# Patient Record
Sex: Female | Born: 1935
Health system: Southern US, Community
[De-identification: ages and names within clinical notes are randomized; demographics above are authoritative.]

## PROBLEM LIST (undated history)

## (undated) DIAGNOSIS — T4145XA Adverse effect of unspecified anesthetic, initial encounter: Secondary | ICD-10-CM

## (undated) DIAGNOSIS — K649 Unspecified hemorrhoids: Secondary | ICD-10-CM

## (undated) DIAGNOSIS — K219 Gastro-esophageal reflux disease without esophagitis: Secondary | ICD-10-CM

## (undated) DIAGNOSIS — K5792 Diverticulitis of intestine, part unspecified, without perforation or abscess without bleeding: Secondary | ICD-10-CM

## (undated) DIAGNOSIS — T8859XA Other complications of anesthesia, initial encounter: Secondary | ICD-10-CM

## (undated) DIAGNOSIS — I480 Paroxysmal atrial fibrillation: Secondary | ICD-10-CM

## (undated) DIAGNOSIS — F419 Anxiety disorder, unspecified: Secondary | ICD-10-CM

## (undated) DIAGNOSIS — E669 Obesity, unspecified: Secondary | ICD-10-CM

## (undated) DIAGNOSIS — I495 Sick sinus syndrome: Secondary | ICD-10-CM

## (undated) HISTORY — DX: Anxiety disorder, unspecified: F41.9

## (undated) HISTORY — PX: TONSILLECTOMY: SUR1361

## (undated) HISTORY — DX: Unspecified hemorrhoids: K64.9

## (undated) HISTORY — PX: APPENDECTOMY: SHX54

## (undated) HISTORY — DX: Sick sinus syndrome: I49.5

## (undated) HISTORY — DX: Gastro-esophageal reflux disease without esophagitis: K21.9

## (undated) HISTORY — DX: Obesity, unspecified: E66.9

## (undated) HISTORY — PX: KNEE SURGERY: SHX244

## (undated) HISTORY — PX: SHOULDER SURGERY: SHX246

## (undated) HISTORY — DX: Paroxysmal atrial fibrillation: I48.0

---

## 1971-03-18 HISTORY — PX: OTHER SURGICAL HISTORY: SHX169

## 1973-03-17 HISTORY — PX: ABDOMINAL HYSTERECTOMY: SHX81

## 1975-03-18 HISTORY — PX: BILATERAL TEMPOROMANDIBULAR JOINT ARTHROPLASTY: SUR77

## 1984-03-17 HISTORY — PX: OTHER SURGICAL HISTORY: SHX169

## 1994-03-17 HISTORY — PX: HERNIA REPAIR: SHX51

## 2002-04-07 ENCOUNTER — Encounter: Admission: RE | Admit: 2002-04-07 | Discharge: 2002-04-07 | Payer: Self-pay | Admitting: Orthopedic Surgery

## 2002-04-07 ENCOUNTER — Encounter: Payer: Self-pay | Admitting: Orthopedic Surgery

## 2002-05-25 ENCOUNTER — Ambulatory Visit (HOSPITAL_BASED_OUTPATIENT_CLINIC_OR_DEPARTMENT_OTHER): Admission: RE | Admit: 2002-05-25 | Discharge: 2002-05-25 | Payer: Self-pay | Admitting: Orthopedic Surgery

## 2002-05-30 ENCOUNTER — Emergency Department (HOSPITAL_COMMUNITY): Admission: EM | Admit: 2002-05-30 | Discharge: 2002-05-30 | Payer: Self-pay | Admitting: Emergency Medicine

## 2002-09-30 ENCOUNTER — Emergency Department (HOSPITAL_COMMUNITY): Admission: EM | Admit: 2002-09-30 | Discharge: 2002-09-30 | Payer: Self-pay | Admitting: *Deleted

## 2002-10-17 ENCOUNTER — Ambulatory Visit (HOSPITAL_COMMUNITY): Admission: RE | Admit: 2002-10-17 | Discharge: 2002-10-17 | Payer: Self-pay | Admitting: Family Medicine

## 2002-10-17 ENCOUNTER — Encounter: Payer: Self-pay | Admitting: Family Medicine

## 2002-10-20 ENCOUNTER — Encounter: Admission: RE | Admit: 2002-10-20 | Discharge: 2002-10-20 | Payer: Self-pay | Admitting: Family Medicine

## 2002-10-20 ENCOUNTER — Encounter: Payer: Self-pay | Admitting: Family Medicine

## 2004-10-12 ENCOUNTER — Inpatient Hospital Stay (HOSPITAL_COMMUNITY): Admission: EM | Admit: 2004-10-12 | Discharge: 2004-10-16 | Payer: Self-pay | Admitting: Internal Medicine

## 2004-10-15 ENCOUNTER — Encounter (INDEPENDENT_AMBULATORY_CARE_PROVIDER_SITE_OTHER): Payer: Self-pay | Admitting: *Deleted

## 2007-10-19 ENCOUNTER — Encounter: Admission: RE | Admit: 2007-10-19 | Discharge: 2007-10-19 | Payer: Self-pay | Admitting: Family Medicine

## 2008-02-13 ENCOUNTER — Emergency Department (HOSPITAL_COMMUNITY): Admission: EM | Admit: 2008-02-13 | Discharge: 2008-02-14 | Payer: Self-pay | Admitting: Emergency Medicine

## 2008-02-14 ENCOUNTER — Ambulatory Visit (HOSPITAL_COMMUNITY): Admission: RE | Admit: 2008-02-14 | Discharge: 2008-02-14 | Payer: Self-pay | Admitting: Emergency Medicine

## 2008-10-19 ENCOUNTER — Encounter: Admission: RE | Admit: 2008-10-19 | Discharge: 2008-10-19 | Payer: Self-pay | Admitting: Family Medicine

## 2009-02-04 ENCOUNTER — Inpatient Hospital Stay (HOSPITAL_COMMUNITY): Admission: EM | Admit: 2009-02-04 | Discharge: 2009-02-07 | Payer: Self-pay | Admitting: Emergency Medicine

## 2010-04-07 ENCOUNTER — Encounter: Payer: Self-pay | Admitting: Family Medicine

## 2010-06-19 LAB — HEMOGLOBIN A1C: Mean Plasma Glucose: 134 mg/dL

## 2010-06-19 LAB — BASIC METABOLIC PANEL
BUN: 11 mg/dL (ref 6–23)
BUN: 7 mg/dL (ref 6–23)
CO2: 20 mEq/L (ref 19–32)
Calcium: 7.8 mg/dL — ABNORMAL LOW (ref 8.4–10.5)
Chloride: 113 mEq/L — ABNORMAL HIGH (ref 96–112)
Chloride: 113 mEq/L — ABNORMAL HIGH (ref 96–112)
Creatinine, Ser: 1.01 mg/dL (ref 0.4–1.2)
Creatinine, Ser: 1.19 mg/dL (ref 0.4–1.2)
GFR calc Af Amer: 54 mL/min — ABNORMAL LOW (ref >60)
GFR calc Af Amer: >60 mL/min (ref >60)
GFR calc non Af Amer: 54 mL/min — ABNORMAL LOW (ref >60)
Glucose, Bld: 115 mg/dL — ABNORMAL HIGH (ref 70–99)
Potassium: 4.5 mEq/L (ref 3.5–5.1)
Potassium: 4.8 mEq/L (ref 3.5–5.1)
Sodium: 138 mEq/L (ref 135–145)

## 2010-06-19 LAB — RENAL FUNCTION PANEL
Albumin: 2.8 g/dL — ABNORMAL LOW (ref 3.5–5.2)
BUN: 15 mg/dL (ref 6–23)
CO2: 23 mEq/L (ref 19–32)
Calcium: 7.5 mg/dL — ABNORMAL LOW (ref 8.4–10.5)
Chloride: 111 mEq/L (ref 96–112)
Creatinine, Ser: 1.24 mg/dL — ABNORMAL HIGH (ref 0.4–1.2)
GFR calc Af Amer: 52 mL/min — ABNORMAL LOW (ref >60)
GFR calc non Af Amer: 43 mL/min — ABNORMAL LOW (ref >60)
Sodium: 137 mEq/L (ref 135–145)

## 2010-06-19 LAB — DIFFERENTIAL
Basophils Absolute: 0 10*3/uL (ref 0.0–0.1)
Eosinophils Relative: 0 % (ref 0–5)
Monocytes Relative: 5 % (ref 3–12)

## 2010-06-19 LAB — URINALYSIS, ROUTINE W REFLEX MICROSCOPIC
Glucose, UA: NEGATIVE mg/dL
Hgb urine dipstick: NEGATIVE
Leukocytes, UA: NEGATIVE
Specific Gravity, Urine: 1.028 (ref 1.005–1.030)
Urobilinogen, UA: 0.2 mg/dL (ref 0.0–1.0)

## 2010-06-19 LAB — POCT I-STAT, CHEM 8
BUN: 23 mg/dL (ref 6–23)
Calcium, Ion: 1.23 mmol/L (ref 1.12–1.32)
Creatinine, Ser: 1.5 mg/dL — ABNORMAL HIGH (ref 0.4–1.2)
Sodium: 142 mEq/L (ref 135–145)
TCO2: 23 mmol/L (ref 0–100)

## 2010-06-19 LAB — COMPREHENSIVE METABOLIC PANEL
ALT: 34 U/L (ref 0–35)
AST: 31 U/L (ref 0–37)
Albumin: 4.6 g/dL (ref 3.5–5.2)
Alkaline Phosphatase: 58 U/L (ref 39–117)
BUN: 24 mg/dL — ABNORMAL HIGH (ref 6–23)
Creatinine, Ser: 1.87 mg/dL — ABNORMAL HIGH (ref 0.4–1.2)
GFR calc non Af Amer: 27 mL/min — ABNORMAL LOW (ref >60)
Glucose, Bld: 161 mg/dL — ABNORMAL HIGH (ref 70–99)
Potassium: 3.7 mEq/L (ref 3.5–5.1)
Total Bilirubin: 0.6 mg/dL (ref 0.3–1.2)
Total Protein: 8.6 g/dL — ABNORMAL HIGH (ref 6.0–8.3)

## 2010-06-19 LAB — CBC
MCHC: 33.2 g/dL (ref 30.0–36.0)
MCHC: 34 g/dL (ref 30.0–36.0)
MCV: 96.4 fL (ref 78.0–100.0)
RBC: 5.08 MIL/uL (ref 3.87–5.11)
RDW: 13.7 % (ref 11.5–15.5)
WBC: 16.4 10*3/uL — ABNORMAL HIGH (ref 4.0–10.5)

## 2010-06-19 LAB — URINE MICROSCOPIC-ADD ON

## 2010-08-02 NOTE — Discharge Summary (Signed)
NAMEKAIDANCE, PANTOJA              ACCOUNT NO.:  [id number]   MEDICAL RECORD NO.:  [id number]          PATIENT TYPE:  INP   LOCATION:  5712                         FACILITY:  [facility]   PHYSICIAN:  Corinna L. Conley Canal, MDDATE OF BIRTH:  [birth year]   DATE OF ADMISSION:  10/12/2004  DATE OF DISCHARGE:  10/16/2004                                 DISCHARGE SUMMARY   DIAGNOSES:  1.  Right abdominal pain, improved.  2.  Mild right hydronephrosis without evidence of obstructing mass.  3.  Colitis at the biopsy of the anastomosis.  4.  History of anxiety disorder.  5.  History of peripheral edema.  6.  History of colectomy.   DISCHARGE MEDICATIONS:  She is to continue her Toprol, Paxil, triamterene.   FOLLOW UP:  Follow up with Dr. Jake Bathe in four to six weeks. Follow up with  Dr. Hessie Diener for further workup and treatment of her right  hydronephrosis.   CONDITION:  Stable.   ACTIVITY:  Ad lib.   DIET:  Low salt.   PERTINENT LABORATORY DATA:  UA negative for blood, nitrites or leukocyte  esterase. Complete metabolic panel unremarkable. Coagulation panel normal.  CBC normal. Hemoccult negative. Abdominal ultrasound showed no gallstones,  moderate hydronephrosis of the right kidney and 7 centimeter cyst in the  liver. CT of the abdomen showed status post partial colectomy, mild right  hydronephrosis without any obstructing stone or mass.   HISTORY AND HOSPITAL COURSE:  Ms. Genna was very pleasant 75 year old  white female patient of Dr. Bradd Burner who presented with right-sided abdominal  pain. She had no dysuria. Please see H&P for complete details. GI was  consulted and performed a flexible sigmoidoscopy to 18 cm. There was an  inflammation at the anastomosis site and this was biopsied. It showed  nonspecific colitis. The patient's abdominal pain resolved with conservative  therapy alone. In discussion with GI and urology, the patient could be  discharged to home and does not need  a  treatment for the nonspecific colitis but Dr. Terance Hart does recommend  outpatient workup of her hydronephrosis. At this point, it is still unclear  what caused her right-sided abdominal pain. Nevertheless, it has resolved  and the patient is stable to go home.      Corinna L. Conley Canal, MD  Electronically Signed     CLS/MEDQ  D:  10/20/2004  T:  10/20/2004  Job:  [id number]   cc:   Janifer Adie, M.D.  [place]  [place] [address]  Fax: [place] Terance Hart, M.D.  [place]. [address] [place]  [place] [address]  Fax: [phone]

## 2010-08-02 NOTE — H&P (Signed)
Rebecca Sandoval, Rebecca Sandoval              ACCOUNT NO.:  [id number]   MEDICAL RECORD NO.:  [id number]          PATIENT TYPE:  INP   LOCATION:  5712                         FACILITY:  [facility]   PHYSICIAN:  Domingo Madeira, MD   DATE OF BIRTH:  [birth year]   DATE OF ADMISSION:  10/12/2004  DATE OF DISCHARGE:                                HISTORY & PHYSICAL   HISTORY OF PRESENT ILLNESS:  Rebecca Sandoval is a 75 year old female who  started with right-sided abdominal at 1 p.m. today, achy with occasional  sharp, shooting pains.  This got progressively worse.  She is nauseated but  no vomiting.  She states that she has no similar issues before.  She has had  a bout of diverticulitis, but it is definitely not like that.  There has  been no change in her bowel habits.  She goes several times a day.  This is  not new since her colectomy from her diverticulitis.  No melena, no blood.  She has had some subjective fever and chills.  T-max is 99.8, increasing  pain with walking.   PAST MEDICAL HISTORY:  1.  Per patient, a fast heart rate.  2.  Lower extremity edema.  3.  Anxiety.   MEDICATIONS:  1.  Hydrochlorothiazide/triamterene 25/37.5 one p.o. daily.  2.  Paxil 20 mg one daily.  3.  Toprol XL 75 mg daily.   ALLERGIES:  None.   FAMILY HISTORY:  Hypertension, diabetes, and extensive cancers in the  family.   SOCIAL HISTORY:  The patient lives in [place], is married, has two  children.  No tobacco, alcohol, or illicit drug use.   PAST SURGICAL HISTORY:  1.  She has total TMJs.  2.  Bilateral shoulder surgery.  3.  Hiatal hernia repair.  4.  Diverticulitis surgery.  5.  Status post colectomy.  6.  Hysterectomy.  7.  Bartholin's cyst removal.   REVIEW OF SYSTEMS:  Please see HPI, otherwise all systems were negative when  reviewed with the patient.   PHYSICAL EXAMINATION:  VITAL SIGNS:  Temperature 98.2, blood pressure  148/78, pulse 73, respiratory rate 12, saturating 98%.  HEENT:  EOMI, no discharge.  Throat clear.  No erythema.  No exudate.  Mucosa  moist.  NECK:  Supple, no adenopathy, no JVD.  HEART:  Regular rate and rhythm.  No murmur, rubs, or gallops.  LUNGS:  Clear to auscultation bilaterally.  No CVA tenderness.  ABDOMEN:  Soft.  Hyperactive bowel sounds, right upper quadrant and the  right flank area had pain, some mild rebound tenderness.  No peritoneal  signs.  EXTREMITIES:  No edema, cyanosis, or clubbing.  NEUROLOGIC:  Patient was oriented x3.  Nonfocal.   LABORATORY DATA:  Labs from outside show white count of 15, hemoglobin of  15, hematocrit 45, platelets at 263.   X-ray of the chest did not show any active disease.  X-ray of the abdomen  showed some air fluid levels.   ASSESSMENT/PLAN:  1.  Abdominal pain, differential including partial small bowel obstruction,      ischemia, and questionable possible cholecystitis.  At this point n time      will check a CT of the abdomen and pelvis with oral and IV contrast,      contact surgery, check a CMET, n.p.o. except medications.  Continue to      observe the patient.  There is no need for NG tube at this point in      time.  The patient is not vomiting.  2.  History of peripheral edema.  Will hold on her      hydrochlorothiazide/triamterene.  3.  Anxiety.  Will continue with her Paxil.  4.  Prophylaxis.  Give her Lovenox subcu and Protonix daily.       AEJ/MEDQ  D:  10/12/2004  T:  10/12/2004  Job:  [id number]   cc:   Gwenyth Ober, M.D.  [address], [place]  [place] [address]

## 2010-08-02 NOTE — Op Note (Signed)
Rebecca Sandoval, Rebecca Sandoval              ACCOUNT NO.:  [id number]   MEDICAL RECORD NO.:  [id number]          PATIENT TYPE:  INP   LOCATION:  5712                         FACILITY:  [facility]   PHYSICIAN:  Wonda Horner, M.D.   DATE OF BIRTH:  20-Sep-1935   DATE OF PROCEDURE:  10/15/2004  DATE OF DISCHARGE:                                 OPERATIVE REPORT   PROCEDURE:  Flexible sigmoidoscopy with biopsy.   INDICATION:  [age]-year-old female with abdominal pain.   INFORMED CONSENT:  Informed consent was obtained after explanation of the  risks of bleeding, infection and perforation.   PREMEDICATION:  Fentanyl 50 mcg IV, Versed 5 mg IV.   PROCEDURE:  With the patient in the left lateral decubitus position, a  rectal exam was performed; no masses were felt.  The Olympus pediatric  colonoscope was inserted into the rectum and advanced up to 18 cm; at this  point, an anastomosis was seen.  This looked a little inflamed; it was  biopsied.  There were some diverticula distal to this in the remaining  sigmoid.  It appears that this patient has had a subtotaled colectomy.  No  other abnormalities were seen.  She tolerated the procedure well without  complications.   IMPRESSION:  1.  Diverticulosis.  2.  Anastomosis at 18 cm which looks a little inflamed; it was biopsied.       SFG/MEDQ  D:  10/15/2004  T:  10/16/2004  Job:  [id number]   cc:   Sheila Oats, M.D.

## 2010-08-02 NOTE — Op Note (Signed)
   NAME:  KHALEAH, DUER                        ACCOUNT NO.:  [id number]   MEDICAL RECORD NO.:  [id number]                   PATIENT TYPE:  AMB   LOCATION:  DSC                                  FACILITY:  [facility]   PHYSICIAN:  Yvette Rack., M.D.             DATE OF BIRTH:  [birth year]   DATE OF PROCEDURE:  DATE OF DISCHARGE:                                 OPERATIVE REPORT   PREOPERATIVE DIAGNOSIS:  1. Impingement, partial rotator cuff tear.  2. Torn anterior superior labrum.  3. Acromioclavicular arthritis.  4. Mild capsulitis.   POSTOPERATIVE DIAGNOSIS:  1. Impingement, partial rotator cuff tear.  2. Torn anterior superior labrum.  3. Acromioclavicular arthritis.  4. Mild capsulitis.   OPERATION PERFORMED:  1. Arthroscopic acromioplasty.  2. Arthroscopic debridement torn labrum.  3. Arthroscopic excision distal clavicle.  4. Closed manipulation.   SURGEON:  Lockie Pares, M.D.   ANESTHESIA:  General/block.   INDICATIONS FOR PROCEDURE:  Right shoulder pain with MRI proven cuff tear  thought to be amenable to outpatient arthroscopy with moderate capsulitis  thought to be amenable to outpatient manipulation with an overnight.   DESCRIPTION OF PROCEDURE:  The patient had very mild contractures with  limitation of external rotation and abduction and abduction but she was  gently manipulated.  There was not a severe capsulitis, however.  She was  arthroscoped in a posterolateral and anterior portal.  Systematic inspection  of the joint shows that the patient had a partial thickness tear about 30%  thickness of the supraspinatus.  This was debrided.  Degenerative type  tearing anterior superior labrum with debridement of the labrum.  No  glenohumeral changes.  The bursa was entered.  Debridement of the joint was  separate from the bursectomy carried out, very arthritic distal clavicle,  moderate impingement was noted.  Resection of the anterior leading edge of  the  acromion with a high speed bur, tapering gently posterior, leaving the  impingement nicely.  Distal centimeter to 1.5 cm of the clavicle was excised  in view of the acromioclavicular joint arthritis.  The superior surface of  the cuff did not show a complete tear.  Resection line  was checked from both the anterior and lateral portals.  Shoulder was closed  with nylon.  In view of the block, no Marcaine was used.  A lightly  compressive sterile dressing and sling was applied.  Taken to recovery room  in stable condition.                                                Yvette Rack., M.D.    WDC/MEDQ  D:  05/25/2002  T:  05/25/2002  Job:  [id number]

## 2010-08-02 NOTE — Consult Note (Signed)
Rebecca Sandoval, Rebecca Sandoval              ACCOUNT NO.:  [id number]   MEDICAL RECORD NO.:  [id number]          PATIENT TYPE:  INP   LOCATION:  5712                         FACILITY:  [facility]   PHYSICIAN:  John C. Amedeo Plenty, M.D.    DATE OF BIRTH:  05/28/1935   DATE OF CONSULTATION:  10/13/2004  DATE OF DISCHARGE:                                   CONSULTATION   REASON FOR CONSULTATION:  Abdominal pain.   Patient is a 75 year old white admitted with a fairly sudden onset of right  upper quadrant, right mid abdominal pain yesterday at about 1 p.m. while at  work.  She presented directly to the emergency room and had a normal  temperature, vital signs, white blood cell count, liver function test, plain  abdominal films and urinalysis but had significant tenderness with some  rebound in the right lower quadrant.  She underwent an abdominal CT scan  which showed previous partial colectomy, mild right hydronephrosis with no  sign of obstruction seen and pelvic CT was unremarkable.  It is not clear to  me whether she had a gallbladder ultrasound.  Her pain has been about 60%  improved from today to yesterday and she has not required pain medicines  today.   PAST MEDICAL HISTORY:  1.  Hiatal hernia.  2.  Anxiety.  3.  Degenerative joint disease.  4.  Diverticulitis.   SURGERIES:  1.  Hysterectomy in the 80s.  2.  Partial colectomy in the 80s for diverticulitis.  3.  Nissen fundoplication approximately 2000 with bilateral shoulder      surgery.  4.  Bilateral total TMJ repair.  5.  Bartholin's cyst removal.   MEDICATIONS ON ADMISSION:  Hydrochlorothiazide, Paxil, Toprol XL.   ALLERGIES:  NONE.   SOCIAL HISTORY:  Patient lives in [place].  She has 2 children.  She  denies alcohol or tobacco use.   PHYSICAL EXAM:  A well-developed, well-nourished white female in no acute  distress.  HEART:  Regular rate and rhythm without murmur.  LUNGS:  Clear.  ABDOMEN:  Soft, nondistended with  normoactive bowel sounds, no  hepatosplenomegaly or mass.  There is some tenderness in the right upper  quadrant but no rebound.   IMPRESSION:  Right sided abdominal pain, etiology unclear with negative  workup as outlined above.   PLAN:  Will confirm whether her gallbladder appeared normal on CT or was  adequately imaged and/or whether she had a gallbladder ultrasound and if not  will order one.  If no evidence of gallbladder disease, she has never had  full colonoscopy since her colectomy in the 1980s and would probably pursue  this.  Will obtain hemoccults in the meantime.       JCH/MEDQ  D:  10/13/2004  T:  10/13/2004  Job:  [id number]   cc:   Janifer Adie, M.D.  [place]  [place] [address]  Fax: [phone]

## 2010-12-04 ENCOUNTER — Ambulatory Visit (INDEPENDENT_AMBULATORY_CARE_PROVIDER_SITE_OTHER): Payer: 59 | Admitting: General Surgery

## 2010-12-17 ENCOUNTER — Encounter (INDEPENDENT_AMBULATORY_CARE_PROVIDER_SITE_OTHER): Payer: Self-pay | Admitting: General Surgery

## 2010-12-17 LAB — URINALYSIS, ROUTINE W REFLEX MICROSCOPIC
Bilirubin Urine: NEGATIVE
Glucose, UA: NEGATIVE mg/dL
Hgb urine dipstick: NEGATIVE
Ketones, ur: NEGATIVE mg/dL
Protein, ur: NEGATIVE mg/dL

## 2010-12-17 LAB — DIFFERENTIAL
Eosinophils Absolute: 0.1 10*3/uL (ref 0.0–0.7)
Eosinophils Relative: 1 % (ref 0–5)
Lymphocytes Relative: 21 % (ref 12–46)
Lymphs Abs: 2.1 10*3/uL (ref 0.7–4.0)
Monocytes Absolute: 0.6 10*3/uL (ref 0.1–1.0)

## 2010-12-17 LAB — URINE CULTURE: Colony Count: >=100000

## 2010-12-17 LAB — CBC
MCV: 95.4 fL (ref 78.0–100.0)
Platelets: 187 10*3/uL (ref 150–400)
RBC: 4.32 MIL/uL (ref 3.87–5.11)
WBC: 10.1 10*3/uL (ref 4.0–10.5)

## 2010-12-17 LAB — COMPREHENSIVE METABOLIC PANEL
ALT: 27 U/L (ref 0–35)
AST: 24 U/L (ref 0–37)
Albumin: 3.6 g/dL (ref 3.5–5.2)
CO2: 27 mEq/L (ref 19–32)
Calcium: 9.3 mg/dL (ref 8.4–10.5)
Chloride: 107 mEq/L (ref 96–112)
Creatinine, Ser: 1.13 mg/dL (ref 0.4–1.2)
GFR calc Af Amer: 58 mL/min — ABNORMAL LOW (ref >60)
Sodium: 139 mEq/L (ref 135–145)
Total Bilirubin: 0.5 mg/dL (ref 0.3–1.2)

## 2010-12-17 LAB — URINE MICROSCOPIC-ADD ON

## 2010-12-18 ENCOUNTER — Encounter (INDEPENDENT_AMBULATORY_CARE_PROVIDER_SITE_OTHER): Payer: Self-pay | Admitting: General Surgery

## 2010-12-18 ENCOUNTER — Ambulatory Visit (INDEPENDENT_AMBULATORY_CARE_PROVIDER_SITE_OTHER): Payer: 59 | Admitting: General Surgery

## 2010-12-18 VITALS — BP 140/94 | HR 64 | Temp 96.8°F | Resp 18 | Ht 66.5 in | Wt 199.5 lb

## 2010-12-18 DIAGNOSIS — K648 Other hemorrhoids: Secondary | ICD-10-CM

## 2010-12-18 DIAGNOSIS — K644 Residual hemorrhoidal skin tags: Secondary | ICD-10-CM

## 2010-12-18 DIAGNOSIS — L290 Pruritus ani: Secondary | ICD-10-CM

## 2010-12-18 NOTE — Progress Notes (Signed)
Subjective:     Patient ID: Rebecca Sandoval, female   DOB: [birth year], 75 y.o.   MRN: [id number]  HPIMrs. 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 He was hospitalized on a cone approximately year 4 months ago for  intractable nausea and vomiting and was evaluated with CT is an ultrasound of gallbladder did not show stones a flexible sigmoidoscopy mode with biopsy and this shows count of nonspecific colitis and her next low scissors of 18 cm per she said that he noted nausea and vomiting  Patient decided that he never felt that a definite diagnosis but she's, put up with the diarrhea is at sets been present ever a sensor total abdominal colectomy. She does have a rapid heartbeat for which he is on a beta blocker and she's never had any angina. She said her last chest x-ray and EKG was when she was in the hospital which is over a year ago.   Review of Systems  Current Outpatient Prescriptions  Medication Sig Dispense Refill  . magnesium gluconate (MAGONATE) 30 MG tablet Take 30 mg by mouth 2 (two) times daily.        . metoprolol (TOPROL-XL) 100 MG 24 hr tablet Take 50 mg by mouth daily.       Marland Kitchen PARoxetine (PAXIL) 20 MG tablet Take 20 mg by mouth every morning.        . potassium chloride (MICRO-K) 10 MEQ CR capsule Take 10 mEq by mouth 2 (two) times daily.        Marland Kitchen triamterene-hydrochlorothiazide (MAXZIDE-25) 37.5-25 MG per tablet Take 1 tablet by mouth daily.         Past Surgical History  Procedure Date  . Abdominal hysterectomy 1975  . Bartholyn cyst 1973  . Bilateral temporomandibular joint arthroplasty 1977    . Diverticulitis 1986  . Shoulder surgery     left 97, rigth 98  . Hernia repair 1996    hiatal   No Known Allergies  Patient denies ever having angina she doesn't have shortness of breath she has a small hiatus hernia her CT of the lobe of the mid esophageal reflux. She cannot for certain as to cover increased diarrhea up    Objective:   Physical ExamBP 140/94  Pulse 64  Temp 96.8 F (36 C)  Resp 18  Ht 5' 6.5" (1.689 m)  Wt 199 lb 8 oz (90.493 kg)  BMI 31.72 kg/m2 Patient is in no acute distress and is quite cooperative lungs are clear there's no wheezing normal heart size and did not do a breast exam and was nontender there's a midline incision and she says she was reoperated on for caliber hiatus hernia far 6 years after her colorectal surgery on rectal exam she's got a lot appearing ascending she's got a lot of external hemorrhoidal tags and anoscopic exam she's got significant internal and external hemorrhoid in 3 quadrants and really needs a formal internal and external hemorrhoidectomy. I would recommend that we do this as an outpatient since she's got a vascular short colon of a just a dose of milk of magnesia or possibly  clear liquids from lunch or one the day before surgery would be all she needs it she's never bothered with constipation. I would like to treat her with Mycolog cream to see if we can get this. His pain improves and do that for a couple weeks and then proceed on with an internal and external hemorrhoidectomy. She is aware that she'll be off work probably 2 weeks maybe 3 weeks before she is actually pain free and says his hemorrhoids which is getting worse and wants to proceed on. I think I will do a proctoscopy exam at the time of surgery since she's had a previous noncystic colitis and noted infected and there is nothing additionally from the thigh and appear LI family history of colon cancer worse her father her sister appear     Assessment:    That  internal/external hemorrhoids and a lot of hemorrhoidal tags and I significant perianal S. irritation from the chronic diarrhea    Plan:     2 weeks of Mycolog cream to 3 times a day and we will plan a formal internal and external hemorrhoidectomy as an outpatient and with a lump in the lithotomy position she had chest x-ray post laboratory studies when she was hospitalized for 60 months ago there was no evidence of anemia or really infection noted definite. Thank you

## 2010-12-18 NOTE — Patient Instructions (Signed)
Will start was treated in the pruritis ani with Mycolog cream applied b.i.d. after cleansing and and plan on formal internal and external hemorrhoidectomy in 2-3 weeks. We'll do this as an outpatient at [facility]

## 2010-12-20 ENCOUNTER — Other Ambulatory Visit (INDEPENDENT_AMBULATORY_CARE_PROVIDER_SITE_OTHER): Payer: Self-pay | Admitting: General Surgery

## 2010-12-20 ENCOUNTER — Encounter (HOSPITAL_COMMUNITY): Payer: Medicare Other

## 2010-12-20 ENCOUNTER — Ambulatory Visit (HOSPITAL_COMMUNITY)
Admission: RE | Admit: 2010-12-20 | Discharge: 2010-12-20 | Disposition: A | Discharge status: Home / Self Care | Payer: Medicare Other | Source: Ambulatory Visit | Attending: General Surgery | Admitting: General Surgery

## 2010-12-20 ENCOUNTER — Ambulatory Visit (INDEPENDENT_AMBULATORY_CARE_PROVIDER_SITE_OTHER): Payer: 59 | Admitting: General Surgery

## 2010-12-20 DIAGNOSIS — Z87891 Personal history of nicotine dependence: Secondary | ICD-10-CM | POA: Insufficient documentation

## 2010-12-20 DIAGNOSIS — Z01812 Encounter for preprocedural laboratory examination: Secondary | ICD-10-CM | POA: Insufficient documentation

## 2010-12-20 DIAGNOSIS — Z01818 Encounter for other preprocedural examination: Secondary | ICD-10-CM | POA: Insufficient documentation

## 2010-12-20 DIAGNOSIS — Z01811 Encounter for preprocedural respiratory examination: Secondary | ICD-10-CM

## 2010-12-20 DIAGNOSIS — K649 Unspecified hemorrhoids: Secondary | ICD-10-CM | POA: Insufficient documentation

## 2010-12-20 LAB — CBC
HCT: 40.3 % (ref 36.0–46.0)
MCH: 31.5 pg (ref 26.0–34.0)
MCHC: 33.3 g/dL (ref 30.0–36.0)
MCV: 94.6 fL (ref 78.0–100.0)
RDW: 12.8 % (ref 11.5–15.5)

## 2010-12-20 LAB — BASIC METABOLIC PANEL
BUN: 18 mg/dL (ref 6–23)
Calcium: 9.4 mg/dL (ref 8.4–10.5)
Creatinine, Ser: 1.39 mg/dL — ABNORMAL HIGH (ref 0.50–1.10)
GFR calc Af Amer: 42 mL/min — ABNORMAL LOW (ref >90)
GFR calc non Af Amer: 37 mL/min — ABNORMAL LOW (ref >90)

## 2010-12-20 LAB — DIFFERENTIAL
Eosinophils Absolute: 0.1 10*3/uL (ref 0.0–0.7)
Eosinophils Relative: 2 % (ref 0–5)
Lymphocytes Relative: 37 % (ref 12–46)
Lymphs Abs: 2.8 10*3/uL (ref 0.7–4.0)
Monocytes Absolute: 0.7 10*3/uL (ref 0.1–1.0)

## 2010-12-24 ENCOUNTER — Ambulatory Visit (HOSPITAL_COMMUNITY)
Admission: RE | Admit: 2010-12-24 | Discharge: 2010-12-24 | Disposition: A | Discharge status: Home / Self Care | Payer: Medicare Other | Source: Ambulatory Visit | Attending: General Surgery | Admitting: General Surgery

## 2010-12-24 ENCOUNTER — Other Ambulatory Visit (INDEPENDENT_AMBULATORY_CARE_PROVIDER_SITE_OTHER): Payer: Self-pay | Admitting: General Surgery

## 2010-12-24 DIAGNOSIS — K644 Residual hemorrhoidal skin tags: Secondary | ICD-10-CM | POA: Insufficient documentation

## 2010-12-24 DIAGNOSIS — I471 Supraventricular tachycardia, unspecified: Secondary | ICD-10-CM | POA: Insufficient documentation

## 2010-12-24 DIAGNOSIS — K648 Other hemorrhoids: Secondary | ICD-10-CM | POA: Insufficient documentation

## 2010-12-24 DIAGNOSIS — Z79899 Other long term (current) drug therapy: Secondary | ICD-10-CM | POA: Insufficient documentation

## 2010-12-24 DIAGNOSIS — E669 Obesity, unspecified: Secondary | ICD-10-CM | POA: Insufficient documentation

## 2010-12-24 DIAGNOSIS — I10 Essential (primary) hypertension: Secondary | ICD-10-CM | POA: Insufficient documentation

## 2010-12-28 ENCOUNTER — Emergency Department (HOSPITAL_COMMUNITY): Payer: Medicare Other

## 2010-12-28 ENCOUNTER — Emergency Department (HOSPITAL_COMMUNITY)
Admission: EM | Admit: 2010-12-28 | Discharge: 2010-12-28 | Disposition: A | Discharge status: Home / Self Care | Payer: Medicare Other | Attending: Internal Medicine | Admitting: Internal Medicine

## 2010-12-28 DIAGNOSIS — I4891 Unspecified atrial fibrillation: Secondary | ICD-10-CM | POA: Insufficient documentation

## 2010-12-28 DIAGNOSIS — Z79899 Other long term (current) drug therapy: Secondary | ICD-10-CM | POA: Insufficient documentation

## 2010-12-28 DIAGNOSIS — R0989 Other specified symptoms and signs involving the circulatory and respiratory systems: Secondary | ICD-10-CM | POA: Insufficient documentation

## 2010-12-28 DIAGNOSIS — I10 Essential (primary) hypertension: Secondary | ICD-10-CM | POA: Insufficient documentation

## 2010-12-28 DIAGNOSIS — R0609 Other forms of dyspnea: Secondary | ICD-10-CM | POA: Insufficient documentation

## 2010-12-28 DIAGNOSIS — K5730 Diverticulosis of large intestine without perforation or abscess without bleeding: Secondary | ICD-10-CM | POA: Insufficient documentation

## 2010-12-28 LAB — COMPREHENSIVE METABOLIC PANEL
Albumin: 3.7 g/dL (ref 3.5–5.2)
BUN: 15 mg/dL (ref 6–23)
CO2: 25 mEq/L (ref 19–32)
Chloride: 98 mEq/L (ref 96–112)
Creatinine, Ser: 1.17 mg/dL — ABNORMAL HIGH (ref 0.50–1.10)
GFR calc non Af Amer: 45 mL/min — ABNORMAL LOW (ref >90)
Total Bilirubin: 0.2 mg/dL — ABNORMAL LOW (ref 0.3–1.2)

## 2010-12-28 LAB — CBC
HCT: 40.5 % (ref 36.0–46.0)
MCV: 91.8 fL (ref 78.0–100.0)
RDW: 12.9 % (ref 11.5–15.5)
WBC: 9.8 10*3/uL (ref 4.0–10.5)

## 2010-12-28 LAB — POCT I-STAT TROPONIN I
Troponin i, poc: 0.01 ng/mL (ref 0.00–0.08)
Troponin i, poc: 0.01 ng/mL (ref 0.00–0.08)

## 2010-12-28 LAB — DIFFERENTIAL
Eosinophils Relative: 2 % (ref 0–5)
Lymphocytes Relative: 23 % (ref 12–46)
Lymphs Abs: 2.2 10*3/uL (ref 0.7–4.0)
Monocytes Absolute: 0.8 10*3/uL (ref 0.1–1.0)
Monocytes Relative: 8 % (ref 3–12)

## 2010-12-28 NOTE — Discharge Summary (Signed)
NAMEVALERY, Rebecca Sandoval NO.:  [id number]  MEDICAL RECORD NO.:  [id number]  LOCATION:  WLED                         FACILITY:  [facility]  PHYSICIAN:  Jacquelynn Cree, M.D.   DATE OF BIRTH:  [birth year]  DATE OF ADMISSION:  12/28/2010 DATE OF DISCHARGE:  12/28/2010                              DISCHARGE SUMMARY   PRIMARY CARE PHYSICIAN:  Dr. Grayland Ormond.  CARDIOLOGIST:  Belva Crome, M.D.  DISCHARGE DIAGNOSES: 1. Atrial fibrillation with rapid ventricular response. 2. Transient bradycardia. 3. Hypertension. 4. History of diverticulosis status post partial colectomy. 5. History of ventricular tachycardia. 6. Anxiety. 7. Hiatal hernia. 8. Degenerative joint disease. 9. Recent hemorrhoidectomy.  DISCHARGE MEDICATIONS: 1. Toprol 50 mg p.o. tonight only then discontinue. 2. Cardizem CD 240 mg p.o. starting December 29, 2010. 3. Hydrocodone APAP 5/500 one tab p.o. q.6 hours p.r.n. pain. 4. Maxzide 37.5/25 one tablet p.o. daily. 5. Paroxetine 20 mg p.o. daily.  CONSULTATIONS:  Jerline Pain, MD of Cardiology.  BRIEF ADMISSION HISTORY OF PRESENT ILLNESS:  The patient is a 75 year old female, who presented to the hospital with a chief complaint of palpitations and dizziness accompanied by diaphoresis and presyncope. She presented to the hospital where she was found to be in atrial fibrillation with rapid ventricular response.  She converted back to normal sinus rhythm.  But because of her tachyarrhythmia, was referred to the hospitalist service for further evaluation and treatment.  For the full details, please see my dictated H and P.  PROCEDURES AND DIAGNOSTIC STUDIES:  Chest x-ray on December 28, 2010, showed low volume film without acute cardiopulmonary process.  DISCHARGE LABORATORY VALUES:  Please see the dictated H and P for her CMP and CBC results.  Troponins were negative x2.  HOSPITAL COURSE BY PROBLEM: 1. Atrial fibrillation with rapid  ventricular response:  The patient     was admitted for observation and put on a Cardizem drip by the     emergency department physician.  By the time I evaluated her, she     was bradycardic in normal sinus rhythm.  A cardiology consultation     was requested and kindly provided by Dr. Marlou Porch who felt that the     patient could safely be discharged home.  He recommended that she     take her dose of metoprolol tonight but then transition over to     long-acting Cardizem in the morning since she converted rapidly     after being put on a Cardizem drip.  He has arranged for her to be     seen in the clinic in follow up for further evaluation.  Of note,     her troponins were negative. 2. Hypertension:  The patient will continue on     triamterene/hydrochlorothiazide and metoprolol tonight followed by     Cardizem in the morning.  The patient's other medical problems remained stable.  DISPOSITION:  The patient was medically stable and recommended to be discharged from the emergency department.  Time spent on discharge including face-to-face time equals less than 30 minutes.  Jacquelynn Cree, M.D.     CR/MEDQ  D:  12/28/2010  T:  12/28/2010  Job:  [id number]  cc:   Dr. Alric Seton, M.D. Fax: [phone]  Electronically Signed by Jacquelynn Cree M.D. on 12/28/2010 09:24:55 PM

## 2010-12-28 NOTE — H&P (Signed)
Rebecca Sandoval, KILLILEA NO.:  [id number]  MEDICAL RECORD NO.:  [id number]  LOCATION:  WLED                         FACILITY:  [facility]  PHYSICIAN:  Jacquelynn Cree, M.D.   DATE OF BIRTH:  05-25-1935  DATE OF ADMISSION:  12/28/2010 DATE OF DISCHARGE:                             HISTORY & PHYSICAL   PRIMARY CARE PHYSICIAN:  Dr. Grayland Sandoval.  CARDIOLOGIST:  Belva Crome, M.D.  CHIEF COMPLAINT:  Elevated heart rate, weakness, dizziness.  HISTORY OF PRESENT ILLNESS:  The patient is a 75 year old female followed by Dr. Tamala Julian for tachyarrhythmias, who presents to the hospital with a chief complaint of palpitations, dizziness, and feeling weak. The patient reports that she underwent hemorrhoidectomy approximately 4 days ago and was doing well postoperatively.  She was eating breakfast when she had the sudden onset of palpitations.  She states that she has had palpitations in the past and Dr. Tamala Julian has told her that when she feels this way to check her pulse rate and if it is ever greater than 120 beats per minute, to call his office or to come to the emergency department if it is after hours.  The patient took her pulse rate and it was found to be in the 130s-140s and therefore she came to the hospital for evaluation.  The palpitations were not brought on by exertion, however, they were associated with nausea and diaphoresis.  She denies any associated chest pain.  She has not had any fever or chills.  Upon initial evaluation in the emergency department, the patient was found to be in atrial fibrillation with rapid ventricular response.  A and subsequently was referred to the hospitalist service for further evaluation and treatment.  PAST MEDICAL HISTORY: 1. Hypertension. 2. Diverticulosis status post partial colectomy. 3. History of ventricular tachycardia. 4. Anxiety. 5. Hiatal hernia. 6. Degenerative joint disease. 7. Atrial  fibrillation/tachyarrhythmia. 8. Hemorrhoids status post hemorrhoidectomy.  PAST SURGICAL HISTORY: 1. Bartholin cyst removal. 2. Hemorrhoidectomy. 3. Hysterectomy. 4. Partial colectomy. 5. Nissen fundoplication. 6. Bilateral shoulder surgeries. 7. Bilateral TMJ repair.  FAMILY HISTORY:  Patient's mother died at 57 from a massive stroke.  She was also hypertensive.  Patient's father died at 50 from organ failure. She has 1 healthy brother and 2 offspring.  One of her children died from medication overdose.  SOCIAL HISTORY:  The patient is married x44 years.  She quit smoking 20 years ago but prior to this had a 2 pack per week habit.  She is currently employed as a Electrical engineer.  She very rarely drinks alcohol and does not use street drugs.  ALLERGIES:  No known drug allergies.  CURRENT MEDICATIONS: 1. Hydrocodone APAP 5/500 one tab p.o. q.6 hours p.r.n. pain. 2. Maxzide 37.5/25 1 tab p.o. daily. 3. Paroxetine 20 mg p.o. daily. 4. Metoprolol-XL 50 mg p.o. b.i.d.  REVIEW OF SYSTEMS:  CONSTITUTIONAL:  No fever or chills.  No weight loss.  Appetite is good.  HEENT:  No complaints.  CARDIOVASCULAR:  Chest pain.  Positive for arrhythmia.  RESPIRATORY:  Shortness of breath or cough.  GI:  No nausea, vomiting, chronic diarrhea but no melena or hematochezia.  GU:  No dysuria or hematuria.  MUSCULOSKELETAL: Arthritic type aches, but no other complaints.  A comprehensive 14-point review of systems is otherwise unremarkable.  PHYSICAL EXAMINATION:  VITAL SIGNS:  Temperature 98.3, pulse 73, respirations 23, blood pressure 146/84, O2 saturation 95% on 2 L. GENERAL:  Well-developed, well-nourished female no acute distress. HEENT:  Normocephalic, atraumatic.  PERRL.  EOMI.  Oropharynx is clear. NECK:  Supple, no thyromegaly, no lymphadenopathy, no jugular venous distention.  CHEST:  Lungs clear to auscultation bilaterally with good air movement.  No rales or rhonchi. HEART:   Currently in normal sinus rhythm with a regular rate.  Mildly bradycardic.  No murmurs, rubs, or gallops. ABDOMEN:  Soft, nontender, nondistended with normoactive bowel sounds. EXTREMITIES:  No clubbing, edema, or cyanosis.  Dorsalis pedis pulses 2+.  Skin:  Dry.  No rashes. NEUROLOGIC:  The patient is alert and oriented x3.  Cranial nerves 2-12 grossly intact.  Nonfocal.  DATA REVIEW:  Chest x-ray shows low volume film without acute pulmonary process.  LABORATORY DATA:  Sodium is 138, potassium 3.7, chloride 98, bicarb 25, BUN 15, creatinine 1.17, glucose 136, total bilirubin 0.2, alkaline phosphatase 54, AST 22, ALT 37, total protein 7.7, albumin 3.7, calcium 10.4.  White blood cell count is 9.8, hemoglobin 14, hematocrit 40.5, platelets 235  ASSESSMENT/PLAN: 1. Paroxysmal atrial fibrillation with rapid ventricular response:  We     will admit the patient for observation.  I have her ready spoke Dr.     Paulla Fore of cardiology who will consult on the patient.  He     does recommend withdrawal of the patient's beta-blocker and     initiation of Cardizem for control of these paroxysms of atrial fib.  At this     time, he does not recommend that we place the patient on Coumadin.     However, his further recommendations will be forthcoming.  The     patient has converted from AFib with rapid ventricular response to     bradycardia in a normal sinus rhythm on the Cardizem drip and I     have asked the nurses to discontinue the drip.  We will start her     on Cardizem 60 mg p.o. q.6 hours with holding parameters.  I will     also check her thyroid function, and cycle cardiac markers q.6     hours x3 sets.  If her heart rate is stable, she can likely be     discharged in the morning. 2. Stage 2 to 3 chronic kidney disease:  Patient's baseline creatinine     is 1.1-1.39.  This is consistent with a reduced GFR and stage 3     chronic kidney disease.  Would recommend outpatient follow  up. 3. Hypertension:  We will monitor the patient's blood pressure and     continue her on her Maxzide therapy for now.  Her electrolytes were     stable. 4. Status post hemorrhoidectomy:  Continue patient's pain control     measures. 5. Prophylaxis:  We will use PAS hoses for DVT prophylaxis given her     recent surgery and risk of bleeding.  Time spent on admission including face-to-face time equals approximately 1 hour.     Jacquelynn Cree, M.D.     CR/MEDQ  D:  12/28/2010  T:  12/28/2010  Job:  [id number]  cc:   Belva Crome, M.D. Fax: [phone]  Dr. Grayland Sandoval.  Electronically Signed by Shari Prows.D.  on 12/28/2010 05:56:15 PM

## 2010-12-28 NOTE — Consult Note (Signed)
NAMENEHA, WAIGHT NO.:  [id number]  MEDICAL RECORD NO.:  [id number]  LOCATION:  WLED                         FACILITY:  [facility]  PHYSICIAN:  Jerline Pain, MD      DATE OF BIRTH:  1935-08-03  DATE OF CONSULTATION:  12/28/2010 DATE OF DISCHARGE:                                CONSULTATION   CARDIOLOGIST:  Dr. Daneen Schick.  Consultation at the request of Dr. Margreta Journey Rama of [facility] for the evaluation of paroxysmal atrial fibrillation.  Ms. Nissan is a 75 year old female with a long-standing history of paroxysmal atrial fibrillation which occur in short bursts, usually lasting less than 4 hours duration with hypertension.  Here in the [facility] emergency department, with atrial fibrillation, rapid ventricular response.  This started at 9:30 this morning and she had been told in the past, and if she has rapid episodes and if the office was not open, to come to the emergency department for further evaluation and management.  She stated that she did not feel dizzy or had any syncopal episodes.  However, she did feel slightly diaphoretic during this rapid atrial fibrillation episode.  She has been having these episodes on a more frequent basis recently, but they have been present for several years.  Dr. Tamala Julian has placed up to 3 monitor she states in the past on her.  He has also increased her Toprol to maximal allowable dose given her resting bradycardia.  For instance, her resting heart rate during her recent hemorrhoidectomy 4 days ago was 45 in the PACU. Dr. Tamala Julian has told her she states that if she continues to have episodes that she may need to see Electrophysiology.  When she arrived here in the emergency department, her atrial fibrillation was confirmed by EKG and she was given an IV bolus of diltiazem, which shortly thereafter, she converted to sinus bradycardia. I saw her approximately 4 hours after conversion and she is feeling in her  usual state of health without any chest discomfort, diaphoresis, shortness of breath, fevers, or chills.  She does have some chronic discomfort after her hemorrhoidectomy.  No recent rashes.  No recent amphetamine use.  No alcohol use.  She is a nonsmoker, nondiabetic.  No prior strokes.  No peripheral vascular disease and no prior history of congestive heart failure.  PAST MEDICAL HISTORY:  Anxiety, partial colectomy in the 1980 secondary to diverticulosis, Nissen fundoplication in 7867, bilateral shoulder repairs, hysterectomy in the 1980s, bilateral TMJ repair, abdominal pain, recent hemorrhoidectomy, and hypertension, paroxysmal atrial fibrillation.  SOCIAL HISTORY:  She is a nondrinker, nonsmoker.  Married.  Lives in [place], 2 children.  FAMILY HISTORY:  Neither her mother or father had atrial fibrillation. No early family history of coronary artery disease detected.  ALLERGIES:  No known drug allergies.  MEDICATIONS AT HOME:  She is taking Toprol-XL 50 mg twice a day.  She is also taking Vicodin as needed, Maxzide, triamterene 37.5/25 every morning, and paroxetine 20 mg every morning.  REVIEW OF SYSTEMS:  Unless specified above, all other 12 review of systems negative.  PHYSICAL EXAM:  VITAL SIGNS:  Blood pressure currently 120/70, previously 146/84. GENERAL:  Alert and oriented  x3, in no acute distress, overweight female, sitting in bed, comfortable, next her husband is in the room. EYES:  Well-perfused conjunctivae.  EOMI.  No scleral icterus. NECK:  Supple.  No lymphadenopathy.  No thyromegaly.  No carotid bruits. No JVD. CARDIOVASCULAR:  Bradycardic, regular rhythm, without any appreciable murmurs, rubs, or gallops.  Normal PMI. LUNGS:  Clear to auscultation bilaterally.  Normal respiratory effort. No wheezes.  No rales. ABDOMEN:  Soft, nontender, normoactive bowel sounds.  No appreciable hepatosplenomegaly or overweight.  EXTREMITIES:  No clubbing, cyanosis, or  edema.  Palpable distal pulses. GU:  Deferred. RECTAL:  Deferred (recent hemorrhoidectomy). NEUROLOGIC:  Nonfocal. SKIN:  Warm, dry, and intact.  No rashes.  DATA:  Point-of-care cardiac marker normal.  Creatinine is 1.17, potassium 3.7.  White count 9.8, hemoglobin 14.0, platelets 235.  Chest x-ray, no acute disease, personally viewed, prior surgical clips noted throughout left upper quadrant.  EKG currently demonstrating sinus bradycardia, rate 48-55 on telemetry with no acute ST-segment changes. Prior EKG showed atrial fibrillation with rapid ventricular response, heart rate of 108 beats per minute.  Second point-of-care troponin is currently pending.  ASSESSMENT AND PLAN:  A 75 year old female with paroxysmal atrial fibrillation, increasing frequency, hypertension. 1. Atrial fibrillation - unable to increase the Toprol because of     resting bradycardia which is asymptomatic.  Because she did convert     quickly after administration of IV bolus diltiazem, I would try     transition from Toprol over to Cardizem long-acting to see if this     may help her decrease the overall frequency of her episodes of     paroxysmal atrial fibrillation which usually last less than 4 hours     duration.  What I will do is ask her to take her Toprol tonight and     then tomorrow, take Cardizem 240 mg CD once a day.  We will get her     back into the clinic in mid week to see Dr. Rometta Emery,     nurse practitioner for EKG and for overall assessment of symptoms.     If Cardizem does not seem to decrease the frequency, one may wish     to utilize dronedarone or Multaq for instance as an anti-arrhythmic     to try to see if we can help alleviate the frequent bouts of these     at atrial fibrillation episodes.  I do not believe that she has had     a recent stress evaluation, and if she does, have no evidence of     coronary artery disease or ischemia when may wish to utilize a     class 1  antiarrhythmic such as flecainide or propafenone.  If     echocardiogram is not been recently done, we may consider doing     this as well in the clinic setting.  If her point-of-care troponin     x2 is normal, I feel comfortable allowing her to be discharged     from the emergency division with close followup. 2. Hypertension - continue with triamterene/hydrochlorothiazide.  I     have discussed this with Dr. Rockne Menghini of [facility].     Jerline Pain, MD     MCS/MEDQ  D:  12/28/2010  T:  12/28/2010  Job:  [id number]  cc:   Belva Crome, M.D. Fax: [phone]

## 2011-01-01 ENCOUNTER — Encounter (INDEPENDENT_AMBULATORY_CARE_PROVIDER_SITE_OTHER): Payer: Self-pay | Admitting: General Surgery

## 2011-01-01 ENCOUNTER — Ambulatory Visit (INDEPENDENT_AMBULATORY_CARE_PROVIDER_SITE_OTHER): Payer: Medicare Other | Admitting: General Surgery

## 2011-01-01 DIAGNOSIS — K649 Unspecified hemorrhoids: Secondary | ICD-10-CM

## 2011-01-01 NOTE — Op Note (Signed)
[name], [name]              ACCOUNT NO.:  [id number]  MEDICAL RECORD NO.:  [id number]  LOCATION:  DAYL                         FACILITY:  [facility]  PHYSICIAN:  Orson Ape. Elsie Baynes, M.D.DATE OF BIRTH:  10-18-1935  DATE OF PROCEDURE:  12/24/2010 DATE OF DISCHARGE:  12/24/2010                              OPERATIVE REPORT   PREOPERATIVE DIAGNOSIS:  Internal and external hemorrhoids.  OPERATION:  Internal and external hemorrhoidectomy, 3 quadrants, complex.  HISTORY:  Rebecca Sandoval is a 75 year old female who, approximately I think 10 years ago, had a total intraabdominal colectomy.  The patient had multiple diverticulosis and the anastomosis was at approximately 18 cm.  Since then, she has had significant problems with bowel movements up to 7 or 8 a day, is able to counter-manage this with her diet, and I saw her in the office approximately 2 weeks ago after she was referred because of pain and bleeding hemorrhoids.  On examination in the office, she has basically some hemorrhoidal tags that are protruding.  She has got pretty significant internal and external hemorrhoidectomy, and I recommended that a formal internal and external hemorrhoidectomy be performed.  She had a flexible sigmoidoscopy and the anastomosis visualized at 18 cm, approximately 3 or 4 months ago when she was in the hospital over at [facility].  She is here for the planned procedure; and on examination today, there has been no change.  She did have a pretty significant perianal irritation and that has been treated with Mycolog cream and that is greatly improved.  She is given 3 g of Unasyn preoperatively and permission obtained for the surgery.  There has been no change in her otherwise physical examination.  The patient's electrolytes are normal and her hematocrit is 40 with a white count of 7500.  PROCEDURE IN DETAIL:  The patient was taken to the operative suite, induction of general anesthesia and  endotracheal tube positioned, and then the patient was placed up in the [place].  She had been slipped down on the table and she has got significant hemorrhoids in this position in all 3 quadrants.  The perianal area and the rectum were prepped with Betadine surgical scrub and solution, and draped in a sterile manner.  After the prep, the first thing I anesthetized the perianal sphincter area with approximately 10 cc, to each buttocks, of 0.5% Marcaine with adrenaline and then did not vigorously dilate her anus since she has such problems with loose bowel movements.  I draped her in a sterile manner, the time-out had been completed.  At this time, I elected to do the anterior to the left hemorrhoid, which was a larger hemorrhoid.  The actual largest was the posterior right and the hemorrhoid was elevated from the internal sphincter.  Buie clamp was under clamped.  There were numerous little vessels that were sutured with 3-0 chromic and then the pedicle itself after the hemorrhoid had been under clamped with Buie clamp, was removed.  I then put some U stitches of 2-0 chromic and then sutured over the start of the hemorrhoid clamp and then actually closed the external portion with this running 2-0 chromic.  She was still having a little  bit of bleeding at the apex area and I used a figure-of-8 of 2-0 Vicryl just because of the large size of her hemorrhoids and little better hemostasis.  Next, the right posterolateral with the large tag was removed and this was managed in a similar manner except I used the U stitch as a 2-0 Vicryl and the little pedicle with that and then the 2-0 chromic after some little 3-0 chromic right on the mucosal edges, etc.  Next, the smaller of the hemorrhoid was the left lateral side and this was elevated, did not use a Buie clamp, but used a 2-0 chromic apex hemostasis and then closed the incision with this running 2-0 chromic.  The patient tolerated  the procedure nicely.  I looked at all 3 suture lines with the diagnostic anoscope.  I put a little figure-of-8 in the little hemorrhoid on the right lateral side with 3-0 chromic and then placed 5% lidocaine ointment up in the anal canal and an open 4 x 4 to kind of hold this in place.  The patient tolerated the procedure nicely, and she wants to go home later today if she is able to tolerate liquids without problems and she will remove the little packing this evening and I would like to see her in the office in approximately 1 to 2 weeks.  We will continue to manage her diet as she has been because of the chronic frequent diarrhea with the total intraabdominal colectomy that has been previously performed.     Orson Ape. Rise Patience, M.D.     WJW/MEDQ  D:  12/24/2010  T:  12/24/2010  Job:  [id number]  Electronically Signed by Jeanella Anton M.D. on 01/01/2011 08:53:45 AM

## 2011-01-01 NOTE — Progress Notes (Signed)
Subjective:     Patient ID: Rebecca Sandoval, female   DOB: [birth year], 75 y.o.   MRN: [id number]  HPIPatient returns now she's chronic 7 days after internal/external hemorrhoidectomy as an outpatient as she's not had significant pain with a hemorrhoid and listen to all the patient's that she did have an episode of arrhythmia also for hormonal Saturday and was name abductus thanks female he is made some adjustments in her chronic cardiac medications. She said she was taken about only one or 2 Vicodin over 24-hour and using a lidocaine ointment for most of her pain really. She is having bowel movements without problems and says she's getting, semi-formed bowel movements at this time   Review of Systems No change    Objective:   Physical ExamBP 118/76  Pulse 60  Temp 97.1 F (36.2 C)  Resp 16  Ht 5' 6.5" (1.689 m)  Wt 195 lb 4 oz (88.565 kg)  BMI 31.04 kg/m2 Examination shows that she is healing nicely. She does have a little redness around the anus like Mycolog cream was being used for and I think she can resume the Mycolog in addition to the lidocaine. There is no evidence of any infections in the multiple incisions and everything appears to be healing satisfactory    Assessment:     Satisfactory progress following the hemorrhoidectomy but she did have a cardiac arrhythmia and Dr. Tamala Julian as adjusted her cardiac medication    Plan:     Followup in 2 weeks

## 2011-01-01 NOTE — Patient Instructions (Signed)
Try to keep her stools soft but semi-formed as diarrhea causes leakage and irritation around the anus. See me in 2-3 weeks earlier if you need

## 2011-01-14 ENCOUNTER — Encounter (INDEPENDENT_AMBULATORY_CARE_PROVIDER_SITE_OTHER): Payer: Self-pay | Admitting: General Surgery

## 2011-01-14 ENCOUNTER — Ambulatory Visit (INDEPENDENT_AMBULATORY_CARE_PROVIDER_SITE_OTHER): Payer: Medicare Other | Admitting: General Surgery

## 2011-01-14 DIAGNOSIS — K649 Unspecified hemorrhoids: Secondary | ICD-10-CM

## 2011-01-14 NOTE — Patient Instructions (Signed)
Try to keep your stools soft and not firm and very the MiraLax to achieve this regularity. You may return to work we'll try to avoid lifting over approximately 30x4 approximately 3 week and return to see me on a p.r.n. basis

## 2011-01-14 NOTE — Progress Notes (Signed)
Subjective:     Patient ID: Rebecca Sandoval, female   DOB: Feb 22, 1936, 75 y.o.   MRN: [id number]  HPIMrs Sandoval is now approximately 3 weeks following internal and external hemorrhoidectomy and states she is having bowel movements without pain no prolapse and she's not noticed any bleeding. She also has had her echocardiogram reports that the technician so no abnormalities but she is not got the facial region from her cardiologist. She had an episode of ventricular fibrillation about a week after her surgery as an outpatient and whether the pain contributed to this or not we are not sure she was only taking occasional Vicodin pain tablet. Her stools are soft she is not noticed any bleeding and she's having no tenderness with wife and all her usual normal activities  Review of Systems Current Outpatient Prescriptions  Medication Sig Dispense Refill  . diltiazem (CARDIZEM CD) 240 MG 24 hr capsule Take 240 mg by mouth daily.        . magnesium gluconate (MAGONATE) 30 MG tablet Take 30 mg by mouth 2 (two) times daily.        . metoprolol (TOPROL-XL) 100 MG 24 hr tablet Take 50 mg by mouth daily. Pt to take 1/2 in the morning and 1/5 in the afternoon      . PARoxetine (PAXIL) 20 MG tablet Take 20 mg by mouth every morning.        . potassium chloride (MICRO-K) 10 MEQ CR capsule Take 10 mEq by mouth 2 (two) times daily.        Marland Kitchen triamterene-hydrochlorothiazide (MAXZIDE-25) 37.5-25 MG per tablet Take 1 tablet by mouth daily.             Objective:   Physical ExamBP 124/74  Pulse 52  Temp(Src) 98 F (36.7 C) (Temporal)  Resp 16  Ht 5' 6.5" (1.689 m)  Wt 192 lb 6.4 oz (87.272 kg)  BMI 30.59 kg/m2  Examination of the anus and perianal area reveals no evidence of any persistent granulation tissue and the external portion of her hemorrhoidectomy incision and on digital exam showed an index finger easily there may still be a little but of sutures tied up in the anal vault but they're not symptomatic  and the patient is returned to work and I would only recommend that she limits her lift into approximately 30 lbs for the next 3 weeks. She is using MiraLax to regulate her stools with soft and not firm and states she's not had any problems with leakage over the last few days    Assessment:         Plan:     See me prn

## 2011-02-05 ENCOUNTER — Telehealth (INDEPENDENT_AMBULATORY_CARE_PROVIDER_SITE_OTHER): Payer: Self-pay

## 2011-02-05 NOTE — Telephone Encounter (Signed)
Mycolog II cream #30- 2 refills faxed to [facility].

## 2011-08-11 DIAGNOSIS — M109 Gout, unspecified: Secondary | ICD-10-CM | POA: Insufficient documentation

## 2011-11-15 ENCOUNTER — Emergency Department (HOSPITAL_COMMUNITY)
Admission: EM | Admit: 2011-11-15 | Discharge: 2011-11-16 | Disposition: A | Discharge status: Home / Self Care | Payer: Medicare Other | Attending: Emergency Medicine | Admitting: Emergency Medicine

## 2011-11-15 ENCOUNTER — Emergency Department (HOSPITAL_COMMUNITY): Payer: Medicare Other

## 2011-11-15 ENCOUNTER — Encounter (HOSPITAL_COMMUNITY): Payer: Self-pay | Admitting: Emergency Medicine

## 2011-11-15 DIAGNOSIS — N390 Urinary tract infection, site not specified: Secondary | ICD-10-CM | POA: Insufficient documentation

## 2011-11-15 DIAGNOSIS — Z7901 Long term (current) use of anticoagulants: Secondary | ICD-10-CM | POA: Insufficient documentation

## 2011-11-15 DIAGNOSIS — I499 Cardiac arrhythmia, unspecified: Secondary | ICD-10-CM | POA: Insufficient documentation

## 2011-11-15 DIAGNOSIS — R079 Chest pain, unspecified: Secondary | ICD-10-CM | POA: Insufficient documentation

## 2011-11-15 DIAGNOSIS — R197 Diarrhea, unspecified: Secondary | ICD-10-CM | POA: Insufficient documentation

## 2011-11-15 DIAGNOSIS — R110 Nausea: Secondary | ICD-10-CM | POA: Insufficient documentation

## 2011-11-15 LAB — CBC
MCHC: 34.5 g/dL (ref 30.0–36.0)
Platelets: 221 10*3/uL (ref 150–400)
RDW: 13.5 % (ref 11.5–15.5)

## 2011-11-15 NOTE — ED Notes (Signed)
Pt alert, arrives from home, c/o "feeling flush", onset was this evening, denies chest pain or SOB, resp even unlabored, skin pwd

## 2011-11-15 NOTE — ED Notes (Signed)
Pt states that she has hx of afib but began having a flushing feeling that ran from her toes all the way to her head. It is causing a feeling of fullness in her head. Pt is A&O x4 and is asymptomatic. Continuous monitoring initiated.

## 2011-11-16 LAB — BASIC METABOLIC PANEL
GFR calc Af Amer: 48 mL/min — ABNORMAL LOW (ref >90)
GFR calc non Af Amer: 42 mL/min — ABNORMAL LOW (ref >90)
Potassium: 4 mEq/L (ref 3.5–5.1)
Sodium: 137 mEq/L (ref 135–145)

## 2011-11-16 LAB — URINALYSIS, ROUTINE W REFLEX MICROSCOPIC
Glucose, UA: NEGATIVE mg/dL
Protein, ur: NEGATIVE mg/dL
Specific Gravity, Urine: 1.016 (ref 1.005–1.030)
Urobilinogen, UA: 0.2 mg/dL (ref 0.0–1.0)

## 2011-11-16 LAB — HEPATIC FUNCTION PANEL
AST: 25 U/L (ref 0–37)
Albumin: 4.1 g/dL (ref 3.5–5.2)
Alkaline Phosphatase: 52 U/L (ref 39–117)
Total Protein: 7.6 g/dL (ref 6.0–8.3)

## 2011-11-16 LAB — URINE MICROSCOPIC-ADD ON

## 2011-11-16 LAB — PROTIME-INR: Prothrombin Time: 21.4 seconds — ABNORMAL HIGH (ref 11.6–15.2)

## 2011-11-16 LAB — LIPASE, BLOOD: Lipase: 29 U/L (ref 11–59)

## 2011-11-16 MED ORDER — ONDANSETRON HCL 4 MG/2ML IJ SOLN
4.0000 mg | Freq: Once | INTRAMUSCULAR | Status: AC
  Administered 2011-11-16: 4 mg via INTRAVENOUS
  Filled 2011-11-16: qty 2

## 2011-11-16 MED ORDER — DEXTROSE 5 % IV SOLN
1.0000 g | Freq: Once | INTRAVENOUS | Status: AC
  Administered 2011-11-16: 1 g via INTRAVENOUS
  Filled 2011-11-16: qty 10

## 2011-11-16 MED ORDER — SODIUM CHLORIDE 0.9 % IV BOLUS (SEPSIS)
1000.0000 mL | Freq: Once | INTRAVENOUS | Status: AC
  Administered 2011-11-16: 1000 mL via INTRAVENOUS

## 2011-11-16 MED ORDER — SULFAMETHOXAZOLE-TMP DS 800-160 MG PO TABS: 1.0000 | ORAL_TABLET | Freq: Once | ORAL | Status: DC

## 2011-11-16 MED ORDER — CEPHALEXIN 500 MG PO CAPS: 500.0000 mg | ORAL_CAPSULE | Freq: Two times a day (BID) | ORAL | Status: AC

## 2011-11-16 NOTE — ED Notes (Signed)
Pt has had 2 of the "flush" episodes where she gets the hot feeling that runs from her feet to her head.  During the episode, which lasts for 1-5 seconds, she visibly wince, her HR decreases to 30s. Once the episode passes, the pt returns to her baseline.

## 2011-11-16 NOTE — ED Provider Notes (Signed)
History     CSN: [id number]  Arrival date & time 11/15/11  2223   First MD Initiated Contact with Patient 11/15/11 2352      Chief Complaint  Patient presents with  . Chest Pain    HPI The patient presents with concerns of flushing and nausea.  Notably, the patient has a long history of atrial fibrillation, short gut syndrome.  She has stitches in her usual state of health until approximately 3 hours ago, when she subacute he developed flushing, diffusely, but greater in the torso and upper extremities.  She also notes nausea, but no vomiting or new bowel changes.  Baseline she has chronic diarrhea.  She denies any chest pain, dyspnea, cough, fevers, chills.  No clear alleviating or exacerbating factors. Past Medical History  Diagnosis Date  . Obesity   . Anxiety   . GERD (gastroesophageal reflux disease)   . Palpitations   . Rectal bleeding   . Atrial fibrillation october 13,2012    pt seen at [facility]   . Hemorrhoid     Past Surgical History  Procedure Date  . Abdominal hysterectomy 1975  . Bartholyn cyst 1973  . Bilateral temporomandibular joint arthroplasty 1977  . Diverticulitis 1986  . Shoulder surgery     left 97, rigth 98  . Hernia repair 1996    hiatal  . US echocardiography     Family History  Problem Relation Age of Onset  . Stroke Mother 31  . Cancer Paternal Aunt   . Cancer Paternal Uncle     History  Substance Use Topics  . Smoking status: Former Research scientist (life sciences)  . Smokeless tobacco: Never Used  . Alcohol Use: No    OB History    Grav Para Term Preterm Abortions TAB SAB Ect Mult Living                  Review of Systems  Constitutional:       HPI  HENT:       HPI otherwise negative  Eyes: Negative.   Respiratory:       HPI, otherwise negative  Cardiovascular:       HPI, otherwise nmegative  Gastrointestinal: Positive for nausea and diarrhea. Negative for vomiting.  Genitourinary:       HPI, otherwise negative  Musculoskeletal:       HPI,  otherwise negative  Skin: Negative.   Neurological: Negative for syncope.    Allergies  Review of patient's allergies indicates no known allergies.  Home Medications   Current Outpatient Rx  Name Route Sig Dispense Refill  . DILTIAZEM HCL ER COATED BEADS 240 MG PO CP24 Oral Take 240 mg by mouth daily.      Marland Kitchen METOPROLOL TARTRATE 50 MG PO TABS Oral Take 25 mg by mouth.    Marland Kitchen PAROXETINE HCL 20 MG PO TABS Oral Take 20 mg by mouth every morning.      . TRIAMTERENE-HCTZ 37.5-25 MG PO TABS Oral Take 1 tablet by mouth daily.      . WARFARIN SODIUM 5 MG PO TABS Oral Take 7.5 mg by mouth daily.      BP 174/95  Pulse 78  Temp 97 F (36.1 C) (Oral)  Resp 16  SpO2 98%  Physical Exam  Nursing note and vitals reviewed. Constitutional: She is oriented to person, place, and time. She appears well-developed and well-nourished. No distress.  HENT:  Head: Normocephalic and atraumatic.  Eyes: Conjunctivae and EOM are normal.  Cardiovascular: Normal rate.  An irregularly irregular rhythm present.  Pulmonary/Chest: Effort normal and breath sounds normal. No stridor. No respiratory distress.  Abdominal: She exhibits no distension. There is no tenderness. There is no rigidity, no guarding and no CVA tenderness.  Musculoskeletal: She exhibits no edema.  Neurological: She is alert and oriented to person, place, and time. No cranial nerve deficit.  Skin: Skin is warm and dry.  Psychiatric: She has a normal mood and affect.    ED Course  Procedures (including critical care time)  Labs Reviewed  BASIC METABOLIC PANEL - Abnormal; Notable for the following:    Glucose, Bld 165 (*)     Creatinine, Ser 1.24 (*)     GFR calc non Af Amer 42 (*)     GFR calc Af Amer 48 (*)     All other components within normal limits  CBC  TROPONIN I  HEPATIC FUNCTION PANEL  LIPASE, BLOOD  PROTIME-INR  URINALYSIS, ROUTINE W REFLEX MICROSCOPIC   Dg Chest 2 View  11/15/2011  *RADIOLOGY REPORT*  Clinical Data:  chest pain  CHEST - 2 VIEW  Comparison: 12/28/2010  Findings: Normal heart size.  No pleural effusion or edema.  No airspace consolidation is identified.  There are surgical clips noted within the left upper quadrant of the abdomen.  IMPRESSION:  1.  No acute cardiopulmonary abnormalities.   Original Report Authenticated By: Angelita Ingles, M.D.      No diagnosis found.  Cardiac: 77 afib, abnormal  O2: 99%RA, normal   Date: 11/16/2011  Rate: 71  Rhythm: atrial fibrillation  QRS Axis: normal  Intervals: afib  ST/T Wave abnormalities: nonspecific T wave changes  Conduction Disutrbances:none  Narrative Interpretation:   Old EKG Reviewed: unchanged ABNORM  2:18 AM I was called to the room after the patient's alarm noted hypoxia.  The patient re-affirmed her lack of respiratory complaints.  She was at 100% (on [place]) at that time. MDM  This 76 rhythm presents with concerns of flushing and nausea.  Notably, the patient specifically denies any chest pain or dyspnea.  She does have a history of atrial fibrillation for which she is anticoagulated.  The patient's labs notable for demonstration of mild subtherapeutic INR and leukocytes in her urine.  Urine culture sent, and the patient was treated for a urinary tract infection.  We discussed all results and return precautions, and the patient was present in the home.  This was accommodated.  Given the patient's recent initiation of uric acid medication, there is some suspicion of medication reaction, though urinary tract infection is also a possible etiology.  Absent chest pain, dyspnea, other overt symptoms, there is low suspicion for acute cardiovascular compromise.        Carmin Muskrat, MD 11/16/11 0221

## 2012-09-16 ENCOUNTER — Ambulatory Visit (INDEPENDENT_AMBULATORY_CARE_PROVIDER_SITE_OTHER): Payer: Medicare Other | Admitting: Internal Medicine

## 2012-09-16 ENCOUNTER — Encounter: Payer: Self-pay | Admitting: Internal Medicine

## 2012-09-16 VITALS — BP 132/62 | HR 48 | Ht 66.0 in | Wt 203.0 lb

## 2012-09-16 DIAGNOSIS — I495 Sick sinus syndrome: Secondary | ICD-10-CM

## 2012-09-16 DIAGNOSIS — I480 Paroxysmal atrial fibrillation: Secondary | ICD-10-CM

## 2012-09-16 DIAGNOSIS — I4891 Unspecified atrial fibrillation: Secondary | ICD-10-CM

## 2012-09-16 MED ORDER — DILTIAZEM HCL 30 MG PO TABS: 30.0000 mg | ORAL_TABLET | Freq: Four times a day (QID) | ORAL | Status: DC

## 2012-09-16 MED ORDER — FLECAINIDE ACETATE 50 MG PO TABS: 50.0000 mg | ORAL_TABLET | Freq: Two times a day (BID) | ORAL | Status: DC

## 2012-09-16 NOTE — Patient Instructions (Addendum)
Your physician recommends that you schedule a follow-up appointment in: 2 months with Dr Rayann Heman  Your physician has recommended you make the following change in your medication:  1) Stop Diltiazem 2) Start Flecainide $RemoveBeforeDE'50mg'MTadfrhHcdiDKfu$  twice daily 3) start Cardizem $RemoveBefore'30mg'sYatyapuppNpR$  1-2 every 6 hours as needed   Your physician has requested that you have an exercise tolerance test. For further information please visit [email or web address]. Please also follow instruction sheet, as given.---Dr Smith's office to call you to set up within the next 2 weeks

## 2012-09-16 NOTE — Progress Notes (Signed)
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 female with a h/o paroxysmal atrial fibrillation who presents today for EP consultation.  She reports being initially diagnosed with atrial fibrillation in 1996.  She has had increasing frequency and duration of atrial fibrillation since that time.  She has been treated with metoprolol.  She has been observed to have sinus bradycardia with this regimen but is not significantly symptomatic.  She takes coumadin for stroke prevention.   She had afib with RVR 10/13 for which she required ER evaluation.  Diltiazem was added at that time.  She did well without recurrence of afib until 07/23/12 when she had recurrence of afib lasting several hours.  She has anxiety and reports that she has some difficulty with worsening anxiety when her heart rates are elevated. She is unaware of any triggers or precipitants of her afib.  She had similar symptoms 09/03/12 during which she felt afib with heart rates up to 108 bpm.  By the time she arrived at Dr Darliss Ridgel office, her heart rate was 42 bpm.  She had transient presyncope with her conversion.  Today, she denies symptoms of chest pain, shortness of breath, orthopnea, PND, lower extremity edema, syncope, or neurologic sequela. The patient is tolerating medications without difficulties and is otherwise without complaint today.   Past Medical History  Diagnosis Date  . Obesity   . Anxiety   . GERD (gastroesophageal reflux disease)   . Paroxysmal atrial fibrillation   . Hemorrhoid   . Tachycardia-bradycardia    Past Surgical History  Procedure Laterality Date  . Abdominal hysterectomy  1975  . Bartholyn cyst  1973  . Bilateral temporomandibular joint arthroplasty  1977  . Diverticulitis  1986  . Shoulder surgery      left 97, rigth 98  . Hernia repair  1996    hiatal    Current Outpatient Prescriptions  Medication Sig Dispense Refill  .  diltiazem (CARDIZEM CD) 240 MG 24 hr capsule Take 240 mg by mouth daily.        . metoprolol (LOPRESSOR) 50 MG tablet Take 25 mg by mouth.      Marland Kitchen PARoxetine (PAXIL) 20 MG tablet Take 20 mg by mouth every morning.        . triamterene-hydrochlorothiazide (MAXZIDE-25) 37.5-25 MG per tablet Take 1 tablet by mouth daily.        Marland Kitchen warfarin (COUMADIN) 5 MG tablet Take 7.5 mg by mouth daily.       No current facility-administered medications for this visit.    No Known Allergies  History   Social History  . Marital Status: Married    Spouse Name: N/A    Number of Children: N/A  . Years of Education: N/A   Occupational History  . Not on file.   Social History Main Topics  . Smoking status: Former Research scientist (life sciences)  . Smokeless tobacco: Never Used  . Alcohol Use: No  . Drug Use: No  . Sexually Active:    Other Topics Concern  . Not on file   Social History Narrative   Pt lives in [place] with spouse   Works as a Community education officer at [employer].    Family History  Problem Relation Age of Onset  . Stroke Mother 20  . Cancer Paternal Aunt   . Cancer Paternal Uncle     ROS- All systems are reviewed and negative except as per the  HPI above  Physical Exam: Filed Vitals:   09/16/12 1155  BP: 132/62  Pulse: 48  Height: $Remove'5\' 6"'zwkzqQY$  (1.676 m)  Weight: 203 lb (92.08 kg)    GEN- The patient is well appearing, alert and oriented x 3 today.   Head- normocephalic, atraumatic Eyes-  Sclera clear, conjunctiva pink Ears- hearing intact Oropharynx- clear Neck- supple, no JVP Lymph- no cervical lymphadenopathy Lungs- Clear to ausculation bilaterally, normal work of breathing Heart- Regular rate and rhythm, no murmurs, rubs or gallops, PMI not laterally displaced GI- soft, NT, ND, + BS Extremities- no clubbing, cyanosis, or edema MS- no significant deformity or atrophy Skin- no rash or lesion Psych- euthymic mood, full affect Neuro- strength and sensation are intact  EKG today reveals sinus  bradycardia 48 bpm, otherwise normal ekg  Assessment and Plan:  1. Paroxysmal atrial fibrillation The patient has symptomatic paroxysmal atrial fibrillation.  Episodes appear to be increasing in frequency. Therapeutic strategies for afib including medicine and ablation were discussed in detail with the patient today.  At this time, we will try AAD therapy. Start flecainide $RemoveBeforeDE'50mg'nhrYMoaYgCCkkzn$  BID Stop diltiazem due to bradycardia.  Given the infrequent nature of her afib, she can take short activing diltiazem prn when afib occurs. She should have a GXT by Dr Tamala Julian in 2-3 weeks to evaluate for exercise induced arrhythmias.  2. Tachycardia/ bradycardia She would like to avoid PPM She is mostly asymptomatic with bradycardia. As above, we will stop diltiazem and use short acting diltiazem as needed  Return for follow-up with me in 2 months

## 2012-09-17 ENCOUNTER — Telehealth: Payer: Self-pay | Admitting: Physician Assistant

## 2012-09-17 NOTE — Telephone Encounter (Signed)
Pt called for clarification on rx. Yesterday, saw Dr. Rayann Heman. Was instructed to stop long-acting diltiazem, start Flecainide $RemoveBeforeDE'50mg'PpcerYYiHAVcRvL$  BID and start cardizem $RemoveBefore'30mg'mZGCSrizdgwPz$  1-2tab q6hr PRN. She was concerned because when she picked up her script. The cardizem $RemoveBefo'30mg'ifsmvetXsgf$  rx said "diltiazem" on it and she was concerned that the nurse had made a mistake. I assured her that diltiazem is another name for cardizem, and the new rx is for short-acting. I reiterated the instructions to take cardizem $RemoveBefor'30mg'PQNGRwGqFYbp$  1-2 tablets every 6 hrs as needed. She verbalized understanding and gratitude. Dayna Dunn PA-C

## 2012-09-23 DIAGNOSIS — I480 Paroxysmal atrial fibrillation: Secondary | ICD-10-CM | POA: Insufficient documentation

## 2012-09-23 DIAGNOSIS — I495 Sick sinus syndrome: Secondary | ICD-10-CM | POA: Insufficient documentation

## 2012-11-22 ENCOUNTER — Ambulatory Visit (INDEPENDENT_AMBULATORY_CARE_PROVIDER_SITE_OTHER): Payer: Medicare Other | Admitting: Internal Medicine

## 2012-11-22 VITALS — BP 128/64 | HR 53 | Ht 66.0 in | Wt 203.0 lb

## 2012-11-22 DIAGNOSIS — I495 Sick sinus syndrome: Secondary | ICD-10-CM

## 2012-11-22 DIAGNOSIS — I4891 Unspecified atrial fibrillation: Secondary | ICD-10-CM

## 2012-11-22 DIAGNOSIS — I480 Paroxysmal atrial fibrillation: Secondary | ICD-10-CM

## 2012-11-22 NOTE — Patient Instructions (Signed)
Your physician recommends that you schedule a follow-up appointment as needed  

## 2012-11-22 NOTE — Progress Notes (Signed)
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 female who presents today for routine electrophysiology followup.  Since last being seen in our clinic, the patient reports doing very well. She has had no afib.  Today, she denies symptoms of palpitations, chest pain, shortness of breath,  lower extremity edema, dizziness, presyncope, or syncope.  The patient is otherwise without complaint today.   Past Medical History  Diagnosis Date  . Obesity   . Anxiety   . GERD (gastroesophageal reflux disease)   . Paroxysmal atrial fibrillation   . Hemorrhoid   . Tachycardia-bradycardia    Past Surgical History  Procedure Laterality Date  . Abdominal hysterectomy  1975  . Bartholyn cyst  1973  . Bilateral temporomandibular joint arthroplasty  1977  . Diverticulitis  1986  . Shoulder surgery      left 97, rigth 98  . Hernia repair  1996    hiatal    Current Outpatient Prescriptions  Medication Sig Dispense Refill  . diltiazem (CARDIZEM) 30 MG tablet Take 30 mg by mouth as needed.      . flecainide (TAMBOCOR) 50 MG tablet Take 1 tablet (50 mg total) by mouth 2 (two) times daily.  180 tablet  3  . metoprolol (LOPRESSOR) 50 MG tablet Take 25 mg by mouth.      Marland Kitchen PARoxetine (PAXIL) 20 MG tablet Take 20 mg by mouth every morning.        . triamterene-hydrochlorothiazide (MAXZIDE-25) 37.5-25 MG per tablet Take 1 tablet by mouth daily.        Marland Kitchen warfarin (COUMADIN) 5 MG tablet Take 7.5 mg by mouth daily.       No current facility-administered medications for this visit.    Physical Exam: Filed Vitals:   11/22/12 1016  BP: 128/64  Pulse: 53  Height: $Remove'5\' 6"'bTxIGLB$  (1.676 m)  Weight: 203 lb (92.08 kg)    GEN- The patient is well appearing, alert and oriented x 3 today.   Head- normocephalic, atraumatic Eyes-  Sclera clear, conjunctiva pink Ears- hearing intact Oropharynx- clear Lungs- Clear to ausculation bilaterally, normal work of breathing Heart-  Regular rate and rhythm, no murmurs, rubs or gallops, PMI not laterally displaced GI- soft, NT, ND, + BS Extremities- no clubbing, cyanosis, or edema  ekg today reveals sinus bradycardia 53  Bpm, otherwise normal ekg Recent GXT by Dr Tamala Julian is reviewed (09/28/12) and reveals no exercise induced arrhyhtmias.  She reached 109 bpm which was 75 % of predicted maximum.  She reports that she was limited by her legs.  Assessment and Plan:  1. afib Well controlled with flecainide She will take diltiazem as needed when afib occurs  2. Bradycardia Minimally symptomatic Continue current therapy  I will see her as needed going forward She will continue to follow with Dr Tamala Julian

## 2013-01-04 ENCOUNTER — Ambulatory Visit (INDEPENDENT_AMBULATORY_CARE_PROVIDER_SITE_OTHER): Payer: Medicare Other | Admitting: Pharmacist

## 2013-01-04 DIAGNOSIS — I4891 Unspecified atrial fibrillation: Secondary | ICD-10-CM

## 2013-01-21 ENCOUNTER — Telehealth: Payer: Self-pay | Admitting: Interventional Cardiology

## 2013-01-21 NOTE — Telephone Encounter (Signed)
New Problem  Pt states she went to the Dr. and has received to additional medications, She asks what should she do with her Warfarin// please advise    Tussion EX (Cough medicine) Amoxicillin

## 2013-01-21 NOTE — Telephone Encounter (Signed)
Spoke with pt.  Amoxicillin and tussinex are both okay with Coumadin.  No change in dose needed.

## 2013-01-27 ENCOUNTER — Telehealth: Payer: Self-pay | Admitting: *Deleted

## 2013-01-27 NOTE — Telephone Encounter (Signed)
Patient requests refill of metoprolol tart. $RemoveBeforeDE'50mg'OnhpTMOwiADFvFo$  bid to be sent to [facility] on [address].

## 2013-01-28 ENCOUNTER — Other Ambulatory Visit: Payer: Self-pay

## 2013-01-28 MED ORDER — METOPROLOL TARTRATE 50 MG PO TABS: ORAL_TABLET | ORAL | Status: DC

## 2013-01-31 ENCOUNTER — Telehealth: Payer: Self-pay | Admitting: *Deleted

## 2013-01-31 MED ORDER — METOPROLOL TARTRATE 50 MG PO TABS: 25.0000 mg | ORAL_TABLET | Freq: Two times a day (BID) | ORAL | Status: DC

## 2013-01-31 NOTE — Telephone Encounter (Signed)
called pt. verified pt should be taking metoprolol $RemoveBeforeDEI'25mg'DRQcLZqzCNMOufXB$  bid.a new rx sent to pt pharmacy. Pt verbalized understanding

## 2013-02-15 ENCOUNTER — Telehealth: Payer: Self-pay | Admitting: Interventional Cardiology

## 2013-02-15 ENCOUNTER — Ambulatory Visit (INDEPENDENT_AMBULATORY_CARE_PROVIDER_SITE_OTHER): Payer: Medicare Other | Admitting: Pharmacist

## 2013-02-15 DIAGNOSIS — I4891 Unspecified atrial fibrillation: Secondary | ICD-10-CM

## 2013-02-15 NOTE — Telephone Encounter (Signed)
New problem   Pt has question concerning new medication per Bettie from service please call pt.

## 2013-03-09 ENCOUNTER — Telehealth: Payer: Self-pay | Admitting: Interventional Cardiology

## 2013-03-09 NOTE — Telephone Encounter (Signed)
returned pt call.spoke with Tera Helper, NP. because pt is taking Cefdinir $RemoveBeforeD'300mg'ItTKoUQdnXUXxY$  bid it can intefre with her coumadin pt to have inr checked next week.coumadin appt made 03/15/13 $RemoveBefor'@4'YickMzdJjkZJ$ :30.pt verbalized understanding.

## 2013-03-09 NOTE — Telephone Encounter (Signed)
New problem   Pt started and Antibiotic  CEFDINIR 300 mg twice a day 12/23 but has quest about her Warfarin.  Please give her a call back.

## 2013-03-14 ENCOUNTER — Emergency Department (HOSPITAL_COMMUNITY): Payer: Medicare Other

## 2013-03-14 ENCOUNTER — Emergency Department (HOSPITAL_COMMUNITY)
Admission: EM | Admit: 2013-03-14 | Discharge: 2013-03-14 | Disposition: A | Discharge status: Home / Self Care | Payer: Medicare Other | Attending: Emergency Medicine | Admitting: Emergency Medicine

## 2013-03-14 ENCOUNTER — Encounter (HOSPITAL_COMMUNITY): Payer: Self-pay | Admitting: Emergency Medicine

## 2013-03-14 DIAGNOSIS — E669 Obesity, unspecified: Secondary | ICD-10-CM | POA: Insufficient documentation

## 2013-03-14 DIAGNOSIS — I4891 Unspecified atrial fibrillation: Secondary | ICD-10-CM | POA: Insufficient documentation

## 2013-03-14 DIAGNOSIS — Z87891 Personal history of nicotine dependence: Secondary | ICD-10-CM | POA: Insufficient documentation

## 2013-03-14 DIAGNOSIS — Z9889 Other specified postprocedural states: Secondary | ICD-10-CM | POA: Insufficient documentation

## 2013-03-14 DIAGNOSIS — K5792 Diverticulitis of intestine, part unspecified, without perforation or abscess without bleeding: Secondary | ICD-10-CM

## 2013-03-14 DIAGNOSIS — Z79899 Other long term (current) drug therapy: Secondary | ICD-10-CM | POA: Insufficient documentation

## 2013-03-14 DIAGNOSIS — Z90710 Acquired absence of both cervix and uterus: Secondary | ICD-10-CM | POA: Insufficient documentation

## 2013-03-14 DIAGNOSIS — K5732 Diverticulitis of large intestine without perforation or abscess without bleeding: Secondary | ICD-10-CM | POA: Insufficient documentation

## 2013-03-14 DIAGNOSIS — F411 Generalized anxiety disorder: Secondary | ICD-10-CM | POA: Insufficient documentation

## 2013-03-14 DIAGNOSIS — Z7901 Long term (current) use of anticoagulants: Secondary | ICD-10-CM | POA: Insufficient documentation

## 2013-03-14 HISTORY — DX: Diverticulitis of intestine, part unspecified, without perforation or abscess without bleeding: K57.92

## 2013-03-14 LAB — COMPREHENSIVE METABOLIC PANEL
Albumin: 3.9 g/dL (ref 3.5–5.2)
BUN: 21 mg/dL (ref 6–23)
Creatinine, Ser: 1.32 mg/dL — ABNORMAL HIGH (ref 0.50–1.10)
Total Bilirubin: 0.3 mg/dL (ref 0.3–1.2)
Total Protein: 7.7 g/dL (ref 6.0–8.3)

## 2013-03-14 LAB — CBC WITH DIFFERENTIAL/PLATELET
Basophils Relative: 0 % (ref 0–1)
Eosinophils Absolute: 0 10*3/uL (ref 0.0–0.7)
Eosinophils Relative: 0 % (ref 0–5)
HCT: 42.8 % (ref 36.0–46.0)
Hemoglobin: 14.8 g/dL (ref 12.0–15.0)
MCH: 32.2 pg (ref 26.0–34.0)
MCHC: 34.6 g/dL (ref 30.0–36.0)
Monocytes Absolute: 1 10*3/uL (ref 0.1–1.0)
Monocytes Relative: 7 % (ref 3–12)

## 2013-03-14 LAB — LIPASE, BLOOD: Lipase: 32 U/L (ref 11–59)

## 2013-03-14 MED ORDER — OXYCODONE-ACETAMINOPHEN 7.5-325 MG PO TABS: 1.0000 | ORAL_TABLET | ORAL | Status: DC | PRN

## 2013-03-14 MED ORDER — AMOXICILLIN-POT CLAVULANATE 500-125 MG PO TABS: 1.0000 | ORAL_TABLET | Freq: Three times a day (TID) | ORAL | Status: DC

## 2013-03-14 MED ORDER — FENTANYL CITRATE 0.05 MG/ML IJ SOLN
50.0000 ug | Freq: Once | INTRAMUSCULAR | Status: AC
  Administered 2013-03-14: 50 ug via INTRAVENOUS
  Filled 2013-03-14: qty 2

## 2013-03-14 MED ORDER — ONDANSETRON HCL 4 MG/2ML IJ SOLN
4.0000 mg | Freq: Once | INTRAMUSCULAR | Status: AC
  Administered 2013-03-14: 4 mg via INTRAVENOUS
  Filled 2013-03-14: qty 2

## 2013-03-14 MED ORDER — IOHEXOL 300 MG/ML  SOLN
100.0000 mL | Freq: Once | INTRAMUSCULAR | Status: AC | PRN
  Administered 2013-03-14: 100 mL via INTRAVENOUS

## 2013-03-14 MED ORDER — SODIUM CHLORIDE 0.9 % IV SOLN
3.0000 g | Freq: Once | INTRAVENOUS | Status: AC
  Administered 2013-03-14: 3 g via INTRAVENOUS
  Filled 2013-03-14 (×2): qty 3

## 2013-03-14 MED ORDER — IOHEXOL 300 MG/ML  SOLN
50.0000 mL | Freq: Once | INTRAMUSCULAR | Status: AC | PRN
  Administered 2013-03-14: 50 mL via ORAL

## 2013-03-14 NOTE — ED Notes (Addendum)
Pt c/o LUQ pain and nausea starting this morning. Pain score 6/10.  Pt sts she was seen at PCP for same complaint, her WBC count was elevated to 18.8, and she was sent here.  Pt sts pain started in RUQ, but has moved to LUQ.  Hx of multiple abdominal surgeries.

## 2013-03-14 NOTE — ED Provider Notes (Signed)
CSN: [id number]     Arrival date & time 03/14/13  1500 History   First MD Initiated Contact with Patient 03/14/13 1713     Chief Complaint  Patient presents with  . Abdominal Pain    HPI Patient presents with abdominal pain which started in right lower quadrant and moved to the left lower quadrant earlier.  She went and saw her family doctor who did lab work and found her white blood cell count to be elevated.  Patient has had numerous surgeries in the past for diverticulitis.  Patient has had some nausea but no vomiting.  No known fever or chills.  Says the pain feels better. Past Medical History  Diagnosis Date  . Obesity   . Anxiety   . GERD (gastroesophageal reflux disease)   . Paroxysmal atrial fibrillation   . Hemorrhoid   . Tachycardia-bradycardia   . Diverticulitis    Past Surgical History  Procedure Laterality Date  . Abdominal hysterectomy  1975  . Bartholyn cyst  1973  . Bilateral temporomandibular joint arthroplasty  1977  . Diverticulitis  1986  . Shoulder surgery      left 97, rigth 98  . Hernia repair  1996    hiatal  . Appendectomy    . Tonsillectomy     Family History  Problem Relation Age of Onset  . Stroke Mother 40  . Cancer Paternal Aunt   . Cancer Paternal Uncle    History  Substance Use Topics  . Smoking status: Former Research scientist (life sciences)  . Smokeless tobacco: Never Used  . Alcohol Use: No   OB History   Grav Para Term Preterm Abortions TAB SAB Ect Mult Living                 Review of Systems All other systems reviewed and are negative Allergies  Propofol  Home Medications   Current Outpatient Rx  Name  Route  Sig  Dispense  Refill  . cefdinir (OMNICEF) 300 MG capsule   Oral   Take 300 mg by mouth 2 (two) times daily. For 10 days.         Marland Kitchen diltiazem (CARDIZEM) 30 MG tablet   Oral   Take 30 mg by mouth as needed (a-fib.).          Marland Kitchen flecainide (TAMBOCOR) 50 MG tablet   Oral   Take 1 tablet (50 mg total) by mouth 2 (two) times  daily.   180 tablet   3   . metoprolol (LOPRESSOR) 50 MG tablet   Oral   Take 0.5 tablets (25 mg total) by mouth 2 (two) times daily.   30 tablet   6   . PARoxetine (PAXIL) 20 MG tablet   Oral   Take 20 mg by mouth every morning.           . triamterene-hydrochlorothiazide (MAXZIDE-25) 37.5-25 MG per tablet   Oral   Take 1 tablet by mouth daily.           Marland Kitchen warfarin (COUMADIN) 5 MG tablet   Oral   Take 5-7.5 mg by mouth daily. Take 7.5 mg all days except Monday and Friday.  On Monday and Friday, take 5 mg.         . amoxicillin-clavulanate (AUGMENTIN) 500-125 MG per tablet   Oral   Take 1 tablet (500 mg total) by mouth 3 (three) times daily.   21 tablet   0   . oxyCODONE-acetaminophen (PERCOCET) 7.5-325 MG per tablet  Oral   Take 1 tablet by mouth every 4 (four) hours as needed for pain.   30 tablet   0    BP 137/63  Pulse 71  Temp(Src) 98.4 F (36.9 C) (Oral)  Resp 16  SpO2 96% Physical Exam  Nursing note and vitals reviewed. Constitutional: She is oriented to person, place, and time. She appears well-developed and well-nourished. No distress.  HENT:  Head: Normocephalic and atraumatic.  Eyes: Pupils are equal, round, and reactive to light.  Neck: Normal range of motion.  Cardiovascular: Normal rate and intact distal pulses.   Pulmonary/Chest: No respiratory distress.  Abdominal: Normal appearance. She exhibits no distension. There is no tenderness. There is no rebound.  Musculoskeletal: Normal range of motion.  Neurological: She is alert and oriented to person, place, and time. No cranial nerve deficit.  Skin: Skin is warm and dry. No rash noted.  Psychiatric: She has a normal mood and affect. Her behavior is normal.    ED Course  Procedures (including critical care time) Labs Review Labs Reviewed  CBC WITH DIFFERENTIAL - Abnormal; Notable for the following:    WBC 15.6 (*)    Neutrophils Relative % 79 (*)    Neutro Abs 12.3 (*)    All other  components within normal limits  COMPREHENSIVE METABOLIC PANEL - Abnormal; Notable for the following:    Glucose, Bld 113 (*)    Creatinine, Ser 1.32 (*)    ALT 36 (*)    GFR calc non Af Amer 38 (*)    GFR calc Af Amer 45 (*)    All other components within normal limits  LIPASE, BLOOD    Medications  fentaNYL (SUBLIMAZE) injection 50 mcg (50 mcg Intravenous Given 03/14/13 1757)  ondansetron (ZOFRAN) injection 4 mg (4 mg Intravenous Given 03/14/13 1757)  iohexol (OMNIPAQUE) 300 MG/ML solution 50 mL (50 mLs Oral Contrast Given 03/14/13 1740)  iohexol (OMNIPAQUE) 300 MG/ML solution 100 mL (100 mLs Intravenous Contrast Given 03/14/13 1839)  Ampicillin-Sulbactam (UNASYN) 3 g in sodium chloride 0.9 % 100 mL IVPB (0 g Intravenous Stopped 03/14/13 2141)    Imaging Review Ct Abdomen Pelvis W Contrast  03/14/2013   CLINICAL DATA:  Left quadrant pain, history of prior appendectomy, white blood cell count-15.6 K  EXAM: CT ABDOMEN AND PELVIS WITH CONTRAST  TECHNIQUE: Multidetector CT imaging of the abdomen and pelvis was performed using the standard protocol following bolus administration of intravenous contrast.  CONTRAST:  191mL OMNIPAQUE IOHEXOL 300 MG/ML  SOLN  COMPARISON:  Ultrasound right upper quadrant 02/14/2008, CT abdomen 02/13/2008  FINDINGS: The lung bases are clear.  There is an 8 mm hypodensity in the right hepatic lobe which is too small to characterize, but is unchanged compared with 02/13/2008. There is no intrahepatic or extrahepatic biliary ductal dilatation. The gallbladder is normal. The spleen demonstrates no focal abnormality. There is mild prominence of the right renal pelvis likely reflecting an extrarenal pelvis and less likely a mild right UPJ obstruction. The left kidney, adrenal glands and pancreas are normal. The bladder is unremarkable.  There are numerous surgical clips in the abdomen. There is evidence of prior colonic anastomosis. The stomach, duodenum, small intestine, and  large intestine demonstrate no contrast extravasation or dilatation. There is no pneumoperitoneum, pneumatosis, or portal venous gas. There is no abdominal or pelvic free fluid. There is no lymphadenopathy.  The abdominal aorta is normal in caliber with atherosclerosis.  There are no lytic or sclerotic osseous lesions. There is thoracolumbar spine  spondylosis. There is severe degenerative disc disease at L5-S1 and bilateral facet arthropathy. There is a mild broad-based disc bulge at L2-3, L3-4 and L5-S1.  IMPRESSION: 1.  No acute abdominal or pelvic pathology.  2.  Thoracolumbar spine spondylosis.  3. Evidence of prior abdominal surgery with multiple surgical clips throughout the abdomen and a colonic anastomosis without obstruction.  4. There is mild prominence of the right renal pelvis likely reflecting an extrarenal pelvis and less likely a mild right UPJ obstruction.   Electronically Signed   By: Kathreen Devoid   On: 03/14/2013 19:09    EKG Interpretation   None       MDM   1. Diverticulitis    Patient has no acute abdominal pathology.  White count elevation may indicate early diverticulitis.  Will treat accordingly.  Patient had no vomiting he appears stable for discharge.    Dot Lanes, MD 03/14/13 [phone]

## 2013-03-15 ENCOUNTER — Ambulatory Visit (INDEPENDENT_AMBULATORY_CARE_PROVIDER_SITE_OTHER): Payer: Medicare Other | Admitting: *Deleted

## 2013-03-15 DIAGNOSIS — I4891 Unspecified atrial fibrillation: Secondary | ICD-10-CM

## 2013-04-05 ENCOUNTER — Ambulatory Visit (INDEPENDENT_AMBULATORY_CARE_PROVIDER_SITE_OTHER): Payer: Medicare Other | Admitting: *Deleted

## 2013-04-05 DIAGNOSIS — I4891 Unspecified atrial fibrillation: Secondary | ICD-10-CM

## 2013-04-05 LAB — POCT INR: INR: 2.4

## 2013-05-05 ENCOUNTER — Ambulatory Visit (INDEPENDENT_AMBULATORY_CARE_PROVIDER_SITE_OTHER): Payer: Medicare Other | Admitting: *Deleted

## 2013-05-05 DIAGNOSIS — I4891 Unspecified atrial fibrillation: Secondary | ICD-10-CM

## 2013-05-05 DIAGNOSIS — Z5181 Encounter for therapeutic drug level monitoring: Secondary | ICD-10-CM

## 2013-05-05 LAB — POCT INR: INR: 2.4

## 2013-05-23 ENCOUNTER — Ambulatory Visit (INDEPENDENT_AMBULATORY_CARE_PROVIDER_SITE_OTHER): Payer: Medicare Other | Admitting: Interventional Cardiology

## 2013-05-23 ENCOUNTER — Encounter: Payer: Self-pay | Admitting: Interventional Cardiology

## 2013-05-23 VITALS — BP 118/62 | HR 56 | Ht 66.0 in | Wt 203.0 lb

## 2013-05-23 DIAGNOSIS — I480 Paroxysmal atrial fibrillation: Secondary | ICD-10-CM

## 2013-05-23 DIAGNOSIS — Z5181 Encounter for therapeutic drug level monitoring: Secondary | ICD-10-CM

## 2013-05-23 DIAGNOSIS — Z7901 Long term (current) use of anticoagulants: Secondary | ICD-10-CM

## 2013-05-23 DIAGNOSIS — I4891 Unspecified atrial fibrillation: Secondary | ICD-10-CM

## 2013-05-23 DIAGNOSIS — I495 Sick sinus syndrome: Secondary | ICD-10-CM

## 2013-05-23 NOTE — Patient Instructions (Signed)
Your physician recommends that you continue on your current medications as directed. Please refer to the Current Medication list given to you today.  Your physician wants you to follow-up in: 1 year. You will receive a reminder letter in the mail two months in advance. If you don't receive a letter, please call our office to schedule the follow-up appointment.  

## 2013-05-23 NOTE — Progress Notes (Signed)
Patient ID: Rebecca Sandoval, female   DOB: 1935-05-10, 78 y.o.   MRN: [id number]    [address], [place]  [address] Phone: [phone] Fax:  [phone]  Date:  05/23/2013   ID:  Rebecca Sandoval, DOB 04/23/1935, MRN [id number]  PCP:  Vickii Penna., MD   ASSESSMENT:  1. Paroxysmal atrial fibrillation, controlled on flecainide 2. Flecainide therapy without complications 3. Chronic anticoagulation therapy  PLAN:  1. Continue current medical regimen 2. Discontinue Coumadin for the 5 days prior to dental work and resume at maintenance dose on the evening of the procedure unless instructed otherwise by the dentist 3. Clinical followup in one year   SUBJECTIVE: Rebecca Sandoval is a 78 y.o. female who has not had any recurrence of atrial fibrillation since starting flecainide 50 mg twice a day. She denies episodes of syncope and medication side effects. No change in exertional tolerance.   Wt Readings from Last 3 Encounters:  05/23/13 203 lb (92.08 kg)  11/22/12 203 lb (92.08 kg)  09/16/12 203 lb (92.08 kg)     Past Medical History  Diagnosis Date  . Obesity   . Anxiety   . GERD (gastroesophageal reflux disease)   . Paroxysmal atrial fibrillation   . Hemorrhoid   . Tachycardia-bradycardia   . Diverticulitis     Current Outpatient Prescriptions  Medication Sig Dispense Refill  . flecainide (TAMBOCOR) 50 MG tablet Take 1 tablet (50 mg total) by mouth 2 (two) times daily.  180 tablet  3  . fluticasone (FLONASE) 50 MCG/ACT nasal spray Place 1 spray into the nose as needed.       Marland Kitchen LORazepam (ATIVAN) 0.5 MG tablet Take 0.5 mg by mouth as needed.       . metoprolol (LOPRESSOR) 50 MG tablet Take 0.5 tablets (25 mg total) by mouth 2 (two) times daily.  30 tablet  6  . ondansetron (ZOFRAN) 4 MG tablet Take 4 mg by mouth as needed.       Marland Kitchen PARoxetine (PAXIL) 20 MG tablet Take 20 mg by mouth every morning.        . triamterene-hydrochlorothiazide  (MAXZIDE-25) 37.5-25 MG per tablet Take 1 tablet by mouth daily.        Marland Kitchen warfarin (COUMADIN) 5 MG tablet Take 5-7.5 mg by mouth daily. Take 7.5 mg all days except Monday and Friday.  On Monday and Friday, take 5 mg.       No current facility-administered medications for this visit.    Allergies:    Allergies  Allergen Reactions  . Prednisone     "pt stated this puts her in an uncomfortable zone"  . Propofol     Surgery complications.     Social History:  The patient  reports that she has quit smoking. She has never used smokeless tobacco. She reports that she does not drink alcohol or use illicit drugs.   ROS:  Please see the history of present illness.   No bleeding. No neurological complaints.   All other systems reviewed and negative.   OBJECTIVE: VS:  BP 118/62  Pulse 56  Ht $R'5\' 6"'RA$  (1.676 m)  Wt 203 lb (92.08 kg)  BMI 32.78 kg/m2  SpO2 93% Well nourished, well developed, in no acute distress, obese HEENT: normal Neck: JVD flat. Carotid bruit absent  Cardiac:  normal S1, S2; RRR; no murmur Lungs:  clear to auscultation bilaterally, no wheezing, rhonchi or rales Abd: soft, nontender, no hepatomegaly  Ext: Edema absent. Pulses 2+ and symmetric Skin: warm and dry Neuro:  CNs 2-12 intact, no focal abnormalities noted  EKG:  Not repeated       Signed, Illene Labrador III, MD 05/23/2013 3:18 PM

## 2013-06-09 ENCOUNTER — Ambulatory Visit (INDEPENDENT_AMBULATORY_CARE_PROVIDER_SITE_OTHER): Payer: Medicare Other | Admitting: Pharmacist

## 2013-06-09 DIAGNOSIS — Z5181 Encounter for therapeutic drug level monitoring: Secondary | ICD-10-CM

## 2013-06-09 DIAGNOSIS — I4891 Unspecified atrial fibrillation: Secondary | ICD-10-CM

## 2013-06-09 LAB — POCT INR: INR: 3.3

## 2013-07-15 NOTE — Telephone Encounter (Signed)
error 

## 2013-08-01 ENCOUNTER — Ambulatory Visit (INDEPENDENT_AMBULATORY_CARE_PROVIDER_SITE_OTHER): Payer: Medicare Other | Admitting: Pharmacist

## 2013-08-01 DIAGNOSIS — I4891 Unspecified atrial fibrillation: Secondary | ICD-10-CM

## 2013-08-01 DIAGNOSIS — Z5181 Encounter for therapeutic drug level monitoring: Secondary | ICD-10-CM

## 2013-08-01 LAB — POCT INR: INR: 2.7

## 2013-08-09 ENCOUNTER — Telehealth: Payer: Self-pay

## 2013-08-09 NOTE — Telephone Encounter (Signed)
Patient restarted warfarin on 08/07/13 following 5 teeth being extracted on 08/05/13.  On amoxicillin and hydrocodone now.  Patient advised to continue same coumadin dose and recheck INR 08/15/13 as scheduled.  She showed verbal understanding of plan.

## 2013-08-15 ENCOUNTER — Ambulatory Visit (INDEPENDENT_AMBULATORY_CARE_PROVIDER_SITE_OTHER): Payer: Medicare Other | Admitting: Pharmacist

## 2013-08-15 DIAGNOSIS — I4891 Unspecified atrial fibrillation: Secondary | ICD-10-CM

## 2013-08-15 DIAGNOSIS — Z5181 Encounter for therapeutic drug level monitoring: Secondary | ICD-10-CM

## 2013-08-15 LAB — POCT INR: INR: 2

## 2013-09-03 ENCOUNTER — Other Ambulatory Visit: Payer: Self-pay | Admitting: Internal Medicine

## 2013-09-05 ENCOUNTER — Ambulatory Visit (INDEPENDENT_AMBULATORY_CARE_PROVIDER_SITE_OTHER): Payer: Medicare Other | Admitting: *Deleted

## 2013-09-05 DIAGNOSIS — Z5181 Encounter for therapeutic drug level monitoring: Secondary | ICD-10-CM

## 2013-09-05 DIAGNOSIS — I4891 Unspecified atrial fibrillation: Secondary | ICD-10-CM

## 2013-09-05 LAB — POCT INR: INR: 4.8

## 2013-09-06 ENCOUNTER — Other Ambulatory Visit: Payer: Self-pay | Admitting: Internal Medicine

## 2013-09-19 ENCOUNTER — Ambulatory Visit (INDEPENDENT_AMBULATORY_CARE_PROVIDER_SITE_OTHER): Payer: Medicare Other | Admitting: *Deleted

## 2013-09-19 DIAGNOSIS — Z5181 Encounter for therapeutic drug level monitoring: Secondary | ICD-10-CM

## 2013-09-19 DIAGNOSIS — I4891 Unspecified atrial fibrillation: Secondary | ICD-10-CM

## 2013-09-19 LAB — POCT INR: INR: 2

## 2013-10-28 ENCOUNTER — Ambulatory Visit (INDEPENDENT_AMBULATORY_CARE_PROVIDER_SITE_OTHER): Payer: Medicare Other | Admitting: *Deleted

## 2013-10-28 DIAGNOSIS — Z5181 Encounter for therapeutic drug level monitoring: Secondary | ICD-10-CM

## 2013-10-28 DIAGNOSIS — I4891 Unspecified atrial fibrillation: Secondary | ICD-10-CM

## 2013-10-28 LAB — POCT INR: INR: 2.7

## 2013-11-03 ENCOUNTER — Other Ambulatory Visit: Payer: Self-pay | Admitting: *Deleted

## 2013-11-03 MED ORDER — WARFARIN SODIUM 5 MG PO TABS: ORAL_TABLET | ORAL | Status: DC

## 2013-11-03 NOTE — Telephone Encounter (Signed)
Refill done as requested 

## 2013-11-25 ENCOUNTER — Ambulatory Visit (INDEPENDENT_AMBULATORY_CARE_PROVIDER_SITE_OTHER): Payer: Medicare Other

## 2013-11-25 DIAGNOSIS — Z5181 Encounter for therapeutic drug level monitoring: Secondary | ICD-10-CM

## 2013-11-25 DIAGNOSIS — I4891 Unspecified atrial fibrillation: Secondary | ICD-10-CM

## 2013-11-25 LAB — POCT INR: INR: 2

## 2013-11-28 ENCOUNTER — Other Ambulatory Visit: Payer: Self-pay | Admitting: *Deleted

## 2013-11-28 MED ORDER — METOPROLOL TARTRATE 50 MG PO TABS: 25.0000 mg | ORAL_TABLET | Freq: Two times a day (BID) | ORAL | Status: DC

## 2013-12-06 ENCOUNTER — Other Ambulatory Visit: Payer: Self-pay

## 2013-12-06 MED ORDER — FLECAINIDE ACETATE 50 MG PO TABS: ORAL_TABLET | ORAL | Status: DC

## 2014-01-18 ENCOUNTER — Ambulatory Visit (INDEPENDENT_AMBULATORY_CARE_PROVIDER_SITE_OTHER): Payer: Medicare Other | Admitting: *Deleted

## 2014-01-18 DIAGNOSIS — I4891 Unspecified atrial fibrillation: Secondary | ICD-10-CM

## 2014-01-18 DIAGNOSIS — Z5181 Encounter for therapeutic drug level monitoring: Secondary | ICD-10-CM

## 2014-01-18 LAB — POCT INR: INR: 2.6

## 2014-02-05 ENCOUNTER — Other Ambulatory Visit: Payer: Self-pay | Admitting: Interventional Cardiology

## 2014-02-06 ENCOUNTER — Other Ambulatory Visit: Payer: Self-pay

## 2014-02-06 MED ORDER — FLECAINIDE ACETATE 50 MG PO TABS: ORAL_TABLET | ORAL | Status: DC

## 2014-02-10 ENCOUNTER — Other Ambulatory Visit: Payer: Self-pay

## 2014-02-10 MED ORDER — PAROXETINE HCL 20 MG PO TABS: 20.0000 mg | ORAL_TABLET | ORAL | Status: DC

## 2014-02-10 MED ORDER — TRIAMTERENE-HCTZ 37.5-25 MG PO TABS: 1.0000 | ORAL_TABLET | Freq: Every day | ORAL | Status: DC

## 2014-02-10 MED ORDER — METOPROLOL TARTRATE 50 MG PO TABS: 25.0000 mg | ORAL_TABLET | Freq: Two times a day (BID) | ORAL | Status: DC

## 2014-02-20 ENCOUNTER — Ambulatory Visit (INDEPENDENT_AMBULATORY_CARE_PROVIDER_SITE_OTHER): Payer: Medicare Other | Admitting: *Deleted

## 2014-02-20 DIAGNOSIS — I4891 Unspecified atrial fibrillation: Secondary | ICD-10-CM

## 2014-02-20 DIAGNOSIS — Z5181 Encounter for therapeutic drug level monitoring: Secondary | ICD-10-CM

## 2014-03-24 DIAGNOSIS — Z23 Encounter for immunization: Secondary | ICD-10-CM | POA: Diagnosis not present

## 2014-04-11 ENCOUNTER — Ambulatory Visit (INDEPENDENT_AMBULATORY_CARE_PROVIDER_SITE_OTHER): Payer: Medicare Other | Admitting: *Deleted

## 2014-04-11 DIAGNOSIS — Z5181 Encounter for therapeutic drug level monitoring: Secondary | ICD-10-CM | POA: Diagnosis not present

## 2014-04-11 DIAGNOSIS — I4891 Unspecified atrial fibrillation: Secondary | ICD-10-CM

## 2014-04-11 LAB — POCT INR: INR: 2.6

## 2014-04-11 MED ORDER — WARFARIN SODIUM 5 MG PO TABS: ORAL_TABLET | ORAL | Status: DC

## 2014-04-17 ENCOUNTER — Telehealth: Payer: Self-pay | Admitting: Interventional Cardiology

## 2014-04-17 NOTE — Telephone Encounter (Signed)
New Msg       1. What dental office are you calling from? Pt calling will be going to [facility]   2. What is your office phone and fax number? Fax [phone] phn number not avail.  3. What type of procedure is the patient having performed? Pt will have two extractions    4. What date is procedure scheduled? 04/21/14   5. What is your question (ex. Antibiotics prior to procedure, holding medication-we need to know how long dentist wants pt to hold med)? Pt needs to hold Warfarin and will stop it today.    Please fax approval to [facility] to Dr. Bufford Lope.

## 2014-04-17 NOTE — Telephone Encounter (Signed)
I agree with the message as relayed by the patient. After the extraction, she should resume the typical dosing pattern.

## 2014-04-17 NOTE — Telephone Encounter (Signed)
Pt called in regards for needing approval to stop taking Coumadin for teeth extraction on 04/21/14. Pt stated that the last time she had her teeth extracted Dr. Tamala Julian told her to stop 4 days prior to extraction. Pt stated that she is going to stop her Coumadin today since it is four days prior but that [facility] needed an approval letter sent over for stopping coumadin. Informed pt that I will send a message to Dr. Tamala Julian for instructions and advisement. Pt verbalized understanding and was in agreement with this plan.

## 2014-04-18 NOTE — Telephone Encounter (Signed)
Called pt and left message letting her know that I will be faxing over the papers requested and to give [place] a call back if she had any questions.

## 2014-05-23 ENCOUNTER — Ambulatory Visit (INDEPENDENT_AMBULATORY_CARE_PROVIDER_SITE_OTHER): Payer: Medicare Other | Admitting: *Deleted

## 2014-05-23 DIAGNOSIS — I4891 Unspecified atrial fibrillation: Secondary | ICD-10-CM

## 2014-05-23 DIAGNOSIS — Z5181 Encounter for therapeutic drug level monitoring: Secondary | ICD-10-CM

## 2014-05-23 LAB — POCT INR: INR: 2.5

## 2014-05-26 ENCOUNTER — Other Ambulatory Visit: Payer: Self-pay | Admitting: Interventional Cardiology

## 2014-05-28 ENCOUNTER — Other Ambulatory Visit: Payer: Self-pay | Admitting: Interventional Cardiology

## 2014-05-30 ENCOUNTER — Telehealth: Payer: Self-pay

## 2014-05-30 NOTE — Telephone Encounter (Signed)
Refill refuse

## 2014-05-30 NOTE — Telephone Encounter (Signed)
We are not responsible for the Paxil

## 2014-06-04 ENCOUNTER — Other Ambulatory Visit: Payer: Self-pay | Admitting: Interventional Cardiology

## 2014-06-15 ENCOUNTER — Ambulatory Visit (INDEPENDENT_AMBULATORY_CARE_PROVIDER_SITE_OTHER): Payer: Medicare Other | Admitting: Interventional Cardiology

## 2014-06-15 ENCOUNTER — Encounter: Payer: Self-pay | Admitting: Interventional Cardiology

## 2014-06-15 VITALS — BP 124/80 | HR 45 | Ht 66.0 in | Wt 195.1 lb

## 2014-06-15 DIAGNOSIS — I495 Sick sinus syndrome: Secondary | ICD-10-CM

## 2014-06-15 DIAGNOSIS — I4891 Unspecified atrial fibrillation: Secondary | ICD-10-CM | POA: Diagnosis not present

## 2014-06-15 DIAGNOSIS — Z5181 Encounter for therapeutic drug level monitoring: Secondary | ICD-10-CM | POA: Diagnosis not present

## 2014-06-15 DIAGNOSIS — Z7901 Long term (current) use of anticoagulants: Secondary | ICD-10-CM | POA: Diagnosis not present

## 2014-06-15 DIAGNOSIS — I480 Paroxysmal atrial fibrillation: Secondary | ICD-10-CM | POA: Diagnosis not present

## 2014-06-15 NOTE — Patient Instructions (Addendum)
Your physician recommends that you continue on your current medications as directed. Please refer to the Current Medication list given to you today.  Your physician wants you to follow-up in: 1 year with Dr.Smith You will receive a reminder letter in the mail two months in advance. If you don't receive a letter, please call our office to schedule the follow-up appointment.  

## 2014-06-15 NOTE — Progress Notes (Signed)
Cardiology Office Note   Date:  06/15/2014   ID:  Rebecca Sandoval, DOB [birth year], MRN [id number]  PCP:  Vickii Penna., MD  Cardiologist:   Sinclair Grooms, MD   No chief complaint on file.     History of Present Illness: Rebecca Sandoval is a 79 y.o. female who presents for tachycardia-bradycardia syndrome with a history of paroxysmal atrial fibrillation. She is currently doing well. His states that there've been no episodes of tachycardia in the interim since the last visit. She has had no blood in the urine or stool. She has not had lightheadedness or dizziness.    Past Medical History  Diagnosis Date  . Obesity   . Anxiety   . GERD (gastroesophageal reflux disease)   . Paroxysmal atrial fibrillation   . Hemorrhoid   . Tachycardia-bradycardia   . Diverticulitis     Past Surgical History  Procedure Laterality Date  . Abdominal hysterectomy  1975  . Bartholyn cyst  1973  . Bilateral temporomandibular joint arthroplasty  1977  . Diverticulitis  1986  . Shoulder surgery      left 97, rigth 98  . Hernia repair  1996    hiatal  . Appendectomy    . Tonsillectomy       Current Outpatient Prescriptions  Medication Sig Dispense Refill  . flecainide (TAMBOCOR) 50 MG tablet TAKE ONE TABLET BY MOUTH TWICE DAILY 180 tablet 2  . fluticasone (FLONASE) 50 MCG/ACT nasal spray Place 1 spray into the nose as needed.     Marland Kitchen LORazepam (ATIVAN) 0.5 MG tablet Take 0.5 mg by mouth as needed.     . metoprolol (LOPRESSOR) 50 MG tablet Take one-half tablet by  mouth twice a day 30 tablet 0  . PARoxetine (PAXIL) 20 MG tablet Take 1 tablet (20 mg total) by mouth every morning. 90 tablet 0  . triamterene-hydrochlorothiazide (MAXZIDE-25) 37.5-25 MG per tablet Take 1 tablet by mouth  daily 90 tablet 1  . warfarin (COUMADIN) 5 MG tablet TAKE AS DIRECTED BY  COUMADIN CLINIC 150 tablet 0   No current facility-administered medications for this visit.    Allergies:   Tetanus toxoids;  Prednisone; and Propofol    Social History:  The patient  reports that she has quit smoking. She has never used smokeless tobacco. She reports that she does not drink alcohol or use illicit drugs.   Family History:  The patient's family history includes Cancer in her paternal aunt and paternal uncle; Stroke (age of onset: 55) in her mother.    ROS:  Please see the history of present illness.   Otherwise, review of systems are positive for none.   All other systems are reviewed and negative.    PHYSICAL EXAM: VS:  BP 124/80 mmHg  Pulse 45  Ht $R'5\' 6"'kg$  (1.676 m)  Wt 195 lb 1.9 oz (88.506 kg)  BMI 31.51 kg/m2 , BMI Body mass index is 31.51 kg/(m^2). heart rate while standing 56 bpm GEN: Well nourished, well developed, in no acute distress HEENT: normal Neck: no JVD, carotid bruits, or masses Cardiac: RRR; no murmurs, rubs, or gallops,no edema  Respiratory:  clear to auscultation bilaterally, normal work of breathing GI: soft, nontender, nondistended, + BS MS: no deformity or atrophy Skin: warm and dry, no rash Neuro:  Strength and sensation are intact Psych: euthymic mood, full affect   EKG:  EKG is ordered today. The ekg ordered today demonstrates sinus bradycardia at 45 bpm  Recent Labs: No results found for requested labs within last 365 days.    Lipid Panel No results found for: CHOL, TRIG, HDL, CHOLHDL, VLDL, LDLCALC, LDLDIRECT    Wt Readings from Last 3 Encounters:  06/15/14 195 lb 1.9 oz (88.506 kg)  05/23/13 203 lb (92.08 kg)  11/22/12 203 lb (92.08 kg)      Other studies Reviewed: Additional studies/ records that were reviewed today include: .    ASSESSMENT AND PLAN:  Paroxysmal atrial fibrillation: No clinical episodes.  Tachycardia-bradycardia syndrome: Asymptomatic  Encounter for therapeutic drug monitoring: Flecainide therapy without complications  Chronic anticoagulation: Coumadin therapy without bleeding     Current medicines are reviewed at  length with the patient today.  The patient does not have concerns regarding medicines.  The following changes have been made:  no change  Labs/ tests ordered today include:   Orders Placed This Encounter  Procedures  . EKG 12-Lead     Disposition:   FU with Linard Millers in 1 year   Signed, Sinclair Grooms, MD  06/15/2014 8:26 PM    [address], [place], [place]  [address] Phone: [phone]; Fax: [phone]

## 2014-06-29 ENCOUNTER — Other Ambulatory Visit: Payer: Self-pay | Admitting: Radiology

## 2014-07-02 ENCOUNTER — Other Ambulatory Visit: Payer: Self-pay | Admitting: Interventional Cardiology

## 2014-07-03 NOTE — Telephone Encounter (Signed)
I am not responsible for her Paxil. Needs to go to PCP.

## 2014-07-04 ENCOUNTER — Ambulatory Visit (INDEPENDENT_AMBULATORY_CARE_PROVIDER_SITE_OTHER): Payer: Medicare Other | Admitting: *Deleted

## 2014-07-04 DIAGNOSIS — Z5181 Encounter for therapeutic drug level monitoring: Secondary | ICD-10-CM | POA: Diagnosis not present

## 2014-07-04 DIAGNOSIS — I4891 Unspecified atrial fibrillation: Secondary | ICD-10-CM | POA: Diagnosis not present

## 2014-07-04 LAB — POCT INR: INR: 1.7

## 2014-07-10 ENCOUNTER — Other Ambulatory Visit: Payer: Self-pay | Admitting: General Surgery

## 2014-07-10 DIAGNOSIS — D0502 Lobular carcinoma in situ of left breast: Secondary | ICD-10-CM

## 2014-07-16 ENCOUNTER — Other Ambulatory Visit: Payer: Self-pay | Admitting: Interventional Cardiology

## 2014-07-18 ENCOUNTER — Ambulatory Visit (INDEPENDENT_AMBULATORY_CARE_PROVIDER_SITE_OTHER): Payer: Medicare Other | Admitting: *Deleted

## 2014-07-18 DIAGNOSIS — I4891 Unspecified atrial fibrillation: Secondary | ICD-10-CM | POA: Diagnosis not present

## 2014-07-18 DIAGNOSIS — Z5181 Encounter for therapeutic drug level monitoring: Secondary | ICD-10-CM

## 2014-07-18 LAB — POCT INR: INR: 2.1

## 2014-07-18 NOTE — Telephone Encounter (Signed)
This is not something I would have prescribed. She needs to have her PCP do this.

## 2014-07-19 ENCOUNTER — Other Ambulatory Visit: Payer: Self-pay | Admitting: Interventional Cardiology

## 2014-07-19 MED ORDER — PAROXETINE HCL 20 MG PO TABS: 20.0000 mg | ORAL_TABLET | ORAL | Status: DC

## 2014-07-24 ENCOUNTER — Encounter (HOSPITAL_COMMUNITY)
Admission: RE | Admit: 2014-07-24 | Discharge: 2014-07-24 | Disposition: A | Discharge status: Home / Self Care | Payer: Medicare Other | Source: Ambulatory Visit | Attending: General Surgery | Admitting: General Surgery

## 2014-07-24 DIAGNOSIS — Z87891 Personal history of nicotine dependence: Secondary | ICD-10-CM | POA: Diagnosis not present

## 2014-07-24 DIAGNOSIS — D0502 Lobular carcinoma in situ of left breast: Secondary | ICD-10-CM | POA: Diagnosis not present

## 2014-07-24 DIAGNOSIS — I4891 Unspecified atrial fibrillation: Secondary | ICD-10-CM | POA: Diagnosis not present

## 2014-07-24 LAB — CBC
HEMATOCRIT: 44.3 % (ref 36.0–46.0)
Hemoglobin: 15.1 g/dL — ABNORMAL HIGH (ref 12.0–15.0)
MCH: 31.9 pg (ref 26.0–34.0)
MCHC: 34.1 g/dL (ref 30.0–36.0)
MCV: 93.7 fL (ref 78.0–100.0)
PLATELETS: 199 10*3/uL (ref 150–400)
RBC: 4.73 MIL/uL (ref 3.87–5.11)
RDW: 13.6 % (ref 11.5–15.5)
WBC: 7.3 10*3/uL (ref 4.0–10.5)

## 2014-07-24 LAB — BASIC METABOLIC PANEL
Anion gap: 7 (ref 5–15)
BUN: 25 mg/dL — AB (ref 6–20)
CO2: 27 mmol/L (ref 22–32)
Calcium: 9.3 mg/dL (ref 8.9–10.3)
Chloride: 106 mmol/L (ref 101–111)
Creatinine, Ser: 1.42 mg/dL — ABNORMAL HIGH (ref 0.44–1.00)
GFR calc Af Amer: 40 mL/min — ABNORMAL LOW (ref >60)
GFR calc non Af Amer: 35 mL/min — ABNORMAL LOW (ref >60)
GLUCOSE: 104 mg/dL — AB (ref 70–99)
Potassium: 4.3 mmol/L (ref 3.5–5.1)
Sodium: 140 mmol/L (ref 135–145)

## 2014-07-24 LAB — PROTIME-INR
INR: 1.78 — ABNORMAL HIGH (ref 0.00–1.49)
Prothrombin Time: 20.9 seconds — ABNORMAL HIGH (ref 11.6–15.2)

## 2014-07-24 NOTE — Pre-Procedure Instructions (Signed)
Rebecca Sandoval  07/24/2014   Your procedure is scheduled on:  Jul 25, 2014  Report to [facility] Admitting at 7:30 AM.  Call this number if you have problems the morning of surgery: [phone]   Remember:   Do not eat food or drink liquids after midnight.   Take these medicines the morning of surgery with A SIP OF WATER: flecainide (TAMBOCOR), metoprolol (LOPRESSOR), PARoxetine (PAXIL)   IF NEEDED: acetaminophen (TYLENOL), LORazepam (ATIVAN)   COUMADIN AS DIRECTED   Do not wear jewelry, make-up or nail polish.  Do not wear lotions, powders, or perfumes. You may wear deodorant.  Do not shave 48 hours prior to surgery.  Do not bring valuables to the hospital.  [facility] is not responsible  for any belongings or valuables.               Contacts, dentures or bridgework may not be worn into surgery.  Leave suitcase in the car. After surgery it may be brought to your room.  For patients admitted to the hospital, discharge time is determined by your treatment team.               Patients discharged the day of surgery will not be allowed to drive  home.  Name and phone number of your driver: FAMILY/FRIEND  Special Instructions: "PREPARING FOR SURGERY"   Please read over the following fact sheets that you were given: Pain Booklet, Coughing and Deep Breathing and Surgical Site Infection Prevention

## 2014-07-24 NOTE — Progress Notes (Signed)
Pt states "I have not been instructed to stop my coumadin"  I Spoke with Lattie Haw and Dr Tamala Julian office and Arlyce Dice at Dr Barry Dienes office.  Plan to draw labs today and reschedule surgery for Friday per [facility].  Pt was instructed to anticipate a call from [facility] regarding arrival time for surgery on Friday.

## 2014-07-25 DIAGNOSIS — Z87891 Personal history of nicotine dependence: Secondary | ICD-10-CM | POA: Diagnosis not present

## 2014-07-25 DIAGNOSIS — I4891 Unspecified atrial fibrillation: Secondary | ICD-10-CM | POA: Diagnosis not present

## 2014-07-25 DIAGNOSIS — D0502 Lobular carcinoma in situ of left breast: Secondary | ICD-10-CM | POA: Diagnosis not present

## 2014-07-26 NOTE — Progress Notes (Signed)
Left message on pt's voicemail notifying her of time change. Instructed pt to be here Friday, 07/28/14 at 7:00 AM. Requested pt return my call to let me know she received the message.

## 2014-07-27 MED ORDER — CEFAZOLIN SODIUM-DEXTROSE 2-3 GM-% IV SOLR
2.0000 g | INTRAVENOUS | Status: AC
  Administered 2014-07-28: 2 g via INTRAVENOUS
  Filled 2014-07-27: qty 50

## 2014-07-28 ENCOUNTER — Ambulatory Visit (HOSPITAL_COMMUNITY)
Admission: RE | Admit: 2014-07-28 | Discharge: 2014-07-28 | Disposition: A | Discharge status: Home / Self Care | Payer: Medicare Other | Source: Ambulatory Visit | Attending: General Surgery | Admitting: General Surgery

## 2014-07-28 ENCOUNTER — Encounter (HOSPITAL_COMMUNITY)
Admission: RE | Disposition: A | Discharge status: Home / Self Care | Payer: Self-pay | Source: Ambulatory Visit | Attending: General Surgery

## 2014-07-28 ENCOUNTER — Ambulatory Visit (HOSPITAL_COMMUNITY): Payer: Medicare Other | Admitting: Certified Registered"

## 2014-07-28 ENCOUNTER — Encounter (HOSPITAL_COMMUNITY): Payer: Self-pay | Admitting: *Deleted

## 2014-07-28 DIAGNOSIS — D0502 Lobular carcinoma in situ of left breast: Secondary | ICD-10-CM | POA: Insufficient documentation

## 2014-07-28 DIAGNOSIS — I4891 Unspecified atrial fibrillation: Secondary | ICD-10-CM | POA: Diagnosis not present

## 2014-07-28 DIAGNOSIS — Z87891 Personal history of nicotine dependence: Secondary | ICD-10-CM | POA: Diagnosis not present

## 2014-07-28 HISTORY — PX: BREAST LUMPECTOMY: SHX2

## 2014-07-28 HISTORY — DX: Other complications of anesthesia, initial encounter: T88.59XA

## 2014-07-28 HISTORY — DX: Adverse effect of unspecified anesthetic, initial encounter: T41.45XA

## 2014-07-28 HISTORY — PX: BREAST LUMPECTOMY WITH RADIOACTIVE SEED LOCALIZATION: SHX6424

## 2014-07-28 LAB — PROTIME-INR
INR: 1.01 (ref 0.00–1.49)
Prothrombin Time: 13.4 seconds (ref 11.6–15.2)

## 2014-07-28 SURGERY — BREAST LUMPECTOMY WITH RADIOACTIVE SEED LOCALIZATION
Anesthesia: General | Site: Breast | Laterality: Left

## 2014-07-28 MED ORDER — LIDOCAINE HCL 1 % IJ SOLN
INTRAMUSCULAR | Status: DC | PRN
  Administered 2014-07-28: 6 mL

## 2014-07-28 MED ORDER — FENTANYL CITRATE (PF) 100 MCG/2ML IJ SOLN
INTRAMUSCULAR | Status: DC | PRN
  Administered 2014-07-28: 25 ug via INTRAVENOUS
  Administered 2014-07-28: 100 ug via INTRAVENOUS

## 2014-07-28 MED ORDER — SCOPOLAMINE 1 MG/3DAYS TD PT72
MEDICATED_PATCH | TRANSDERMAL | Status: DC
Start: 2014-07-28 — End: 2014-07-28
  Filled 2014-07-28: qty 1

## 2014-07-28 MED ORDER — ACETAMINOPHEN 650 MG RE SUPP
650.0000 mg | RECTAL | Status: DC | PRN
  Filled 2014-07-28: qty 1

## 2014-07-28 MED ORDER — HYDROMORPHONE HCL 1 MG/ML IJ SOLN
0.2500 mg | INTRAMUSCULAR | Status: DC | PRN
  Administered 2014-07-28 (×5): 0.5 mg via INTRAVENOUS

## 2014-07-28 MED ORDER — PROPOFOL 10 MG/ML IV BOLUS
INTRAVENOUS | Status: AC
  Filled 2014-07-28: qty 20

## 2014-07-28 MED ORDER — EPHEDRINE SULFATE 50 MG/ML IJ SOLN
INTRAMUSCULAR | Status: DC | PRN
  Administered 2014-07-28: 10 mg via INTRAVENOUS

## 2014-07-28 MED ORDER — LIDOCAINE HCL (CARDIAC) 20 MG/ML IV SOLN
INTRAVENOUS | Status: DC | PRN
  Administered 2014-07-28: 80 mg via INTRAVENOUS

## 2014-07-28 MED ORDER — ACETAMINOPHEN 325 MG PO TABS
650.0000 mg | ORAL_TABLET | ORAL | Status: DC | PRN
  Administered 2014-07-28: 650 mg via ORAL
  Filled 2014-07-28 (×2): qty 2

## 2014-07-28 MED ORDER — SODIUM CHLORIDE 0.9 % IJ SOLN: 3.0000 mL | INTRAMUSCULAR | Status: DC | PRN

## 2014-07-28 MED ORDER — HYDROMORPHONE HCL 1 MG/ML IJ SOLN
INTRAMUSCULAR | Status: AC
  Filled 2014-07-28: qty 1

## 2014-07-28 MED ORDER — PROPOFOL 10 MG/ML IV BOLUS
INTRAVENOUS | Status: DC | PRN
  Administered 2014-07-28: 180 mg via INTRAVENOUS

## 2014-07-28 MED ORDER — LIDOCAINE HCL (PF) 1 % IJ SOLN
INTRAMUSCULAR | Status: AC
  Filled 2014-07-28: qty 30

## 2014-07-28 MED ORDER — PROMETHAZINE HCL 25 MG/ML IJ SOLN: 6.2500 mg | INTRAMUSCULAR | Status: DC | PRN

## 2014-07-28 MED ORDER — ACETAMINOPHEN 325 MG PO TABS
ORAL_TABLET | ORAL | Status: AC
  Administered 2014-07-28: 650 mg via ORAL
  Filled 2014-07-28: qty 2

## 2014-07-28 MED ORDER — OXYCODONE-ACETAMINOPHEN 5-325 MG PO TABS: 1.0000 | ORAL_TABLET | ORAL | Status: DC | PRN

## 2014-07-28 MED ORDER — HYDROMORPHONE HCL 1 MG/ML IJ SOLN
INTRAMUSCULAR | Status: AC
  Administered 2014-07-28: 0.5 mg via INTRAVENOUS
  Filled 2014-07-28: qty 1

## 2014-07-28 MED ORDER — BUPIVACAINE-EPINEPHRINE (PF) 0.25% -1:200000 IJ SOLN
INTRAMUSCULAR | Status: AC
  Filled 2014-07-28: qty 30

## 2014-07-28 MED ORDER — PHENYLEPHRINE HCL 10 MG/ML IJ SOLN
INTRAMUSCULAR | Status: DC | PRN
  Administered 2014-07-28 (×2): 80 ug via INTRAVENOUS

## 2014-07-28 MED ORDER — LACTATED RINGERS IV SOLN
INTRAVENOUS | Status: DC
  Administered 2014-07-28 (×2): via INTRAVENOUS

## 2014-07-28 MED ORDER — SCOPOLAMINE 1 MG/3DAYS TD PT72
1.0000 | MEDICATED_PATCH | TRANSDERMAL | Status: DC
  Administered 2014-07-28: 1.5 mg via TRANSDERMAL

## 2014-07-28 MED ORDER — SODIUM CHLORIDE 0.9 % IV SOLN
250.0000 mL | INTRAVENOUS | Status: DC | PRN
Start: 2014-07-28 — End: 2014-07-28

## 2014-07-28 MED ORDER — SODIUM CHLORIDE 0.9 % IJ SOLN
3.0000 mL | Freq: Two times a day (BID) | INTRAMUSCULAR | Status: DC
Start: 2014-07-28 — End: 2014-07-28

## 2014-07-28 MED ORDER — OXYCODONE HCL 5 MG PO TABS
ORAL_TABLET | ORAL | Status: AC
  Administered 2014-07-28: 10 mg via ORAL
  Filled 2014-07-28: qty 2

## 2014-07-28 MED ORDER — FENTANYL CITRATE (PF) 250 MCG/5ML IJ SOLN
INTRAMUSCULAR | Status: AC
  Filled 2014-07-28: qty 5

## 2014-07-28 MED ORDER — OXYCODONE HCL 5 MG PO TABS
5.0000 mg | ORAL_TABLET | ORAL | Status: DC | PRN
  Administered 2014-07-28: 10 mg via ORAL
  Filled 2014-07-28: qty 2

## 2014-07-28 MED ORDER — 0.9 % SODIUM CHLORIDE (POUR BTL) OPTIME
TOPICAL | Status: DC | PRN
  Administered 2014-07-28: 1000 mL

## 2014-07-28 SURGICAL SUPPLY — 50 items
APPLIER CLIP 9.375 MED OPEN (MISCELLANEOUS)
APR CLP MED 9.3 20 MLT OPN (MISCELLANEOUS)
BINDER BREAST LRG (GAUZE/BANDAGES/DRESSINGS) IMPLANT
BINDER BREAST XLRG (GAUZE/BANDAGES/DRESSINGS) ×2 IMPLANT
BLADE SURG 15 STRL LF DISP TIS (BLADE) ×1 IMPLANT
BLADE SURG 15 STRL SS (BLADE) ×3
CANISTER SUCTION 2500CC (MISCELLANEOUS) IMPLANT
CHLORAPREP W/TINT 26ML (MISCELLANEOUS) ×3 IMPLANT
CLIP APPLIE 9.375 MED OPEN (MISCELLANEOUS) IMPLANT
COVER PROBE W GEL 5X96 (DRAPES) ×3 IMPLANT
COVER SURGICAL LIGHT HANDLE (MISCELLANEOUS) ×2 IMPLANT
DEVICE DUBIN SPECIMEN MAMMOGRA (MISCELLANEOUS) ×3 IMPLANT
DRAPE CHEST BREAST 15X10 FENES (DRAPES) ×3 IMPLANT
DRAPE UTILITY XL STRL (DRAPES) ×2 IMPLANT
DRSG PAD ABDOMINAL 8X10 ST (GAUZE/BANDAGES/DRESSINGS) ×2 IMPLANT
ELECT CAUTERY BLADE 6.4 (BLADE) ×3 IMPLANT
ELECT REM PT RETURN 9FT ADLT (ELECTROSURGICAL) ×3
ELECTRODE REM PT RTRN 9FT ADLT (ELECTROSURGICAL) ×1 IMPLANT
GLOVE BIO SURGEON STRL SZ 6 (GLOVE) ×3 IMPLANT
GLOVE BIO SURGEON STRL SZ7.5 (GLOVE) ×2 IMPLANT
GLOVE BIOGEL PI IND STRL 6.5 (GLOVE) ×1 IMPLANT
GLOVE BIOGEL PI IND STRL 7.5 (GLOVE) IMPLANT
GLOVE BIOGEL PI INDICATOR 6.5 (GLOVE) ×2
GLOVE BIOGEL PI INDICATOR 7.5 (GLOVE) ×2
GLOVE SURG SS PI 7.0 STRL IVOR (GLOVE) ×2 IMPLANT
GOWN STRL REUS W/ TWL LRG LVL3 (GOWN DISPOSABLE) ×1 IMPLANT
GOWN STRL REUS W/TWL 2XL LVL3 (GOWN DISPOSABLE) ×3 IMPLANT
GOWN STRL REUS W/TWL LRG LVL3 (GOWN DISPOSABLE) ×3
KIT BASIN OR (CUSTOM PROCEDURE TRAY) ×3 IMPLANT
KIT MARKER MARGIN INK (KITS) ×3 IMPLANT
LIQUID BAND (GAUZE/BANDAGES/DRESSINGS) ×2 IMPLANT
NDL HYPO 25X1 1.5 SAFETY (NEEDLE) ×1 IMPLANT
NEEDLE HYPO 25X1 1.5 SAFETY (NEEDLE) ×3 IMPLANT
NS IRRIG 1000ML POUR BTL (IV SOLUTION) IMPLANT
PACK SURGICAL SETUP 50X90 (CUSTOM PROCEDURE TRAY) ×3 IMPLANT
PENCIL BUTTON HOLSTER BLD 10FT (ELECTRODE) ×3 IMPLANT
SPONGE LAP 18X18 X RAY DECT (DISPOSABLE) ×3 IMPLANT
SUT MNCRL AB 4-0 PS2 18 (SUTURE) ×3 IMPLANT
SUT SILK 2 0 SH (SUTURE) IMPLANT
SUT VIC AB 2-0 SH 27 (SUTURE)
SUT VIC AB 2-0 SH 27XBRD (SUTURE) ×1 IMPLANT
SUT VIC AB 3-0 SH 27 (SUTURE) ×3
SUT VIC AB 3-0 SH 27X BRD (SUTURE) ×1 IMPLANT
SUT VIC AB 3-0 SH 8-18 (SUTURE) ×2 IMPLANT
SYR CONTROL 10ML LL (SYRINGE) ×3 IMPLANT
TOWEL OR 17X24 6PK STRL BLUE (TOWEL DISPOSABLE) ×3 IMPLANT
TOWEL OR 17X26 10 PK STRL BLUE (TOWEL DISPOSABLE) ×3 IMPLANT
TUBE CONNECTING 12'X1/4 (SUCTIONS)
TUBE CONNECTING 12X1/4 (SUCTIONS) IMPLANT
YANKAUER SUCT BULB TIP NO VENT (SUCTIONS) IMPLANT

## 2014-07-28 NOTE — Interval H&P Note (Signed)
History and Physical Interval Note:  07/28/2014 8:34 AM  Rebecca Sandoval  has presented today for surgery, with the diagnosis of Left Breast Lobular Carcinoma Insitu  The various methods of treatment have been discussed with the patient and family. After consideration of risks, benefits and other options for treatment, the patient has consented to  Procedure(s): LEFT BREAST LUMPECTOMY WITH RADIOACTIVE SEED LOCALIZATION (Left) as a surgical intervention .  The patient's history has been reviewed, patient examined, no change in status, stable for surgery.  I have reviewed the patient's chart and labs.  Questions were answered to the patient's satisfaction.     Kadia Abaya

## 2014-07-28 NOTE — Progress Notes (Signed)
Pt up in recliner,family here. Pt has pain that "comes & goes".  Seems anxious.She wants to go home. Has had $Remov'10mg'JBOHmt$  po OXY IR in PACU. Reassurance and dc instructions reviewed. Family & pt feel she is OK to DC.

## 2014-07-28 NOTE — Transfer of Care (Signed)
Immediate Anesthesia Transfer of Care Note  Patient: Rebecca Sandoval  Procedure(s) Performed: Procedure(s): LEFT BREAST LUMPECTOMY WITH RADIOACTIVE SEED LOCALIZATION (Left)  Patient Location: PACU  Anesthesia Type:General  Level of Consciousness: awake, alert  and oriented  Airway & Oxygen Therapy: Patient Spontanous Breathing and Patient connected to nasal cannula oxygen  Post-op Assessment: Report given to RN, Post -op Vital signs reviewed and stable and Patient moving all extremities X 4  Post vital signs: Reviewed and stable  Last Vitals:  Filed Vitals:   07/28/14 0656  BP: 150/58  Pulse: 48  Temp: 36.1 C  Resp: 20    Complications: No apparent anesthesia complications

## 2014-07-28 NOTE — H&P (Signed)
Rebecca Sandoval 07/10/2014 11:44 AM Location: [facility] Patient #: [id number] DOB: [birth year] Married / Language: English / Race: White Female  History of Present Illness [name] MD; 07/10/2014 12:16 PM) The patient is a 79 year old female who presents with a complaint of lcis. LCIS  Additional reasons for visit:  Breast problems are described as the following: Patient is a 79 year old female who presented with an abnormal mammogram. She underwent diagnostic mammography and biopsy. Pathology was positive for lobular carcinoma in situ. She is referred for excision. She has had a call back previously, but she has never had a biopsy of her breasts before. She has no family history of breast cancer.  She has previously had colonic polyps. She did have to have surgery for this. Of note, her father probably had some type of cancer. He was described as having multiple lung nodules. However, he was not well enough to undergo surgery for these. If his 17th siblings, 30 had some type of cancer including colon, brain, and others. She has siblings and does not have any siblings with cancer.  She is on Coumadin for atrial fibrillation. She rarely in atrial fibrillation. Now that she is on Flecainide, Her heart rate is much better controlled.  Other Problems [name], CMA; 07/10/2014 11:44 AM) Atrial Fibrillation Diverticulosis Gastroesophageal Reflux Disease Inguinal Hernia Lump In Breast Ulcerative Colitis  Past Surgical History [name], CMA; 07/10/2014 11:44 AM) Appendectomy Breast Biopsy Left. Colon Polyp Removal - Colonoscopy Colon Removal - Partial Hysterectomy (due to cancer) - Complete Tonsillectomy  Diagnostic Studies History [name], CMA; 07/10/2014 11:44 AM) Colonoscopy 1-5 years ago Mammogram within last year Pap Smear >5 years ago  Allergies [name], CMA; 07/10/2014 11:51 AM) Tetanus Toxoid Adsorbed  *TOXOIDS* Hives. PredniSONE (Pak) *CORTICOSTEROIDS* Propofol *CHEMICALS*  Medication History [name], CMA; 07/10/2014 11:49 AM) Tambocor (50MG  Tablet, 1 Oral daily) Active. Flonase (50MCG/ACT Suspension, Nasal prn) Active. Ativan (0.5MG  Tablet, Oral prn) Active. Lopressor (50MG  Tablet, 1 Oral daily) Active. Paxil (20MG  Tablet, 1 Oral daily) Active. Maxzide-25 (37.5-25MG  Tablet, 1 Oral daily) Active. Coumadin (5MG  Tablet, 1 Oral daily) Active.  Social History [name], [place]; 07/10/2014 11:44 AM) Alcohol use Remotely quit alcohol use. Caffeine use Carbonated beverages, Coffee, Tea. No drug use Tobacco use Former smoker.  Family History [name], [place]; 07/10/2014 11:44 AM) Depression Brother, Mother. Diabetes Mellitus Father. Hypertension Father. Ischemic Bowel Disease Daughter. Respiratory Condition Father.  Pregnancy / Birth History [name], [place]; 07/10/2014 11:44 AM) Age at menarche 31 years. Age of menopause >62 Contraceptive History Oral contraceptives. Gravida 3 Maternal age 39-20 Para 2  Review of Systems ([place]; 07/10/2014 11:44 AM) General Not Present- Appetite Loss, Chills, Fatigue, Fever, Night Sweats, Weight Gain and Weight Loss. Skin Not Present- Change in Wart/Mole, Dryness, Hives, Jaundice, New Lesions, Non-Healing Wounds, Rash and Ulcer. HEENT Present- Wears glasses/contact lenses. Not Present- Earache, Hearing Loss, Hoarseness, Nose Bleed, Oral Ulcers, Ringing in the Ears, Seasonal Allergies, Sinus Pain, Sore Throat, Visual Disturbances and Yellow Eyes. Respiratory Not Present- Bloody sputum, Chronic Cough, Difficulty Breathing, Snoring and Wheezing. Breast Present- Breast Mass. Not Present- Breast Pain, Nipple Discharge and Skin Changes. Female Genitourinary Not Present- Frequency, Nocturia, Painful Urination, Pelvic Pain and Urgency. Musculoskeletal Not Present- Back Pain, Joint Pain, Joint Stiffness,  Muscle Pain, Muscle Weakness and Swelling of Extremities. Neurological Not Present- Decreased Memory, Fainting, Headaches, Numbness, Seizures, Tingling, Tremor, Trouble walking and Weakness. Psychiatric Not Present- Anxiety, Bipolar, Change in Sleep Pattern, Depression, Fearful  and Frequent crying. Endocrine Not Present- Cold Intolerance, Excessive Hunger, Hair Changes, Heat Intolerance, Hot flashes and New Diabetes. Hematology Not Present- Easy Bruising, Excessive bleeding, Gland problems, HIV and Persistent Infections.   Vitals Jearld Fenton Morris CMA; 07/10/2014 11:52 AM) 07/10/2014 11:51 AM Weight: 195.2 lb Height: 66in Body Surface Area: 2.03 m Body Mass Index: 31.51 kg/m Temp.: 98.43F(Oral)  Pulse: 48 (Regular)  Resp.: 16 (Unlabored)  BP: 118/78 (Sitting, Left Arm, Standard)    Physical Exam [name] MD; 07/10/2014 12:17 PM) General Mental Status-Alert. General Appearance-Consistent with stated age. Hydration-Well hydrated. Voice-Normal.  Head and Neck Head-normocephalic, atraumatic with no lesions or palpable masses. Trachea-midline. Thyroid Gland Characteristics - normal size and consistency.  Eye Eyeball - Bilateral-Extraocular movements intact. Sclera/Conjunctiva - Bilateral-No scleral icterus.  Chest and Lung Exam Chest and lung exam reveals -quiet, even and easy respiratory effort with no use of accessory muscles and on auscultation, normal breath sounds, no adventitious sounds and normal vocal resonance. Inspection Chest Wall - Normal. Back - normal.  Breast Note: Breasts are symmetric bilaterally. No palpable masses are present. minimal bruising. no nipple retraction or skin dimpling.   Cardiovascular Cardiovascular examination reveals -normal heart sounds, regular rate and rhythm with no murmurs and normal pedal pulses bilaterally.  Abdomen Inspection Inspection of the abdomen reveals - No  Hernias. Palpation/Percussion Palpation and Percussion of the abdomen reveal - Soft, Non Tender, No Rebound tenderness, No Rigidity (guarding) and No hepatosplenomegaly. Auscultation Auscultation of the abdomen reveals - Bowel sounds normal.  Neurologic Neurologic evaluation reveals -alert and oriented x 3 with no impairment of recent or remote memory. Mental Status-Normal.  Musculoskeletal Global Assessment -Note: no gross deformities.  Normal Exam - Left-Upper Extremity Strength Normal and Lower Extremity Strength Normal. Normal Exam - Right-Upper Extremity Strength Normal and Lower Extremity Strength Normal.  Lymphatic Head & Neck  General Head & Neck Lymphatics: Bilateral - Description - Normal. Axillary  General Axillary Region: Bilateral - Description - Normal. Tenderness - Non Tender. Femoral & Inguinal  Generalized Femoral & Inguinal Lymphatics: Bilateral - Description - No Generalized lymphadenopathy.    Assessment & Plan [name] MD; 07/10/2014 12:20 PM) BREAST NEOPLASM, TIS (LCIS), LEFT (233.0  D05.02) Impression: The patient will need an excision to make sure the LCIS is not associated with an invasive cancer. I discussed the LCIS is a risk factor for cancer. She also may need referral to oncology for consideration of tamoxifen therapy.  The surgical procedure was described to the patient. I discussed the incision type and location and that we would need radiology involved on with a wire or seed marker and/or sentinel node.  The risks and benefits of the procedure were described to the patient and she wishes to proceed.  I would send her to genetics, but she has made it to 80 without other cancers and has no first degree relatives wtih cancer, so it may have minimal benefit.  We discussed the risks bleeding, infection, damage to other structures, need for further procedures/surgeries. We discussed the risk of seroma. The patient was advised if the  area in the breast in cancer, we may need to go back to surgery for additional tissue to obtain negative margins or for a lymph node biopsy. The patient was advised that these are the most common complications, but that others can occur as well. They were advised against taking aspirin or other anti-inflammatory agents/blood thinners the week before surgery. Current Plans  Pt Education - [facility] Breast Biopsy Instructions (FLB): discussed  with patient and provided information. Schedule for Surgery Pt Education - [facility] Breast Biopsy HCI Schedule for Surgery   Signed by [name], MD (07/10/2014 12:21 PM)

## 2014-07-28 NOTE — Anesthesia Procedure Notes (Signed)
Procedure Name: LMA Insertion Date/Time: 07/28/2014 8:59 AM Performed by: Neldon Newport Pre-anesthesia Checklist: Patient being monitored, Suction available, Emergency Drugs available, Timeout performed and Patient identified Patient Re-evaluated:Patient Re-evaluated prior to inductionOxygen Delivery Method: Circle system utilized Preoxygenation: Pre-oxygenation with 100% oxygen Intubation Type: IV induction Ventilation: Mask ventilation without difficulty LMA: LMA inserted LMA Size: 4.0 Tube type: Oral Number of attempts: 1

## 2014-07-28 NOTE — Discharge Instructions (Signed)
[facility] °Office Phone Number [phone] ° °BREAST BIOPSY/ PARTIAL MASTECTOMY: POST OP INSTRUCTIONS ° °Always review your discharge instruction sheet given to you by the facility where your surgery was performed. ° °IF YOU HAVE DISABILITY OR FAMILY LEAVE FORMS, YOU MUST BRING THEM TO THE OFFICE FOR PROCESSING.  DO NOT GIVE THEM TO YOUR DOCTOR. ° °1. A prescription for pain medication may be given to you upon discharge.  Take your pain medication as prescribed, if needed.  If narcotic pain medicine is not needed, then you may take acetaminophen (Tylenol) or ibuprofen (Advil) as needed. °2. Take your usually prescribed medications unless otherwise directed °3. If you need a refill on your pain medication, please contact your pharmacy.  They will contact our office to request authorization.  Prescriptions will not be filled after 5pm or on week-ends. °4. You should eat very light the first 24 hours after surgery, such as soup, crackers, pudding, etc.  Resume your normal diet the day after surgery. °5. Most patients will experience some swelling and bruising in the breast.  Ice packs and a good support bra will help.  Swelling and bruising can take several days to resolve.  °6. It is common to experience some constipation if taking pain medication after surgery.  Increasing fluid intake and taking a stool softener will usually help or prevent this problem from occurring.  A mild laxative (Milk of Magnesia or Miralax) should be taken according to package directions if there are no bowel movements after 48 hours. °7. Unless discharge instructions indicate otherwise, you may remove your bandages 48 hours after surgery, and you may shower at that time.  You may have steri-strips (small skin tapes) in place directly over the incision.  These strips should be left on the skin for 7-10 days.   Any sutures or staples will be removed at the office during your follow-up visit. °8. ACTIVITIES:  You may resume  regular daily activities (gradually increasing) beginning the next day.  Wearing a good support bra or sports bra (or the breast binder) minimizes pain and swelling.  You may have sexual intercourse when it is comfortable. °a. You may drive when you no longer are taking prescription pain medication, you can comfortably wear a seatbelt, and you can safely maneuver your car and apply brakes. °b. RETURN TO WORK:  __________1 week_______________ °9. You should see your doctor in the office for a follow-up appointment approximately two weeks after your surgery.  Your doctor’s nurse will typically make your follow-up appointment when she calls you with your pathology report.  Expect your pathology report 2-3 business days after your surgery.  You may call to check if you do not hear from us after three days. ° ° °WHEN TO CALL YOUR DOCTOR: °1. Fever over 101.0 °2. Nausea and/or vomiting. °3. Extreme swelling or bruising. °4. Continued bleeding from incision. °5. Increased pain, redness, or drainage from the incision. ° °The clinic staff is available to answer your questions during regular business hours.  Please don’t hesitate to call and ask to speak to one of the nurses for clinical concerns.  If you have a medical emergency, go to the nearest emergency room or call 911.  A surgeon from [facility] is always on call at the hospital. ° °For further questions, please visit [email or web address]  ° °

## 2014-07-28 NOTE — Anesthesia Postprocedure Evaluation (Signed)
Anesthesia Post Note  Patient: Rebecca Sandoval  Procedure(s) Performed: Procedure(s) (LRB): LEFT BREAST LUMPECTOMY WITH RADIOACTIVE SEED LOCALIZATION (Left)  Anesthesia type: general  Patient location: PACU  Post pain: Pain level controlled  Post assessment: Patient's Cardiovascular Status Stable  Last Vitals:  Filed Vitals:   07/28/14 1000  BP:   Pulse:   Temp: 36.6 C  Resp:     Post vital signs: Reviewed and stable  Level of consciousness: sedated  Complications: No apparent anesthesia complications

## 2014-07-28 NOTE — Op Note (Signed)
Left Breast Radioactive seed localized lumpectomy  Indications: This patient presents with history of LCIS on core needle biopsy  Pre-operative Diagnosis: LCIS  Post-operative Diagnosis: LCIS  Surgeon: BYERLY,FAERA   Anesthesia: General endotracheal anesthesia  ASA Class: 3  Procedure Details  The patient was seen in the Holding Room. The risks, benefits, complications, treatment options, and expected outcomes were discussed with the patient. The possibilities of bleeding, infection, the need for additional procedures, failure to diagnose a condition, and creating a complication requiring transfusion or operation were discussed with the patient. The patient concurred with the proposed plan, giving informed consent.  The site of surgery properly noted/marked. The patient was taken to Operating Room # 1, identified, and the procedure verified as left Breast Lumpectomy. A Time Out was held and the above information confirmed.  The left breast, and chest were prepped and draped in standard fashion. The lumpectomy was performed by creating an transverse incision over the upper inner quadrant of the breast over the previously placed radioactive seed.  Dissection was carried down to around the point of maximum signal intensity. The cautery was used to perform the dissection.  Hemostasis was achieved with cautery.  The specimen was inked with the margin marker paint kit.    Specimen radiography confirmed inclusion of the mammographic lesion, the clip, and the seed.  The background signal in the breast was zero.  The wound was irrigated and closed with 3-0 vicryl in layers and 4-0 monocryl subcuticular suture.      Sterile dressings were applied. At the end of the operation, all sponge, instrument, and needle counts were correct.  Findings: grossly clear surgical margins and no adenopathy  Estimated Blood Loss:  min         Specimens: left breast lumpectomy.           Complications:  None; patient  tolerated the procedure well.         Disposition: PACU - hemodynamically stable.         Condition: stable   

## 2014-07-28 NOTE — Anesthesia Preprocedure Evaluation (Addendum)
Anesthesia Evaluation  Patient identified by MRN, date of birth, ID band Patient awake    Reviewed: Allergy & Precautions, NPO status , Patient's Chart, lab work & pertinent test results  Airway Mallampati: I  TM Distance: >3 FB Neck ROM: Full    Dental  (+) Edentulous Upper, Edentulous Lower, Dental Advisory Given   Pulmonary former smoker,    Pulmonary exam normal       Cardiovascular negative cardio ROS Normal cardiovascular exam+ dysrhythmias Atrial Fibrillation     Neuro/Psych PSYCHIATRIC DISORDERS Anxiety negative neurological ROS     GI/Hepatic Neg liver ROS, GERD-  ,  Endo/Other  negative endocrine ROS  Renal/GU negative Renal ROS     Musculoskeletal   Abdominal   Peds  Hematology   Anesthesia Other Findings   Reproductive/Obstetrics                            Anesthesia Physical Anesthesia Plan  ASA: III  Anesthesia Plan: General   Post-op Pain Management:    Induction: Intravenous  Airway Management Planned: LMA  Additional Equipment:   Intra-op Plan:   Post-operative Plan: Extubation in OR  Informed Consent: I have reviewed the patients History and Physical, chart, labs and discussed the procedure including the risks, benefits and alternatives for the proposed anesthesia with the patient or authorized representative who has indicated his/her understanding and acceptance.   Dental advisory given  Plan Discussed with: CRNA, Anesthesiologist and Surgeon  Anesthesia Plan Comments:         Anesthesia Quick Evaluation

## 2014-07-31 ENCOUNTER — Encounter (HOSPITAL_COMMUNITY): Payer: Self-pay | Admitting: General Surgery

## 2014-07-31 NOTE — Progress Notes (Signed)
Quick Note:  Please let patient know pathology is benign. ______

## 2014-08-08 ENCOUNTER — Ambulatory Visit (INDEPENDENT_AMBULATORY_CARE_PROVIDER_SITE_OTHER): Payer: Medicare Other | Admitting: *Deleted

## 2014-08-08 DIAGNOSIS — I4891 Unspecified atrial fibrillation: Secondary | ICD-10-CM

## 2014-08-08 DIAGNOSIS — Z5181 Encounter for therapeutic drug level monitoring: Secondary | ICD-10-CM

## 2014-08-08 LAB — POCT INR: INR: 1.6

## 2014-08-14 ENCOUNTER — Other Ambulatory Visit: Payer: Self-pay | Admitting: Interventional Cardiology

## 2014-08-14 ENCOUNTER — Other Ambulatory Visit: Payer: Self-pay | Admitting: Internal Medicine

## 2014-08-22 ENCOUNTER — Ambulatory Visit (INDEPENDENT_AMBULATORY_CARE_PROVIDER_SITE_OTHER): Payer: Medicare Other | Admitting: *Deleted

## 2014-08-22 DIAGNOSIS — Z5181 Encounter for therapeutic drug level monitoring: Secondary | ICD-10-CM

## 2014-08-22 DIAGNOSIS — I4891 Unspecified atrial fibrillation: Secondary | ICD-10-CM

## 2014-08-22 LAB — POCT INR: INR: 2.2

## 2014-09-29 ENCOUNTER — Ambulatory Visit (INDEPENDENT_AMBULATORY_CARE_PROVIDER_SITE_OTHER): Payer: Medicare Other | Admitting: Pharmacist

## 2014-09-29 DIAGNOSIS — Z5181 Encounter for therapeutic drug level monitoring: Secondary | ICD-10-CM

## 2014-09-29 DIAGNOSIS — I4891 Unspecified atrial fibrillation: Secondary | ICD-10-CM | POA: Diagnosis not present

## 2014-09-29 LAB — POCT INR: INR: 2.8

## 2014-10-27 ENCOUNTER — Ambulatory Visit (INDEPENDENT_AMBULATORY_CARE_PROVIDER_SITE_OTHER): Payer: Medicare Other | Admitting: Pharmacist

## 2014-10-27 DIAGNOSIS — I4891 Unspecified atrial fibrillation: Secondary | ICD-10-CM | POA: Diagnosis not present

## 2014-10-27 DIAGNOSIS — Z5181 Encounter for therapeutic drug level monitoring: Secondary | ICD-10-CM | POA: Diagnosis not present

## 2014-10-27 LAB — POCT INR: INR: 2.2

## 2014-12-08 ENCOUNTER — Ambulatory Visit (INDEPENDENT_AMBULATORY_CARE_PROVIDER_SITE_OTHER): Payer: Medicare Other | Admitting: *Deleted

## 2014-12-08 DIAGNOSIS — Z5181 Encounter for therapeutic drug level monitoring: Secondary | ICD-10-CM

## 2014-12-08 DIAGNOSIS — I4891 Unspecified atrial fibrillation: Secondary | ICD-10-CM | POA: Diagnosis not present

## 2014-12-08 LAB — POCT INR: INR: 2.5

## 2015-01-22 ENCOUNTER — Ambulatory Visit (INDEPENDENT_AMBULATORY_CARE_PROVIDER_SITE_OTHER): Payer: Medicare Other | Admitting: *Deleted

## 2015-01-22 DIAGNOSIS — Z5181 Encounter for therapeutic drug level monitoring: Secondary | ICD-10-CM

## 2015-01-22 DIAGNOSIS — I4891 Unspecified atrial fibrillation: Secondary | ICD-10-CM | POA: Diagnosis not present

## 2015-01-22 LAB — POCT INR: INR: 2.7

## 2015-03-01 ENCOUNTER — Other Ambulatory Visit: Payer: Self-pay | Admitting: Interventional Cardiology

## 2015-03-05 ENCOUNTER — Ambulatory Visit (INDEPENDENT_AMBULATORY_CARE_PROVIDER_SITE_OTHER): Payer: Medicare Other | Admitting: Pharmacist

## 2015-03-05 DIAGNOSIS — I4891 Unspecified atrial fibrillation: Secondary | ICD-10-CM

## 2015-03-05 DIAGNOSIS — Z5181 Encounter for therapeutic drug level monitoring: Secondary | ICD-10-CM

## 2015-03-05 LAB — POCT INR: INR: 1.9

## 2015-03-25 ENCOUNTER — Other Ambulatory Visit: Payer: Self-pay | Admitting: Interventional Cardiology

## 2015-04-05 ENCOUNTER — Ambulatory Visit (INDEPENDENT_AMBULATORY_CARE_PROVIDER_SITE_OTHER): Payer: Medicare Other | Admitting: *Deleted

## 2015-04-05 DIAGNOSIS — I4891 Unspecified atrial fibrillation: Secondary | ICD-10-CM | POA: Diagnosis not present

## 2015-04-05 DIAGNOSIS — I480 Paroxysmal atrial fibrillation: Secondary | ICD-10-CM | POA: Diagnosis not present

## 2015-04-05 DIAGNOSIS — Z5181 Encounter for therapeutic drug level monitoring: Secondary | ICD-10-CM | POA: Diagnosis not present

## 2015-04-05 LAB — POCT INR: INR: 3

## 2015-05-03 ENCOUNTER — Ambulatory Visit (INDEPENDENT_AMBULATORY_CARE_PROVIDER_SITE_OTHER): Payer: Medicare Other

## 2015-05-03 DIAGNOSIS — I480 Paroxysmal atrial fibrillation: Secondary | ICD-10-CM | POA: Diagnosis not present

## 2015-05-03 DIAGNOSIS — Z5181 Encounter for therapeutic drug level monitoring: Secondary | ICD-10-CM | POA: Diagnosis not present

## 2015-05-03 DIAGNOSIS — I4891 Unspecified atrial fibrillation: Secondary | ICD-10-CM | POA: Diagnosis not present

## 2015-05-03 LAB — POCT INR: INR: 3

## 2015-06-25 ENCOUNTER — Other Ambulatory Visit: Payer: Self-pay | Admitting: Interventional Cardiology

## 2015-07-16 ENCOUNTER — Other Ambulatory Visit: Payer: Self-pay | Admitting: Interventional Cardiology

## 2015-07-27 ENCOUNTER — Ambulatory Visit (INDEPENDENT_AMBULATORY_CARE_PROVIDER_SITE_OTHER): Payer: Medicare Other

## 2015-07-27 DIAGNOSIS — I480 Paroxysmal atrial fibrillation: Secondary | ICD-10-CM

## 2015-07-27 DIAGNOSIS — I4891 Unspecified atrial fibrillation: Secondary | ICD-10-CM

## 2015-07-27 DIAGNOSIS — Z5181 Encounter for therapeutic drug level monitoring: Secondary | ICD-10-CM | POA: Diagnosis not present

## 2015-07-27 LAB — POCT INR: INR: 2.5

## 2015-09-15 ENCOUNTER — Other Ambulatory Visit: Payer: Self-pay | Admitting: Interventional Cardiology

## 2015-10-08 ENCOUNTER — Other Ambulatory Visit: Payer: Self-pay | Admitting: Interventional Cardiology

## 2015-11-26 ENCOUNTER — Other Ambulatory Visit: Payer: Self-pay | Admitting: Interventional Cardiology

## 2015-12-12 ENCOUNTER — Ambulatory Visit (INDEPENDENT_AMBULATORY_CARE_PROVIDER_SITE_OTHER): Payer: Medicare Other | Admitting: Pharmacist

## 2015-12-12 DIAGNOSIS — I4891 Unspecified atrial fibrillation: Secondary | ICD-10-CM

## 2015-12-12 DIAGNOSIS — Z5181 Encounter for therapeutic drug level monitoring: Secondary | ICD-10-CM | POA: Diagnosis not present

## 2015-12-12 DIAGNOSIS — I480 Paroxysmal atrial fibrillation: Secondary | ICD-10-CM | POA: Diagnosis not present

## 2015-12-12 LAB — POCT INR: INR: 1.9

## 2015-12-12 MED ORDER — WARFARIN SODIUM 5 MG PO TABS: ORAL_TABLET | ORAL | 0 refills | Status: DC

## 2015-12-20 ENCOUNTER — Ambulatory Visit (INDEPENDENT_AMBULATORY_CARE_PROVIDER_SITE_OTHER): Payer: Medicare Other | Admitting: Interventional Cardiology

## 2015-12-20 ENCOUNTER — Encounter: Payer: Self-pay | Admitting: Interventional Cardiology

## 2015-12-20 VITALS — BP 140/66 | HR 55 | Ht 66.0 in | Wt 199.0 lb

## 2015-12-20 DIAGNOSIS — Z5181 Encounter for therapeutic drug level monitoring: Secondary | ICD-10-CM

## 2015-12-20 DIAGNOSIS — I495 Sick sinus syndrome: Secondary | ICD-10-CM | POA: Diagnosis not present

## 2015-12-20 DIAGNOSIS — I480 Paroxysmal atrial fibrillation: Secondary | ICD-10-CM | POA: Diagnosis not present

## 2015-12-20 DIAGNOSIS — Z7901 Long term (current) use of anticoagulants: Secondary | ICD-10-CM | POA: Diagnosis not present

## 2015-12-20 NOTE — Progress Notes (Signed)
Cardiology Office Note    Date:  12/20/2015   ID:  Rebecca Sandoval, DOB [birth year], MRN [id number]  PCP:  Vickii Penna., MD  Cardiologist: Sinclair Grooms, MD   Chief Complaint  Patient presents with  . Follow-up    Atrial fibrillation    History of Present Illness:  Rebecca Sandoval is a 80 y.o. female who presents for tachycardia-bradycardia syndrome with a history of paroxysmal atrial fibrillation.  No episodes of atrial fibrillation since Tambocor was started. No side effects to the medication. She denies any episodes of palpitations or syncope. There is no peripheral edema. She denies dyspnea.    Past Medical History:  Diagnosis Date  . Anxiety   . Complication of anesthesia    Difficult to wake up.  . Diverticulitis   . GERD (gastroesophageal reflux disease)   . Hemorrhoid   . Obesity   . Paroxysmal atrial fibrillation (HCC)   . Tachycardia-bradycardia [facility])     Past Surgical History:  Procedure Laterality Date  . ABDOMINAL HYSTERECTOMY  1975  . APPENDECTOMY    . bartholyn cyst  1973  . BILATERAL TEMPOROMANDIBULAR JOINT ARTHROPLASTY  1977  . BREAST LUMPECTOMY WITH RADIOACTIVE SEED LOCALIZATION Left 07/28/2014   Procedure: LEFT BREAST LUMPECTOMY WITH RADIOACTIVE SEED LOCALIZATION;  Surgeon: [name], MD;  Location: [facility];  Service: General;  Laterality: Left;  . diverticulitis  1986  . HERNIA REPAIR  1996   hiatal  . SHOULDER SURGERY     left 97, rigth 98  . TONSILLECTOMY      Current Medications: Outpatient Medications Prior to Visit  Medication Sig Dispense Refill  . acetaminophen (TYLENOL) 325 MG tablet Take 650 mg by mouth every 6 (six) hours as needed (pain).    Marland Kitchen allopurinol (ZYLOPRIM) 300 MG tablet Take 300 mg by mouth daily.    . flecainide (TAMBOCOR) 50 MG tablet Take 1 tablet by mouth  twice a day 180 tablet 0  . fluticasone (FLONASE) 50 MCG/ACT nasal spray Place 1 spray into the nose as needed for allergies.     Marland Kitchen LORazepam  (ATIVAN) 0.5 MG tablet Take 0.5 mg by mouth every 8 (eight) hours as needed for anxiety or sleep.     . metoprolol (LOPRESSOR) 50 MG tablet Take one-half tablet by  mouth two times a day 90 tablet 0  . oxyCODONE-acetaminophen (ROXICET) 5-325 MG per tablet Take 1-2 tablets by mouth every 4 (four) hours as needed for severe pain. 30 tablet 0  . PARoxetine (PAXIL) 20 MG tablet Take 1 tablet (20 mg total) by mouth every morning. 90 tablet 2  . triamterene-hydrochlorothiazide (MAXZIDE-25) 37.5-25 MG tablet Take 1 tablet by mouth  daily 90 tablet 0  . warfarin (COUMADIN) 5 MG tablet Take as directed by Coumadin clinic. 45 tablet 0   No facility-administered medications prior to visit.      Allergies:   Tetanus toxoids and Prednisone   Social History   Social History  . Marital status: Married    Spouse name: N/A  . Number of children: N/A  . Years of education: N/A   Social History Main Topics  . Smoking status: Former Research scientist (life sciences)  . Smokeless tobacco: Never Used  . Alcohol use No  . Drug use: No  . Sexual activity: Not Asked   Other Topics Concern  . None   Social History Narrative   Pt lives in [place] with spouse   Works as a Community education officer at [employer].  Family History:  The patient's family history includes Cancer in her paternal aunt and paternal uncle; Stroke (age of onset: 81) in her mother.   ROS:   Please see the history of present illness.    None  All other systems reviewed and are negative.   PHYSICAL EXAM:   VS:  BP 140/66   Pulse (!) 55   Ht $R'5\' 6"'er$  (1.676 m)   Wt 199 lb (90.3 kg)   BMI 32.12 kg/m    GEN: Well nourished, well developed, in no acute distress  HEENT: normal  Neck: no JVD, carotid bruits, or masses Cardiac: RRR; no murmurs, rubs, or gallops,no edema  Respiratory:  clear to auscultation bilaterally, normal work of breathing GI: soft, nontender, nondistended, + BS MS: no deformity or atrophy  Skin: warm and dry, no rash Neuro:  Alert and  Oriented x 3, Strength and sensation are intact Psych: euthymic mood, full affect  Wt Readings from Last 3 Encounters:  12/20/15 199 lb (90.3 kg)  07/28/14 194 lb (88 kg)  07/24/14 194 lb 11.2 oz (88.3 kg)      Studies/Labs Reviewed:   EKG:  EKG  Sinus bradycardia, 55 bpm. Otherwise normal.  Recent Labs: No results found for requested labs within last 8760 hours.   Lipid Panel No results found for: CHOL, TRIG, HDL, CHOLHDL, VLDL, LDLCALC, LDLDIRECT  Additional studies/ records that were reviewed today include:  No new data    ASSESSMENT:    1. Tachycardia-bradycardia syndrome ([place])   2. Paroxysmal atrial fibrillation ([place])   3. Chronic anticoagulation   4. Encounter for therapeutic drug monitoring      PLAN:  In order of problems listed above:  1. Asymptomatic without syncope or any suggestion of arrhythmia producing a problem. 2. Since starting flecainide, no clinical recurrences of atrial fibrillation that the patient is able to identified. 3. Coumadin anticoagulation without side effects. Chads score is > 2. 4. Flecainide therapy without complications. EKG does not reveal QRS prolongation or QT prolongation. Plan clinical follow-up in one year or earlier if complaints.    Medication Adjustments/Labs and Tests Ordered: Current medicines are reviewed at length with the patient today.  Concerns regarding medicines are outlined above.  Medication changes, Labs and Tests ordered today are listed in the Patient Instructions below. There are no Patient Instructions on file for this visit.   Signed, Sinclair Grooms, MD  12/20/2015 12:17 PM    [facility] [place], [place], [place]  [address] Phone: [phone]; Fax: [phone]

## 2015-12-20 NOTE — Patient Instructions (Signed)

## 2016-01-09 ENCOUNTER — Ambulatory Visit (INDEPENDENT_AMBULATORY_CARE_PROVIDER_SITE_OTHER): Payer: Medicare Other | Admitting: Pharmacist

## 2016-01-09 DIAGNOSIS — I4891 Unspecified atrial fibrillation: Secondary | ICD-10-CM | POA: Diagnosis not present

## 2016-01-09 DIAGNOSIS — Z5181 Encounter for therapeutic drug level monitoring: Secondary | ICD-10-CM | POA: Diagnosis not present

## 2016-01-09 DIAGNOSIS — I480 Paroxysmal atrial fibrillation: Secondary | ICD-10-CM | POA: Diagnosis not present

## 2016-01-09 LAB — POCT INR: INR: 2

## 2016-01-09 MED ORDER — WARFARIN SODIUM 5 MG PO TABS: ORAL_TABLET | ORAL | 0 refills | Status: DC

## 2016-02-05 ENCOUNTER — Encounter (INDEPENDENT_AMBULATORY_CARE_PROVIDER_SITE_OTHER): Payer: Self-pay

## 2016-02-05 ENCOUNTER — Ambulatory Visit (INDEPENDENT_AMBULATORY_CARE_PROVIDER_SITE_OTHER): Payer: Medicare Other | Admitting: *Deleted

## 2016-02-05 DIAGNOSIS — I4891 Unspecified atrial fibrillation: Secondary | ICD-10-CM

## 2016-02-05 DIAGNOSIS — I480 Paroxysmal atrial fibrillation: Secondary | ICD-10-CM

## 2016-02-05 DIAGNOSIS — Z5181 Encounter for therapeutic drug level monitoring: Secondary | ICD-10-CM | POA: Diagnosis not present

## 2016-02-05 LAB — POCT INR: INR: 2.8

## 2016-02-24 ENCOUNTER — Other Ambulatory Visit: Payer: Self-pay | Admitting: Interventional Cardiology

## 2016-03-13 ENCOUNTER — Ambulatory Visit (INDEPENDENT_AMBULATORY_CARE_PROVIDER_SITE_OTHER): Payer: Medicare Other | Admitting: *Deleted

## 2016-03-13 DIAGNOSIS — I4891 Unspecified atrial fibrillation: Secondary | ICD-10-CM

## 2016-03-13 DIAGNOSIS — Z5181 Encounter for therapeutic drug level monitoring: Secondary | ICD-10-CM | POA: Diagnosis not present

## 2016-03-13 DIAGNOSIS — I480 Paroxysmal atrial fibrillation: Secondary | ICD-10-CM | POA: Diagnosis not present

## 2016-03-13 LAB — POCT INR: INR: 2.9

## 2016-04-23 ENCOUNTER — Ambulatory Visit (INDEPENDENT_AMBULATORY_CARE_PROVIDER_SITE_OTHER): Payer: Medicare Other | Admitting: *Deleted

## 2016-04-23 DIAGNOSIS — I480 Paroxysmal atrial fibrillation: Secondary | ICD-10-CM

## 2016-04-23 DIAGNOSIS — I4891 Unspecified atrial fibrillation: Secondary | ICD-10-CM

## 2016-04-23 DIAGNOSIS — Z5181 Encounter for therapeutic drug level monitoring: Secondary | ICD-10-CM | POA: Diagnosis not present

## 2016-04-23 LAB — POCT INR: INR: 3.1

## 2016-06-06 ENCOUNTER — Ambulatory Visit (INDEPENDENT_AMBULATORY_CARE_PROVIDER_SITE_OTHER): Payer: Medicare Other | Admitting: *Deleted

## 2016-06-06 DIAGNOSIS — Z5181 Encounter for therapeutic drug level monitoring: Secondary | ICD-10-CM

## 2016-06-06 DIAGNOSIS — I4891 Unspecified atrial fibrillation: Secondary | ICD-10-CM

## 2016-06-06 DIAGNOSIS — I480 Paroxysmal atrial fibrillation: Secondary | ICD-10-CM | POA: Diagnosis not present

## 2016-06-06 LAB — POCT INR: INR: 2.6

## 2016-07-18 ENCOUNTER — Ambulatory Visit (INDEPENDENT_AMBULATORY_CARE_PROVIDER_SITE_OTHER): Payer: Medicare Other | Admitting: Pharmacist

## 2016-07-18 DIAGNOSIS — I480 Paroxysmal atrial fibrillation: Secondary | ICD-10-CM

## 2016-07-18 DIAGNOSIS — I4891 Unspecified atrial fibrillation: Secondary | ICD-10-CM | POA: Diagnosis not present

## 2016-07-18 DIAGNOSIS — Z5181 Encounter for therapeutic drug level monitoring: Secondary | ICD-10-CM | POA: Diagnosis not present

## 2016-07-18 LAB — POCT INR: INR: 2

## 2016-07-27 NOTE — Progress Notes (Signed)
Cardiology Office Note    Date:  07/28/2016   ID:  Rebecca Sandoval, DOB 14-Dec-1935, MRN [id number]  PCP:  Vickii Penna, MD  Cardiologist: Sinclair Grooms, MD   Chief Complaint  Patient presents with  . Atrial Fibrillation    History of Present Illness:  Rebecca Sandoval is a 81 y.o. female who presents for tachycardia-bradycardia syndrome with a history of paroxysmal atrial fibrillation.  [employer] noted lower extremity swelling and recommended that she come see her cardiologist. She denies shortness of breath, chest pain, prolonged palpitations, orthopnea, and PND. No chest pain. She uses a push mower to cut her grass.  Past Medical History:  Diagnosis Date  . Anxiety   . Complication of anesthesia    Difficult to wake up.  . Diverticulitis   . GERD (gastroesophageal reflux disease)   . Hemorrhoid   . Obesity   . Paroxysmal atrial fibrillation (HCC)   . Tachycardia-bradycardia [facility])     Past Surgical History:  Procedure Laterality Date  . ABDOMINAL HYSTERECTOMY  1975  . APPENDECTOMY    . bartholyn cyst  1973  . BILATERAL TEMPOROMANDIBULAR JOINT ARTHROPLASTY  1977  . BREAST LUMPECTOMY WITH RADIOACTIVE SEED LOCALIZATION Left 07/28/2014   Procedure: LEFT BREAST LUMPECTOMY WITH RADIOACTIVE SEED LOCALIZATION;  Surgeon: [name], MD;  Location: [facility];  Service: General;  Laterality: Left;  . diverticulitis  1986  . HERNIA REPAIR  1996   hiatal  . SHOULDER SURGERY     left 97, rigth 98  . TONSILLECTOMY      Current Medications: Outpatient Medications Prior to Visit  Medication Sig Dispense Refill  . acetaminophen (TYLENOL) 325 MG tablet Take 650 mg by mouth every 6 (six) hours as needed (pain).    Marland Kitchen allopurinol (ZYLOPRIM) 300 MG tablet Take 300 mg by mouth daily.    . flecainide (TAMBOCOR) 50 MG tablet Take 1 tablet (50 mg total) by mouth 2 (two) times daily. 180 tablet 2  . fluticasone (FLONASE) 50 MCG/ACT nasal spray Place 1 spray into  the nose as needed for allergies.     Marland Kitchen LORazepam (ATIVAN) 0.5 MG tablet Take 0.5 mg by mouth every 8 (eight) hours as needed for anxiety or sleep.     . metoprolol (LOPRESSOR) 50 MG tablet Take 0.5 tablets (25 mg total) by mouth 2 (two) times daily. 90 tablet 2  . oxyCODONE-acetaminophen (ROXICET) 5-325 MG per tablet Take 1-2 tablets by mouth every 4 (four) hours as needed for severe pain. 30 tablet 0  . PARoxetine (PAXIL) 20 MG tablet Take 1 tablet (20 mg total) by mouth every morning. 90 tablet 2  . triamterene-hydrochlorothiazide (MAXZIDE-25) 37.5-25 MG tablet Take 1 tablet by mouth daily. 90 tablet 2  . warfarin (COUMADIN) 5 MG tablet Take as directed by Coumadin clinic. 135 tablet 0   No facility-administered medications prior to visit.      Allergies:   Tetanus toxoids and Prednisone   Social History   Social History  . Marital status: Married    Spouse name: N/A  . Number of children: N/A  . Years of education: N/A   Social History Main Topics  . Smoking status: Former Research scientist (life sciences)  . Smokeless tobacco: Never Used  . Alcohol use No  . Drug use: No  . Sexual activity: Not Asked   Other Topics Concern  . None   Social History Narrative   Pt lives in [place] with spouse   Works as  a Community education officer at [employer].     Family History:  The patient's family history includes Cancer in her paternal aunt and paternal uncle; Stroke (age of onset: 77) in her mother.   ROS:   Please see the history of present illness.    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 Legs are more swollen after the classes.  All other systems reviewed and are negative.   PHYSICAL EXAM:   VS:  BP 132/68 (BP Location: Left Arm)   Pulse 61   Ht $R'5\' 6"'Ir$  (1.676 m)   Wt 198 lb 12.8 oz (90.2 kg)   BMI 32.09 kg/m    GEN: Well nourished, well developed, in no acute distress  HEENT: normal    Neck: no JVD, carotid bruits, or masses Cardiac: RRR; no murmurs, rubs, or gallops. Trace to 1+ bilateral ankle edema. No significant pitting is noted. edema . Varicose veins are noted in the posterior aspect of both lower extremities. Respiratory:  clear to auscultation bilaterally, normal work of breathing GI: soft, nontender, nondistended, + BS MS: no deformity or atrophy  Skin: warm and dry, no rash Neuro:  Alert and Oriented x 3, Strength and sensation are intact Psych: euthymic mood, full affect  Wt Readings from Last 3 Encounters:  07/28/16 198 lb 12.8 oz (90.2 kg)  12/20/15 199 lb (90.3 kg)  07/28/14 194 lb (88 kg)      Studies/Labs Reviewed:   EKG:  EKG  not repeated.  Recent Labs: No results found for requested labs within last 8760 hours.   Lipid Panel No results found for: CHOL, TRIG, HDL, CHOLHDL, VLDL, LDLCALC, LDLDIRECT  Additional studies/ records that were reviewed today include:  No new data    ASSESSMENT:    1. Paroxysmal atrial fibrillation (HCC)   2. Lower extremity edema   3. Tachycardia-bradycardia syndrome ([place])   4. Chronic anticoagulation   5. Encounter for therapeutic drug monitoring      PLAN:  In order of problems listed above:  1. Asymptomatic 2. I believe the lower extremity edema is due to dependency from varicose veins. I have recommended intermittent leg elevation and if it becomes more bothersome she will need to purchase and wear knee-high moderate tension support stockings. I will believe this is necessary currently. 3. Treated with pacemaker and flecainide. 4. No bleeding complications.  Clinical follow-up in 9 months.  Medication Adjustments/Labs and Tests Ordered: Current medicines are reviewed at length with the patient today.  Concerns regarding medicines are outlined above.  Medication changes, Labs and Tests ordered today are listed in the Patient Instructions below. Patient Instructions  Medication Instructions:   Your physician recommends that you continue on your current medications as directed. Please refer to the Current Medication list given to you today.   Labwork: None ordered  Testing/Procedures: None ordered  Follow-Up: Your physician wants you to follow-up in: 9-12 months with Dr.Casanova Schurman You will receive a reminder letter in the mail two months in advance. If you don't receive a letter, please call our office to schedule the follow-up appointment.   Any Other Special Instructions Will Be Listed Below (If Applicable).     If you need a refill on your cardiac medications before your next appointment, please call your pharmacy.      Signed, Sinclair Grooms, MD  07/28/2016 3:28 PM    [facility]  [place], [place], [place]  [address] Phone: [phone]; Fax: [phone]

## 2016-07-28 ENCOUNTER — Encounter: Payer: Self-pay | Admitting: Interventional Cardiology

## 2016-07-28 ENCOUNTER — Ambulatory Visit (INDEPENDENT_AMBULATORY_CARE_PROVIDER_SITE_OTHER): Payer: Medicare Other | Admitting: Interventional Cardiology

## 2016-07-28 ENCOUNTER — Encounter (INDEPENDENT_AMBULATORY_CARE_PROVIDER_SITE_OTHER): Payer: Self-pay

## 2016-07-28 VITALS — BP 132/68 | HR 61 | Ht 66.0 in | Wt 198.8 lb

## 2016-07-28 DIAGNOSIS — Z7901 Long term (current) use of anticoagulants: Secondary | ICD-10-CM

## 2016-07-28 DIAGNOSIS — I495 Sick sinus syndrome: Secondary | ICD-10-CM

## 2016-07-28 DIAGNOSIS — R600 Localized edema: Secondary | ICD-10-CM

## 2016-07-28 DIAGNOSIS — I480 Paroxysmal atrial fibrillation: Secondary | ICD-10-CM | POA: Diagnosis not present

## 2016-07-28 DIAGNOSIS — Z5181 Encounter for therapeutic drug level monitoring: Secondary | ICD-10-CM | POA: Diagnosis not present

## 2016-07-28 NOTE — Patient Instructions (Signed)
Medication Instructions:  Your physician recommends that you continue on your current medications as directed. Please refer to the Current Medication list given to you today.   Labwork: None ordered  Testing/Procedures: None ordered  Follow-Up: Your physician wants you to follow-up in: 9-12 months with Dr.Smith You will receive a reminder letter in the mail two months in advance. If you don't receive a letter, please call our office to schedule the follow-up appointment.   Any Other Special Instructions Will Be Listed Below (If Applicable).     If you need a refill on your cardiac medications before your next appointment, please call your pharmacy.

## 2016-08-01 ENCOUNTER — Other Ambulatory Visit: Payer: Self-pay | Admitting: Interventional Cardiology

## 2016-08-01 NOTE — Telephone Encounter (Signed)
New message     *STAT* If patient is at the pharmacy, call can be transferred to refill team.   1. Which medications need to be refilled? (please list name of each medication and dose if known) warfarin 5 mg  2. Which pharmacy/location (including street and city if local pharmacy) is medication to be sent to? [facility]  3. Do they need a 30 day or 90 day supply? [place]

## 2016-08-29 ENCOUNTER — Ambulatory Visit (INDEPENDENT_AMBULATORY_CARE_PROVIDER_SITE_OTHER): Payer: Medicare Other | Admitting: *Deleted

## 2016-08-29 ENCOUNTER — Encounter (INDEPENDENT_AMBULATORY_CARE_PROVIDER_SITE_OTHER): Payer: Self-pay

## 2016-08-29 DIAGNOSIS — I480 Paroxysmal atrial fibrillation: Secondary | ICD-10-CM | POA: Diagnosis not present

## 2016-08-29 DIAGNOSIS — Z5181 Encounter for therapeutic drug level monitoring: Secondary | ICD-10-CM | POA: Diagnosis not present

## 2016-08-29 DIAGNOSIS — I4891 Unspecified atrial fibrillation: Secondary | ICD-10-CM

## 2016-08-29 LAB — POCT INR: INR: 1.8

## 2016-09-29 ENCOUNTER — Ambulatory Visit (INDEPENDENT_AMBULATORY_CARE_PROVIDER_SITE_OTHER): Payer: Medicare Other | Admitting: *Deleted

## 2016-09-29 DIAGNOSIS — Z5181 Encounter for therapeutic drug level monitoring: Secondary | ICD-10-CM | POA: Diagnosis not present

## 2016-09-29 DIAGNOSIS — I480 Paroxysmal atrial fibrillation: Secondary | ICD-10-CM | POA: Diagnosis not present

## 2016-09-29 DIAGNOSIS — I4891 Unspecified atrial fibrillation: Secondary | ICD-10-CM | POA: Diagnosis not present

## 2016-09-29 LAB — POCT INR: INR: 2.9

## 2016-10-27 ENCOUNTER — Ambulatory Visit (INDEPENDENT_AMBULATORY_CARE_PROVIDER_SITE_OTHER): Payer: Medicare Other

## 2016-10-27 DIAGNOSIS — I480 Paroxysmal atrial fibrillation: Secondary | ICD-10-CM

## 2016-10-27 DIAGNOSIS — Z5181 Encounter for therapeutic drug level monitoring: Secondary | ICD-10-CM

## 2016-10-27 DIAGNOSIS — I4891 Unspecified atrial fibrillation: Secondary | ICD-10-CM

## 2016-10-27 LAB — POCT INR: INR: 1.7

## 2016-11-01 ENCOUNTER — Other Ambulatory Visit: Payer: Self-pay | Admitting: Interventional Cardiology

## 2016-11-04 ENCOUNTER — Other Ambulatory Visit: Payer: Self-pay | Admitting: Interventional Cardiology

## 2016-11-18 ENCOUNTER — Ambulatory Visit (INDEPENDENT_AMBULATORY_CARE_PROVIDER_SITE_OTHER): Payer: Medicare Other

## 2016-11-18 DIAGNOSIS — Z5181 Encounter for therapeutic drug level monitoring: Secondary | ICD-10-CM

## 2016-11-18 DIAGNOSIS — I4891 Unspecified atrial fibrillation: Secondary | ICD-10-CM | POA: Diagnosis not present

## 2016-11-18 DIAGNOSIS — I480 Paroxysmal atrial fibrillation: Secondary | ICD-10-CM

## 2016-11-18 LAB — POCT INR: INR: 1.7

## 2016-12-16 ENCOUNTER — Ambulatory Visit (INDEPENDENT_AMBULATORY_CARE_PROVIDER_SITE_OTHER): Payer: Medicare Other | Admitting: *Deleted

## 2016-12-16 DIAGNOSIS — Z5181 Encounter for therapeutic drug level monitoring: Secondary | ICD-10-CM

## 2016-12-16 DIAGNOSIS — I4891 Unspecified atrial fibrillation: Secondary | ICD-10-CM | POA: Diagnosis not present

## 2016-12-16 DIAGNOSIS — I480 Paroxysmal atrial fibrillation: Secondary | ICD-10-CM | POA: Diagnosis not present

## 2016-12-16 LAB — POCT INR: INR: 2.9

## 2017-01-21 ENCOUNTER — Ambulatory Visit (INDEPENDENT_AMBULATORY_CARE_PROVIDER_SITE_OTHER): Payer: Medicare Other

## 2017-01-21 DIAGNOSIS — I480 Paroxysmal atrial fibrillation: Secondary | ICD-10-CM | POA: Diagnosis not present

## 2017-01-21 DIAGNOSIS — Z5181 Encounter for therapeutic drug level monitoring: Secondary | ICD-10-CM | POA: Diagnosis not present

## 2017-01-21 DIAGNOSIS — I4891 Unspecified atrial fibrillation: Secondary | ICD-10-CM

## 2017-01-21 LAB — POCT INR: INR: 2

## 2017-02-13 ENCOUNTER — Ambulatory Visit (INDEPENDENT_AMBULATORY_CARE_PROVIDER_SITE_OTHER): Payer: Medicare Other | Admitting: Pharmacist

## 2017-02-13 DIAGNOSIS — Z5181 Encounter for therapeutic drug level monitoring: Secondary | ICD-10-CM | POA: Diagnosis not present

## 2017-02-13 DIAGNOSIS — I480 Paroxysmal atrial fibrillation: Secondary | ICD-10-CM | POA: Diagnosis not present

## 2017-02-13 DIAGNOSIS — I4891 Unspecified atrial fibrillation: Secondary | ICD-10-CM

## 2017-02-13 LAB — POCT INR: INR: 2.3

## 2017-02-13 NOTE — Patient Instructions (Signed)
Continue taking 1.5 tablets daily except 1 tablet on Mondays. Recheck INR in 6 weeks.

## 2017-03-25 ENCOUNTER — Ambulatory Visit (INDEPENDENT_AMBULATORY_CARE_PROVIDER_SITE_OTHER): Payer: Medicare Other

## 2017-03-25 DIAGNOSIS — Z5181 Encounter for therapeutic drug level monitoring: Secondary | ICD-10-CM | POA: Diagnosis not present

## 2017-03-25 DIAGNOSIS — I480 Paroxysmal atrial fibrillation: Secondary | ICD-10-CM

## 2017-03-25 DIAGNOSIS — I4891 Unspecified atrial fibrillation: Secondary | ICD-10-CM

## 2017-03-25 LAB — POCT INR: INR: 2

## 2017-03-25 NOTE — Patient Instructions (Signed)
Description   Continue taking 1.5 tablets daily except 1 tablet on Mondays. Recheck INR in 6 weeks.

## 2017-04-07 ENCOUNTER — Other Ambulatory Visit: Payer: Self-pay | Admitting: Interventional Cardiology

## 2017-04-21 ENCOUNTER — Other Ambulatory Visit: Payer: Self-pay | Admitting: Interventional Cardiology

## 2017-04-27 DIAGNOSIS — F411 Generalized anxiety disorder: Secondary | ICD-10-CM | POA: Insufficient documentation

## 2017-04-28 DIAGNOSIS — N1832 Chronic kidney disease, stage 3b: Secondary | ICD-10-CM | POA: Insufficient documentation

## 2017-04-28 DIAGNOSIS — N184 Chronic kidney disease, stage 4 (severe): Secondary | ICD-10-CM | POA: Insufficient documentation

## 2017-04-28 DIAGNOSIS — N183 Chronic kidney disease, stage 3 unspecified: Secondary | ICD-10-CM | POA: Insufficient documentation

## 2017-05-06 ENCOUNTER — Encounter: Payer: Self-pay | Admitting: Interventional Cardiology

## 2017-05-13 ENCOUNTER — Ambulatory Visit: Payer: Medicare Other | Admitting: Interventional Cardiology

## 2017-05-26 NOTE — Progress Notes (Signed)
Cardiology Office Note    Date:  05/27/2017   ID:  Rebecca Sandoval, DOB 1935-10-31, MRN [id number]  PCP:  Vickii Penna, MD  Cardiologist: Sinclair Grooms, MD   Chief Complaint  Patient presents with  . Atrial Fibrillation    History of Present Illness:  Rebecca Sandoval is a 82 y.o. female who presents for tachycardia-bradycardia syndrome with a history of paroxysmal atrial fibrillation.  She is doing quite well and has no complaints.  Her husband is diabetic and she has been eating accordingly and has lost greater than 20 pounds since I saw her last.  She denies orthopnea, PND, edema, syncope, and chest pain.    Past Medical History:  Diagnosis Date  . Anxiety   . Complication of anesthesia    Difficult to wake up.  . Diverticulitis   . GERD (gastroesophageal reflux disease)   . Hemorrhoid   . Obesity   . Paroxysmal atrial fibrillation (HCC)   . Tachycardia-bradycardia [facility])     Past Surgical History:  Procedure Laterality Date  . ABDOMINAL HYSTERECTOMY  1975  . APPENDECTOMY    . bartholyn cyst  1973  . BILATERAL TEMPOROMANDIBULAR JOINT ARTHROPLASTY  1977  . BREAST LUMPECTOMY WITH RADIOACTIVE SEED LOCALIZATION Left 07/28/2014   Procedure: LEFT BREAST LUMPECTOMY WITH RADIOACTIVE SEED LOCALIZATION;  Surgeon: [name], MD;  Location: [place];  Service: General;  Laterality: Left;  . diverticulitis  1986  . HERNIA REPAIR  1996   hiatal  . SHOULDER SURGERY     left 97, rigth 98  . TONSILLECTOMY      Current Medications: Outpatient Medications Prior to Visit  Medication Sig Dispense Refill  . acetaminophen (TYLENOL) 325 MG tablet Take 650 mg by mouth every 6 (six) hours as needed (pain).    Marland Kitchen allopurinol (ZYLOPRIM) 300 MG tablet Take 300 mg by mouth daily.    . flecainide (TAMBOCOR) 50 MG tablet TAKE 1 TABLET BY MOUTH TWO  TIMES DAILY 180 tablet 1  . fluticasone (FLONASE) 50 MCG/ACT nasal spray Place 1 spray into the nose as needed for allergies.     Marland Kitchen  LORazepam (ATIVAN) 0.5 MG tablet Take 0.5 mg by mouth every 8 (eight) hours as needed for anxiety or sleep.     . metoprolol tartrate (LOPRESSOR) 50 MG tablet TAKE ONE-HALF TABLET BY  MOUTH TWO TIMES DAILY 90 tablet 1  . ondansetron (ZOFRAN-ODT) 4 MG disintegrating tablet Take 4 mg by mouth every 6 (six) hours as needed for nausea/vomiting.    Marland Kitchen oxyCODONE-acetaminophen (ROXICET) 5-325 MG per tablet Take 1-2 tablets by mouth every 4 (four) hours as needed for severe pain. 30 tablet 0  . PARoxetine (PAXIL) 20 MG tablet Take 1 tablet (20 mg total) by mouth every morning. 90 tablet 2  . triamterene-hydrochlorothiazide (MAXZIDE-25) 37.5-25 MG tablet TAKE 1 TABLET BY MOUTH  DAILY 90 tablet 1  . warfarin (COUMADIN) 5 MG tablet TAKE AS DIRECTED BY  COUMADIN CLINIC 135 tablet 1   No facility-administered medications prior to visit.      Allergies:   Tetanus toxoids and Prednisone   Social History   Socioeconomic History  . Marital status: Married    Spouse name: None  . Number of children: None  . Years of education: None  . Highest education level: None  Social Needs  . Financial resource strain: None  . Food insecurity - worry: None  . Food insecurity - inability: None  . Transportation needs - medical:  None  . Transportation needs - non-medical: None  Occupational History  . None  Tobacco Use  . Smoking status: Former Research scientist (life sciences)  . Smokeless tobacco: Never Used  Substance and Sexual Activity  . Alcohol use: No  . Drug use: No  . Sexual activity: None  Other Topics Concern  . None  Social History Narrative   Pt lives in [place] with spouse   Works as a Community education officer at [employer].     Family History:  The patient's family history includes Cancer in her paternal aunt and paternal uncle; Stroke (age of onset: 68) in her mother.   ROS:   Please see the history of present illness.    Change in dietary intake.  Subsequent loss of 22 pounds by eating the same meals that she prepares for  her husband who is diabetic. All other systems reviewed and are negative.   PHYSICAL EXAM:   VS:  BP 124/68   Pulse (!) 48   Ht $R'5\' 6"'nf$  (1.676 m)   Wt 182 lb 12.8 oz (82.9 kg)   BMI 29.50 kg/m    GEN: Well nourished, well developed, in no acute distress  HEENT: normal  Neck: no JVD, carotid bruits, or masses Cardiac: RRR; no murmurs, rubs, or gallops,no edema  Respiratory:  clear to auscultation bilaterally, normal work of breathing GI: soft, nontender, nondistended, + BS MS: no deformity or atrophy  Skin: warm and dry, no rash Neuro:  Alert and Oriented x 3, Strength and sensation are intact Psych: euthymic mood, full affect  Wt Readings from Last 3 Encounters:  05/27/17 182 lb 12.8 oz (82.9 kg)  07/28/16 198 lb 12.8 oz (90.2 kg)  12/20/15 199 lb (90.3 kg)      Studies/Labs Reviewed:   EKG:  EKG sinus bradycardia at 48 bpm.  Otherwise unremarkable.  QT 470 ms.  Heart rate is slower when compared to prior tracing when heart rate was 56 bpm.  Recent Labs: No results found for requested labs within last 8760 hours.   Lipid Panel No results found for: CHOL, TRIG, HDL, CHOLHDL, VLDL, LDLCALC, LDLDIRECT  Additional studies/ records that were reviewed today include:  bone    ASSESSMENT:    1. Tachycardia-bradycardia syndrome ([place])   2. Paroxysmal atrial fibrillation ([place])   3. Chronic anticoagulation   4. Encounter for therapeutic drug monitoring      PLAN:  In order of problems listed above:  1. Heart rate for the first time is below 50 bpm.  Asymptomatic.  24-hour Holter to determine if metoprolol therapy should be decreased in intensity.  This will certainly be done if pauses or significant asymptomatic bradycardia. 2. Clinical follow-up in 1 year.  Continue flecainide. 3. No bleeding complications on Coumadin. 4. Continue flecainide.  EKG looks fine.  Clinical follow-up in 1 year.  Medication adjustment/beta-blocker adjustment may be required after Holter  monitor is done for 24 hours in the upcoming weeks.    Medication Adjustments/Labs and Tests Ordered: Current medicines are reviewed at length with the patient today.  Concerns regarding medicines are outlined above.  Medication changes, Labs and Tests ordered today are listed in the Patient Instructions below. Patient Instructions  Medication Instructions:  Your physician recommends that you continue on your current medications as directed. Please refer to the Current Medication list given to you today.  Labwork: None  Testing/Procedures: Your physician has recommended that you wear a 24 hour holter monitor. Holter monitors are medical devices that record the heart's  electrical activity. Doctors most often use these monitors to diagnose arrhythmias. Arrhythmias are problems with the speed or rhythm of the heartbeat. The monitor is a small, portable device. You can wear one while you do your normal daily activities. This is usually used to diagnose what is causing palpitations/syncope (passing out).    Follow-Up: Your physician wants you to follow-up in: 1 year with Dr. Tamala Julian.  You will receive a reminder letter in the mail two months in advance. If you don't receive a letter, please call our office to schedule the follow-up appointment.  Any Other Special Instructions Will Be Listed Below (If Applicable).     If you need a refill on your cardiac medications before your next appointment, please call your pharmacy.      Signed, Sinclair Grooms, MD  05/27/2017 12:11 PM    [facility] [place], [place], [place]  [address] Phone: [phone]; Fax: [phone]

## 2017-05-27 ENCOUNTER — Ambulatory Visit: Payer: Medicare Other | Admitting: Interventional Cardiology

## 2017-05-27 ENCOUNTER — Ambulatory Visit (INDEPENDENT_AMBULATORY_CARE_PROVIDER_SITE_OTHER): Payer: Medicare Other | Admitting: Pharmacist

## 2017-05-27 ENCOUNTER — Encounter: Payer: Self-pay | Admitting: Interventional Cardiology

## 2017-05-27 VITALS — BP 124/68 | HR 48 | Ht 66.0 in | Wt 182.8 lb

## 2017-05-27 DIAGNOSIS — Z5181 Encounter for therapeutic drug level monitoring: Secondary | ICD-10-CM

## 2017-05-27 DIAGNOSIS — I480 Paroxysmal atrial fibrillation: Secondary | ICD-10-CM | POA: Diagnosis not present

## 2017-05-27 DIAGNOSIS — I495 Sick sinus syndrome: Secondary | ICD-10-CM | POA: Diagnosis not present

## 2017-05-27 DIAGNOSIS — I4891 Unspecified atrial fibrillation: Secondary | ICD-10-CM | POA: Diagnosis not present

## 2017-05-27 DIAGNOSIS — Z7901 Long term (current) use of anticoagulants: Secondary | ICD-10-CM

## 2017-05-27 LAB — POCT INR: INR: 1.8

## 2017-05-27 NOTE — Patient Instructions (Signed)
Description   Take 2 tablets today then Continue taking 1.5 tablets daily except 1 tablet on Mondays. Recheck INR in 4 weeks.

## 2017-05-27 NOTE — Patient Instructions (Signed)
Medication Instructions:  Your physician recommends that you continue on your current medications as directed. Please refer to the Current Medication list given to you today.  Labwork: None  Testing/Procedures: Your physician has recommended that you wear a 24 hour holter monitor. Holter monitors are medical devices that record the heart's electrical activity. Doctors most often use these monitors to diagnose arrhythmias. Arrhythmias are problems with the speed or rhythm of the heartbeat. The monitor is a small, portable device. You can wear one while you do your normal daily activities. This is usually used to diagnose what is causing palpitations/syncope (passing out).    Follow-Up: Your physician wants you to follow-up in: 1 year with Dr. Tamala Julian.  You will receive a reminder letter in the mail two months in advance. If you don't receive a letter, please call our office to schedule the follow-up appointment.  Any Other Special Instructions Will Be Listed Below (If Applicable).     If you need a refill on your cardiac medications before your next appointment, please call your pharmacy.

## 2017-06-03 ENCOUNTER — Ambulatory Visit: Payer: Medicare Other

## 2017-06-03 ENCOUNTER — Telehealth (INDEPENDENT_AMBULATORY_CARE_PROVIDER_SITE_OTHER): Payer: Medicare Other | Admitting: Interventional Cardiology

## 2017-06-03 DIAGNOSIS — R001 Bradycardia, unspecified: Secondary | ICD-10-CM | POA: Diagnosis not present

## 2017-06-03 MED ORDER — METOPROLOL TARTRATE 25 MG PO TABS: 12.5000 mg | ORAL_TABLET | Freq: Two times a day (BID) | ORAL | 3 refills | Status: DC

## 2017-06-03 NOTE — Telephone Encounter (Signed)
New message    Pt c/o BP issue: STAT if pt c/o blurred vision, one-sided weakness or slurred speech  1. What are your last 5 BP readings? Now 129/70 pulse rate  45   2. Are you having any other symptoms (ex. Dizziness, headache, blurred vision, passed out)?  Dizziness   3. What is your BP issue? Want to discuss

## 2017-06-03 NOTE — Telephone Encounter (Signed)
Pt came in for EKG and was noted to be in sinus brady.  Dr. Tamala Julian decreased Metoprolol Tartrate to 12.$RemoveBefore'5mg'RGgpZSWzjOgPk$  BID and asked that pt come back in 1-2 wks.  Scheduled pt to see Vin Bhagat, PA-C 4/4.

## 2017-06-03 NOTE — Telephone Encounter (Signed)
Pt states she checked her HR manually and it was 44.  Then checked with BP monitor and it was 45, BP 129/70.  Right before I called pt back she checked again and it was 143/74. HR 46.  Pt is to have a monitor placed 3/28.  Pt currently dizzy but denies any other sx.  When checked HR manually she said it felt like she was having skipped beats. Advised pt I will send message to Dr. Tamala Julian for review and advisement.

## 2017-06-03 NOTE — Telephone Encounter (Signed)
Spoke with Dr. Tamala Julian and he said have pt come in to be seen today or tomorrow.  Spoke with pt and scheduled her to come in today and have EKG done.  Pt verbalized understanding and was appreciative for call.

## 2017-06-03 NOTE — Addendum Note (Signed)
Addended by: Loren Racer on: 06/03/2017 04:09 PM   Modules accepted: Orders

## 2017-06-04 ENCOUNTER — Ambulatory Visit: Payer: Medicare Other | Admitting: Physician Assistant

## 2017-06-10 ENCOUNTER — Ambulatory Visit (INDEPENDENT_AMBULATORY_CARE_PROVIDER_SITE_OTHER): Payer: Medicare Other

## 2017-06-10 DIAGNOSIS — I480 Paroxysmal atrial fibrillation: Secondary | ICD-10-CM

## 2017-06-18 ENCOUNTER — Encounter: Payer: Self-pay | Admitting: Physician Assistant

## 2017-06-18 ENCOUNTER — Ambulatory Visit: Payer: Medicare Other | Admitting: Physician Assistant

## 2017-06-18 VITALS — BP 100/40 | HR 60 | Ht 66.0 in | Wt 181.4 lb

## 2017-06-18 DIAGNOSIS — R001 Bradycardia, unspecified: Secondary | ICD-10-CM

## 2017-06-18 DIAGNOSIS — I480 Paroxysmal atrial fibrillation: Secondary | ICD-10-CM | POA: Diagnosis not present

## 2017-06-18 DIAGNOSIS — I10 Essential (primary) hypertension: Secondary | ICD-10-CM

## 2017-06-18 DIAGNOSIS — I495 Sick sinus syndrome: Secondary | ICD-10-CM

## 2017-06-18 NOTE — Patient Instructions (Signed)
Medication Instructions:  Your physician has recommended you make the following change in your medication:  Stop metoprolol tartrate   Labwork: none  Testing/Procedures: none  Follow-Up: Your physician recommends that you schedule a follow-up appointment in: March 2020 with Dr. Tamala Julian    Any Other Special Instructions Will Be Listed Below (If Applicable).     If you need a refill on your cardiac medications before your next appointment, please call your pharmacy.

## 2017-06-18 NOTE — Progress Notes (Signed)
Cardiology Office Note    Date:  06/18/2017   ID:  Rebecca Sandoval, DOB 1936/01/24, MRN [id number]  PCP:  Arlan Organ, MD  Cardiologist: Dr. Katrinka Blazing  EP: [place]  Chief Complaint: Bradycardia  History of Present Illness:   Rebecca Sandoval is a 82 y.o. female with hx of paroxysmal atrial fibrillation with tachybrady syndrome presents for follow up.   Seem by Dr. Katrinka Blazing 05/27/17. Noted asymptomatic bradycardia at 50s. Called in office 06/03/17 for HR of 40s>>>reduced metoprolol to 12.5mg  BID.   24 hours Holter by preliminary review by me showed sinus rhythm with few PACs. HR mostly in 50s with intermittently goes to mid 40s and high to 75s. This was done on metoprolol 12.5mg  BID. Briefly reviewed with Dr. Katrinka Blazing.   The patient denies nausea, vomiting, fever, chest pain, palpitations, shortness of breath, orthopnea, PND, dizziness, syncope, cough, congestion, abdominal pain, hematochezia, melena, lower extremity edema. She continues to do yard work. She push moves lawn and plants garden without limitations. Yesterday she spread 6 cb ft of mulch.    Past Medical History:  Diagnosis Date  . Anxiety   . Complication of anesthesia    Difficult to wake up.  . Diverticulitis   . GERD (gastroesophageal reflux disease)   . Hemorrhoid   . Obesity   . Paroxysmal atrial fibrillation (HCC)   . Tachycardia-bradycardia [facility])     Past Surgical History:  Procedure Laterality Date  . ABDOMINAL HYSTERECTOMY  1975  . APPENDECTOMY    . bartholyn cyst  1973  . BILATERAL TEMPOROMANDIBULAR JOINT ARTHROPLASTY  1977  . BREAST LUMPECTOMY WITH RADIOACTIVE SEED LOCALIZATION Left 07/28/2014   Procedure: LEFT BREAST LUMPECTOMY WITH RADIOACTIVE SEED LOCALIZATION;  Surgeon: Almond Lint, MD;  Location: [facility];  Service: General;  Laterality: Left;  . diverticulitis  1986  . HERNIA REPAIR  1996   hiatal  . SHOULDER SURGERY     left 97, rigth 98  . TONSILLECTOMY      Current Medications: Prior to  Admission medications   Medication Sig Start Date End Date Taking? Authorizing Provider  acetaminophen (TYLENOL) 325 MG tablet Take 650 mg by mouth every 6 (six) hours as needed (pain).    [provider]  allopurinol (ZYLOPRIM) 300 MG tablet Take 300 mg by mouth daily.    [provider]  flecainide (TAMBOCOR) 50 MG tablet TAKE 1 TABLET BY MOUTH TWO  TIMES DAILY 04/07/17   Lyn Records, MD  fluticasone [facility]) 50 MCG/ACT nasal spray Place 1 spray into the nose as needed for allergies.     [provider]  LORazepam (ATIVAN) 0.5 MG tablet Take 0.5 mg by mouth every 8 (eight) hours as needed for anxiety or sleep.  03/17/13   [provider]  metoprolol tartrate (LOPRESSOR) 25 MG tablet Take 0.5 tablets (12.5 mg total) by mouth 2 (two) times daily. 06/03/17 05/29/18  Lyn Records, MD  ondansetron (ZOFRAN-ODT) 4 MG disintegrating tablet Take 4 mg by mouth every 6 (six) hours as needed for nausea/vomiting. 07/18/16   [provider]  oxyCODONE-acetaminophen (ROXICET) 5-325 MG per tablet Take 1-2 tablets by mouth every 4 (four) hours as needed for severe pain. 07/28/14   Almond Lint, MD  PARoxetine (PAXIL) 20 MG tablet Take 1 tablet (20 mg total) by mouth every morning. 07/19/14   Lyn Records, MD  triamterene-hydrochlorothiazide [facility]) 37.5-25 MG tablet TAKE 1 TABLET BY MOUTH  DAILY 04/07/17   Lyn Records, MD  warfarin (COUMADIN) 5 MG tablet TAKE AS DIRECTED BY  COUMADIN CLINIC 04/22/17   Belva Crome, MD    Allergies:   Prednisone and Tetanus toxoids   Social History   Socioeconomic History  . Marital status: Married    Spouse name: Not on file  . Number of children: Not on file  . Years of education: Not on file  . Highest education level: Not on file  Occupational History  . Not on file  Social Needs  . Financial resource strain: Not on file  . Food insecurity:    Worry: Not on file    Inability: Not on file  . Transportation needs:      Medical: Not on file    Non-medical: Not on file  Tobacco Use  . Smoking status: Former Research scientist (life sciences)  . Smokeless tobacco: Never Used  Substance and Sexual Activity  . Alcohol use: No  . Drug use: No  . Sexual activity: Not on file  Lifestyle  . Physical activity:    Days per week: Not on file    Minutes per session: Not on file  . Stress: Not on file  Relationships  . Social connections:    Talks on phone: Not on file    Gets together: Not on file    Attends religious service: Not on file    Active member of club or organization: Not on file    Attends meetings of clubs or organizations: Not on file    Relationship status: Not on file  Other Topics Concern  . Not on file  Social History Narrative   Pt lives in [place] with spouse   Works as a Community education officer at [employer].     Family History:  The patient's family history includes Cancer in her paternal aunt and paternal uncle; Stroke (age of onset: 72) in her mother.  ROS:   Please see the history of present illness.    ROS All other systems reviewed and are negative.   PHYSICAL EXAM:   VS:  BP (!) 100/40   Pulse 60   Ht $R'5\' 6"'ze$  (1.676 m)   Wt 181 lb 6.4 oz (82.3 kg)   SpO2 95%   BMI 29.28 kg/m    GEN: Well nourished, well developed, in no acute distress  HEENT: normal  Neck: no JVD, carotid bruits, or masses Cardiac: RRR; no murmurs, rubs, or gallops,no edema  Respiratory:  clear to auscultation bilaterally, normal work of breathing GI: soft, nontender, nondistended, + BS MS: no deformity or atrophy  Skin: warm and dry, no rash Neuro:  Alert and Oriented x 3, Strength and sensation are intact Psych: euthymic mood, full affect  Wt Readings from Last 3 Encounters:  06/18/17 181 lb 6.4 oz (82.3 kg)  05/27/17 182 lb 12.8 oz (82.9 kg)  07/28/16 198 lb 12.8 oz (90.2 kg)      Studies/Labs Reviewed:   EKG:  EKG is ordered today.  The ekg ordered today demonstrates SR at rate of 60.   Recent Labs: No results  found for requested labs within last 8760 hours.   Lipid Panel No results found for: CHOL, TRIG, HDL, CHOLHDL, VLDL, LDLCALC, LDLDIRECT  Additional studies/ records that were reviewed today include:  As above    ASSESSMENT & PLAN:    1. Bradycardia with hx of PAF Lowest rate of 45 and highest rate of 75 on monitor while on metoprolol 12.$RemoveBeforeDE'5mg'eRtBZLyqeopLPFk$  BID. Will discontinue. Asymptomatic. Continue coumadin for anticoagulation. Continue Flecainide.  2. HTN - BP soft low today. Repeat check  106/60. DC BB as above. Continue Maxzide 25 at current dose. Advised to keep log.     Medication Adjustments/Labs and Tests Ordered: Current medicines are reviewed at length with the patient today.  Concerns regarding medicines are outlined above.  Medication changes, Labs and Tests ordered today are listed in the Patient Instructions below. Patient Instructions  Medication Instructions:  Your physician has recommended you make the following change in your medication:  Stop metoprolol tartrate   Labwork: none  Testing/Procedures: none  Follow-Up: Your physician recommends that you schedule a follow-up appointment in: March 2020 with Dr. Tamala Julian    Any Other Special Instructions Will Be Listed Below (If Applicable).     If you need a refill on your cardiac medications before your next appointment, please call your pharmacy.      Jarrett Soho, [place]  06/18/2017 2:31 PM    [facility] [address], [place], [place]  [address] Phone: [phone]; Fax: [phone]

## 2017-07-07 ENCOUNTER — Telehealth: Payer: Self-pay | Admitting: *Deleted

## 2017-07-07 MED ORDER — METOPROLOL SUCCINATE ER 25 MG PO TB24: 25.0000 mg | ORAL_TABLET | Freq: Every day | ORAL | 3 refills | Status: DC

## 2017-07-07 NOTE — Telephone Encounter (Signed)
Spoke with pt and went over recommendations per Dr. Tamala Julian.  Advised pt to monitor HR and contact the office if she is consistently low and/or symptomatic.  Pt verbalized understanding and was in agreement with this plan.

## 2017-07-07 NOTE — Telephone Encounter (Signed)
-----   Message from Belva Crome, MD sent at 07/06/2017  3:59 PM EDT ----- Yes, resume Metoprolol but use succinate 25 mg daily. ----- Message ----- From: Loren Racer, LPN Sent: 7/68/1157  12:25 PM To: Belva Crome, MD  Pt not currently on Metoprolol at all.  Vin Bhagat, PA-C advised pt to d/c Metoprolol Tartrate 12.5mg  BID on 4/4 due to bradycardia.  Do you want me to have her start the Metoprolol Succinate at 25mg  QD?

## 2017-07-29 ENCOUNTER — Ambulatory Visit (INDEPENDENT_AMBULATORY_CARE_PROVIDER_SITE_OTHER): Payer: Medicare Other | Admitting: *Deleted

## 2017-07-29 DIAGNOSIS — I4891 Unspecified atrial fibrillation: Secondary | ICD-10-CM | POA: Diagnosis not present

## 2017-07-29 DIAGNOSIS — I480 Paroxysmal atrial fibrillation: Secondary | ICD-10-CM | POA: Diagnosis not present

## 2017-07-29 DIAGNOSIS — Z5181 Encounter for therapeutic drug level monitoring: Secondary | ICD-10-CM

## 2017-07-29 LAB — POCT INR: INR: 1.8

## 2017-07-29 NOTE — Patient Instructions (Signed)
Description   Today May 15th take 2 tablets  then change coumadin dose to  1.5 tablets daily . Recheck INR in 2 weeks.

## 2017-08-24 ENCOUNTER — Other Ambulatory Visit: Payer: Self-pay | Admitting: Interventional Cardiology

## 2017-08-25 NOTE — Telephone Encounter (Signed)
Called pt overdue for follow-up, missed last appt secondary to viral illness per pt report, rescheduled appt for tomorrow 08/26/17.  Pt states she did not have to take any abx for illness.  Will refill rx at appt.

## 2017-08-26 ENCOUNTER — Ambulatory Visit (INDEPENDENT_AMBULATORY_CARE_PROVIDER_SITE_OTHER): Payer: Medicare Other | Admitting: *Deleted

## 2017-08-26 DIAGNOSIS — I4891 Unspecified atrial fibrillation: Secondary | ICD-10-CM | POA: Diagnosis not present

## 2017-08-26 DIAGNOSIS — Z5181 Encounter for therapeutic drug level monitoring: Secondary | ICD-10-CM | POA: Diagnosis not present

## 2017-08-26 DIAGNOSIS — I480 Paroxysmal atrial fibrillation: Secondary | ICD-10-CM

## 2017-08-26 LAB — POCT INR: INR: 2.8 (ref 2.0–3.0)

## 2017-08-26 NOTE — Patient Instructions (Signed)
Description   Continue taking 1.5 tablets daily. Recheck INR in 4 weeks.

## 2017-09-11 ENCOUNTER — Other Ambulatory Visit: Payer: Self-pay

## 2017-09-11 MED ORDER — METOPROLOL SUCCINATE ER 25 MG PO TB24: 25.0000 mg | ORAL_TABLET | Freq: Every day | ORAL | 2 refills | Status: DC

## 2017-09-14 ENCOUNTER — Telehealth: Payer: Self-pay | Admitting: Interventional Cardiology

## 2017-09-14 NOTE — Telephone Encounter (Signed)
Outpatient Medication Detail    Disp Refills Start End   metoprolol succinate (TOPROL-XL) 25 MG 24 hr tablet 90 tablet 2 09/11/2017    Sig - Route: Take 1 tablet (25 mg total) by mouth daily. - Oral   Sent to pharmacy as: metoprolol succinate (TOPROL-XL) 25 MG 24 hr tablet   E-Prescribing Status: Receipt confirmed by pharmacy (09/11/2017 3:01 PM EDT)   Pharmacy   [facility] [place], [place]

## 2017-09-14 NOTE — Telephone Encounter (Signed)
New message     *STAT* If patient is at the pharmacy, call can be transferred to refill team.   1. Which medications need to be refilled? (please list name of each medication and dose if known) metoprolol succinate (TOPROL-XL) 25 MG 24 hr tablet  2. Which pharmacy/location (including street and city if local pharmacy) is medication to be sent to [facility] 3. Do they need a 30 day or 90 day supply?90   Phone [phone]

## 2017-09-14 NOTE — Telephone Encounter (Signed)
Spoke with patient and made her aware that this was already sent in as below. She verbalized her understanding and appreciation.

## 2017-09-23 ENCOUNTER — Ambulatory Visit: Payer: Medicare Other | Admitting: Pharmacist

## 2017-09-23 ENCOUNTER — Encounter (INDEPENDENT_AMBULATORY_CARE_PROVIDER_SITE_OTHER): Payer: Self-pay

## 2017-09-23 DIAGNOSIS — I480 Paroxysmal atrial fibrillation: Secondary | ICD-10-CM | POA: Diagnosis not present

## 2017-09-23 DIAGNOSIS — Z5181 Encounter for therapeutic drug level monitoring: Secondary | ICD-10-CM | POA: Diagnosis not present

## 2017-09-23 DIAGNOSIS — I4891 Unspecified atrial fibrillation: Secondary | ICD-10-CM

## 2017-09-23 LAB — POCT INR: INR: 2.4 (ref 2.0–3.0)

## 2017-09-23 NOTE — Patient Instructions (Signed)
Description   Continue taking 1.5 tablets daily. Recheck INR in 4 weeks.

## 2017-10-21 ENCOUNTER — Ambulatory Visit: Payer: Medicare Other

## 2017-10-21 DIAGNOSIS — Z5181 Encounter for therapeutic drug level monitoring: Secondary | ICD-10-CM | POA: Diagnosis not present

## 2017-10-21 DIAGNOSIS — I480 Paroxysmal atrial fibrillation: Secondary | ICD-10-CM | POA: Diagnosis not present

## 2017-10-21 DIAGNOSIS — I4891 Unspecified atrial fibrillation: Secondary | ICD-10-CM

## 2017-10-21 LAB — POCT INR: INR: 2.6 (ref 2.0–3.0)

## 2017-10-21 NOTE — Patient Instructions (Signed)
Description   Continue taking 1.5 tablets daily. Recheck INR in 6 weeks.

## 2017-12-13 ENCOUNTER — Other Ambulatory Visit: Payer: Self-pay | Admitting: Interventional Cardiology

## 2017-12-15 ENCOUNTER — Ambulatory Visit: Payer: Medicare Other

## 2017-12-15 DIAGNOSIS — I4891 Unspecified atrial fibrillation: Secondary | ICD-10-CM | POA: Diagnosis not present

## 2017-12-15 DIAGNOSIS — Z5181 Encounter for therapeutic drug level monitoring: Secondary | ICD-10-CM | POA: Diagnosis not present

## 2017-12-15 DIAGNOSIS — I480 Paroxysmal atrial fibrillation: Secondary | ICD-10-CM | POA: Diagnosis not present

## 2017-12-15 LAB — POCT INR: INR: 2.5 (ref 2.0–3.0)

## 2017-12-15 NOTE — Patient Instructions (Signed)
Continue taking 1.5 tablets daily. Recheck INR in 6 weeks.

## 2018-04-06 ENCOUNTER — Ambulatory Visit: Payer: Medicare Other

## 2018-04-06 DIAGNOSIS — I480 Paroxysmal atrial fibrillation: Secondary | ICD-10-CM

## 2018-04-06 DIAGNOSIS — Z5181 Encounter for therapeutic drug level monitoring: Secondary | ICD-10-CM

## 2018-04-06 DIAGNOSIS — I4891 Unspecified atrial fibrillation: Secondary | ICD-10-CM

## 2018-04-06 LAB — POCT INR: INR: 2.1 (ref 2.0–3.0)

## 2018-04-06 NOTE — Patient Instructions (Signed)
Continue taking 1.5 tablets daily. Recheck INR in 6 weeks.

## 2018-05-01 ENCOUNTER — Other Ambulatory Visit: Payer: Self-pay | Admitting: Interventional Cardiology

## 2018-05-18 ENCOUNTER — Ambulatory Visit: Payer: Medicare Other | Admitting: *Deleted

## 2018-05-18 DIAGNOSIS — Z5181 Encounter for therapeutic drug level monitoring: Secondary | ICD-10-CM

## 2018-05-18 DIAGNOSIS — I480 Paroxysmal atrial fibrillation: Secondary | ICD-10-CM | POA: Diagnosis not present

## 2018-05-18 DIAGNOSIS — I4891 Unspecified atrial fibrillation: Secondary | ICD-10-CM

## 2018-05-18 LAB — POCT INR: INR: 2.1 (ref 2.0–3.0)

## 2018-05-18 NOTE — Patient Instructions (Signed)
Description   Continue taking 1.5 tablets daily. Recheck INR in 6 weeks.

## 2018-05-28 ENCOUNTER — Other Ambulatory Visit: Payer: Self-pay | Admitting: Interventional Cardiology

## 2018-06-03 ENCOUNTER — Other Ambulatory Visit: Payer: Self-pay | Admitting: Interventional Cardiology

## 2018-06-04 ENCOUNTER — Encounter: Payer: Self-pay | Admitting: Interventional Cardiology

## 2018-06-08 ENCOUNTER — Telehealth: Payer: Self-pay | Admitting: Interventional Cardiology

## 2018-06-08 NOTE — Telephone Encounter (Signed)
Attempted to contact pt about appt on 06/15/2018 with Dr. Tamala Julian.  No VM set up.  Will try again later.

## 2018-06-11 NOTE — Telephone Encounter (Signed)
Spoke with Rebecca Sandoval and she denies any cardiac issues at this time.  Rescheduled Rebecca Sandoval to see Dr. Tamala Julian on 09/22/2018.  Advised to call sooner if any issues.

## 2018-06-15 ENCOUNTER — Ambulatory Visit: Payer: Medicare Other | Admitting: Interventional Cardiology

## 2018-06-16 ENCOUNTER — Telehealth: Payer: Self-pay | Admitting: Interventional Cardiology

## 2018-06-16 NOTE — Telephone Encounter (Signed)
[facility] mail order pharmacy is requesting a refill on allopurinol and paroxetine. These medications were last refilled in 2016. Pt's LOV 06/18/17. Would Dr. Tamala Julian like to refill these medications? Please address

## 2018-06-16 NOTE — Telephone Encounter (Signed)
Called [facility] mail order pharmacy to inform them that they would have to request a refill for allopurinol and paroxetine from pt's PCP. Pharmacy tech verbalized understanding.

## 2018-06-16 NOTE — Telephone Encounter (Signed)
Those are not cardiac meds.  They will need to come from PCP.

## 2018-06-28 ENCOUNTER — Telehealth: Payer: Self-pay

## 2018-06-28 NOTE — Telephone Encounter (Signed)

## 2018-06-29 ENCOUNTER — Ambulatory Visit (INDEPENDENT_AMBULATORY_CARE_PROVIDER_SITE_OTHER): Payer: Medicare Other | Admitting: Pharmacist

## 2018-06-29 ENCOUNTER — Other Ambulatory Visit: Payer: Self-pay

## 2018-06-29 DIAGNOSIS — I4891 Unspecified atrial fibrillation: Secondary | ICD-10-CM | POA: Diagnosis not present

## 2018-06-29 DIAGNOSIS — Z5181 Encounter for therapeutic drug level monitoring: Secondary | ICD-10-CM | POA: Diagnosis not present

## 2018-06-29 DIAGNOSIS — I480 Paroxysmal atrial fibrillation: Secondary | ICD-10-CM

## 2018-06-29 LAB — POCT INR: INR: 1.8 — AB (ref 2.0–3.0)

## 2018-07-12 ENCOUNTER — Telehealth: Payer: Self-pay

## 2018-07-12 NOTE — Telephone Encounter (Signed)

## 2018-07-13 ENCOUNTER — Other Ambulatory Visit: Payer: Self-pay

## 2018-07-13 ENCOUNTER — Ambulatory Visit (INDEPENDENT_AMBULATORY_CARE_PROVIDER_SITE_OTHER): Payer: Medicare Other | Admitting: Pharmacist

## 2018-07-13 DIAGNOSIS — Z5181 Encounter for therapeutic drug level monitoring: Secondary | ICD-10-CM

## 2018-07-13 DIAGNOSIS — I4891 Unspecified atrial fibrillation: Secondary | ICD-10-CM | POA: Diagnosis not present

## 2018-07-13 DIAGNOSIS — I480 Paroxysmal atrial fibrillation: Secondary | ICD-10-CM | POA: Diagnosis not present

## 2018-07-13 LAB — POCT INR: INR: 3.3 — AB (ref 2.0–3.0)

## 2018-07-18 ENCOUNTER — Other Ambulatory Visit: Payer: Self-pay | Admitting: Interventional Cardiology

## 2018-08-06 ENCOUNTER — Telehealth: Payer: Self-pay

## 2018-08-06 NOTE — Telephone Encounter (Signed)

## 2018-08-10 ENCOUNTER — Ambulatory Visit (INDEPENDENT_AMBULATORY_CARE_PROVIDER_SITE_OTHER): Payer: Medicare Other | Admitting: Pharmacist

## 2018-08-10 ENCOUNTER — Other Ambulatory Visit: Payer: Self-pay

## 2018-08-10 DIAGNOSIS — Z5181 Encounter for therapeutic drug level monitoring: Secondary | ICD-10-CM

## 2018-08-10 DIAGNOSIS — I480 Paroxysmal atrial fibrillation: Secondary | ICD-10-CM

## 2018-08-10 DIAGNOSIS — I4891 Unspecified atrial fibrillation: Secondary | ICD-10-CM

## 2018-08-10 LAB — POCT INR: INR: 1.9 — AB (ref 2.0–3.0)

## 2018-08-31 ENCOUNTER — Telehealth: Payer: Self-pay

## 2018-08-31 NOTE — Telephone Encounter (Signed)
lmom for prescreen  

## 2018-09-06 ENCOUNTER — Telehealth: Payer: Self-pay

## 2018-09-06 NOTE — Progress Notes (Signed)
Cardiology Office Note:    Date:  09/07/2018   ID:  Rebecca Sandoval, DOB September 26, 1935, MRN [id number]  PCP:  Rebecca Amel, MD  Cardiologist:  No primary care provider on file.   Referring MD: Rebecca Penna, MD   Chief Complaint  Patient presents with   Atrial Fibrillation    History of Present Illness:    Rebecca Sandoval is a 83 y.o. female with a hx of paroxysmal atrial fibrillation with tachybrady syndrome presents for follow up.   She feels well.  No cardiac symptoms.  Specifically denies orthopnea, PND, lower extremity swelling, palpitations, and syncope.  No medication side effects.  No bleeding.  No transient neurological symptoms.  Past Medical History:  Diagnosis Date   Anxiety    Complication of anesthesia    Difficult to wake up.   Diverticulitis    GERD (gastroesophageal reflux disease)    Hemorrhoid    Obesity    Paroxysmal atrial fibrillation (HCC)    Tachycardia-bradycardia (HCC)     Past Surgical History:  Procedure Laterality Date   ABDOMINAL HYSTERECTOMY  1975   APPENDECTOMY     bartholyn cyst  1973   BILATERAL TEMPOROMANDIBULAR JOINT ARTHROPLASTY  1977   BREAST LUMPECTOMY WITH RADIOACTIVE SEED LOCALIZATION Left 07/28/2014   Procedure: LEFT BREAST LUMPECTOMY WITH RADIOACTIVE SEED LOCALIZATION;  Surgeon: Rebecca Klein, MD;  Location: [facility];  Service: General;  Laterality: Left;   diverticulitis  [place]   hiatal   SHOULDER SURGERY     left 97, rigth 98   TONSILLECTOMY      Current Medications: Current Meds  Medication Sig   acetaminophen (TYLENOL) 325 MG tablet Take 650 mg by mouth every 6 (six) hours as needed (pain).   allopurinol (ZYLOPRIM) 300 MG tablet Take 300 mg by mouth daily.   flecainide (TAMBOCOR) 50 MG tablet TAKE 1 TABLET BY MOUTH TWO  TIMES DAILY   fluticasone (FLONASE) 50 MCG/ACT nasal spray Place 1 spray into the nose as needed for allergies.    LORazepam (ATIVAN) 0.5 MG tablet Take  0.5 mg by mouth every 8 (eight) hours as needed for anxiety or sleep.    metoprolol succinate (TOPROL-XL) 25 MG 24 hr tablet TAKE 1 TABLET BY MOUTH  DAILY   ondansetron (ZOFRAN-ODT) 4 MG disintegrating tablet Take 4 mg by mouth every 6 (six) hours as needed for nausea/vomiting.   oxyCODONE-acetaminophen (ROXICET) 5-325 MG per tablet Take 1-2 tablets by mouth every 4 (four) hours as needed for severe pain.   PARoxetine (PAXIL) 20 MG tablet Take 1 tablet (20 mg total) by mouth every morning.   triamterene-hydrochlorothiazide (MAXZIDE-25) 37.5-25 MG tablet TAKE 1 TABLET BY MOUTH  DAILY   warfarin (COUMADIN) 5 MG tablet TAKE 1.5 TABLETS BY MOUTH  DAILY OR AS DIRECTED BY  ANTICOAGULATION CLINIC     Allergies:   Prednisone and Tetanus toxoids   Social History   Socioeconomic History   Marital status: Married    Spouse name: Not on file   Number of children: Not on file   Years of education: Not on file   Highest education level: Not on file  Occupational History   Not on file  Social Needs   Financial resource strain: Not on file   Food insecurity    Worry: Not on file    Inability: Not on file   Transportation needs    Medical: Not on file    Non-medical: Not on file  Tobacco Use   Smoking status: Former Smoker   Smokeless tobacco: Never Used  Substance and Sexual Activity   Alcohol use: No   Drug use: No   Sexual activity: Not on file  Lifestyle   Physical activity    Days per week: Not on file    Minutes per session: Not on file   Stress: Not on file  Relationships   Social connections    Talks on phone: Not on file    Gets together: Not on file    Attends religious service: Not on file    Active member of club or organization: Not on file    Attends meetings of clubs or organizations: Not on file    Relationship status: Not on file  Other Topics Concern   Not on file  Social History Narrative   Pt lives in [place] with spouse   Works as a  Community education officer at [employer].     Family History: The patient's family history includes Cancer in her paternal aunt and paternal uncle; Stroke (age of onset: 66) in her mother.  ROS:   Please see the history of present illness.    Lost 30 pounds, purposefully by cutting out empty calories/carbohydrates, soda pop, and pastries.  All other systems reviewed and are negative.  EKGs/Labs/Other Studies Reviewed:    The following studies were reviewed today: No new functional data  EKG:  EKG sinus bradycardia at rate of 50 bpm.  Decreased voltage.  Tracing performed on 09/07/2018  Recent Labs: No results found for requested labs within last 8760 hours.  Recent Lipid Panel No results found for: CHOL, TRIG, HDL, CHOLHDL, VLDL, LDLCALC, LDLDIRECT  Physical Exam:    VS:  BP 114/64    Pulse (!) 50    Ht $R'5\' 6"'gi$  (1.676 m)    Wt 176 lb 12.8 oz (80.2 kg)    SpO2 97%    BMI 28.54 kg/m     Wt Readings from Last 3 Encounters:  09/07/18 176 lb 12.8 oz (80.2 kg)  06/18/17 181 lb 6.4 oz (82.3 kg)  05/27/17 182 lb 12.8 oz (82.9 kg)     GEN: Mild to moderate obesity. No acute distress HEENT: Normal NECK: No JVD. LYMPHATICS: No lymphadenopathy CARDIAC: RRR.  No murmur, no gallop, no edema VASCULAR: 2+ bilateral radial pulses, no bruits RESPIRATORY:  Clear to auscultation without rales, wheezing or rhonchi  ABDOMEN: Soft, non-tender, non-distended, No pulsatile mass, MUSCULOSKELETAL: No deformity  SKIN: Warm and dry NEUROLOGIC:  Alert and oriented x 3 PSYCHIATRIC:  Normal affect   ASSESSMENT:    1. Paroxysmal atrial fibrillation (HCC)   2. Chronic anticoagulation   3. Essential hypertension   4. Tachycardia-bradycardia syndrome ([place])   5. Educated About Covid-19 Virus Infection    PLAN:    In order of problems listed above:  1. She is doing well without symptomatic episodes of atrial fibrillation. 2. No bleeding on her current anticoagulation regimen. 3. Excellent blood pressure as  recorded. 4. No episodes of syncope or excessive bradycardia.   Medication Adjustments/Labs and Tests Ordered: Current medicines are reviewed at length with the patient today.  Concerns regarding medicines are outlined above.  Orders Placed This Encounter  Procedures   EKG 12-Lead   No orders of the defined types were placed in this encounter.   Patient Instructions  Medication Instructions:  Your physician recommends that you continue on your current medications as directed. Please refer to the Current Medication list given to  you today.  If you need a refill on your cardiac medications before your next appointment, please call your pharmacy.   Lab work: None If you have labs (blood work) drawn today and your tests are completely normal, you will receive your results only by:  [place] (if you have MyChart) OR  A paper copy in the mail If you have any lab test that is abnormal or we need to change your treatment, we will call you to review the results.  Testing/Procedures: None  Follow-Up: At [facility], you and your health needs are our priority.  As part of our continuing mission to provide you with exceptional heart care, we have created designated Provider Care Teams.  These Care Teams include your primary Cardiologist (physician) and Advanced Practice Providers (APPs -  Physician Assistants and Nurse Practitioners) who all work together to provide you with the care you need, when you need it. You will need a follow up appointment in 12 months.  Please call our office 2 months in advance to schedule this appointment.  You may see Dr. Tamala Julian.  or one of the following Advanced Practice Providers on your designated Care Team:   Truitt Merle, NP Cecilie Kicks, NP  Kathyrn Drown, NP  Any Other Special Instructions Will Be Listed Below (If Applicable).       Signed, Sinclair Grooms, MD  09/07/2018 11:52 AM    [place]

## 2018-09-06 NOTE — Telephone Encounter (Signed)

## 2018-09-07 ENCOUNTER — Ambulatory Visit (INDEPENDENT_AMBULATORY_CARE_PROVIDER_SITE_OTHER): Payer: Medicare Other | Admitting: Interventional Cardiology

## 2018-09-07 ENCOUNTER — Ambulatory Visit (INDEPENDENT_AMBULATORY_CARE_PROVIDER_SITE_OTHER): Payer: Medicare Other | Admitting: Pharmacist

## 2018-09-07 ENCOUNTER — Other Ambulatory Visit: Payer: Self-pay

## 2018-09-07 ENCOUNTER — Encounter (INDEPENDENT_AMBULATORY_CARE_PROVIDER_SITE_OTHER): Payer: Self-pay

## 2018-09-07 ENCOUNTER — Encounter: Payer: Self-pay | Admitting: Interventional Cardiology

## 2018-09-07 VITALS — BP 114/64 | HR 50 | Ht 66.0 in | Wt 176.8 lb

## 2018-09-07 DIAGNOSIS — I480 Paroxysmal atrial fibrillation: Secondary | ICD-10-CM

## 2018-09-07 DIAGNOSIS — Z7189 Other specified counseling: Secondary | ICD-10-CM

## 2018-09-07 DIAGNOSIS — I495 Sick sinus syndrome: Secondary | ICD-10-CM | POA: Diagnosis not present

## 2018-09-07 DIAGNOSIS — I10 Essential (primary) hypertension: Secondary | ICD-10-CM

## 2018-09-07 DIAGNOSIS — Z5181 Encounter for therapeutic drug level monitoring: Secondary | ICD-10-CM

## 2018-09-07 DIAGNOSIS — Z7901 Long term (current) use of anticoagulants: Secondary | ICD-10-CM | POA: Diagnosis not present

## 2018-09-07 DIAGNOSIS — I4891 Unspecified atrial fibrillation: Secondary | ICD-10-CM | POA: Diagnosis not present

## 2018-09-07 LAB — POCT INR: INR: 1.9 — AB (ref 2.0–3.0)

## 2018-09-07 NOTE — Patient Instructions (Signed)
Description   Continue taking 1.5 tablets daily. Recheck INR in 4 weeks.

## 2018-09-07 NOTE — Patient Instructions (Signed)

## 2018-09-22 ENCOUNTER — Ambulatory Visit: Payer: Medicare Other | Admitting: Interventional Cardiology

## 2018-10-04 ENCOUNTER — Telehealth: Payer: Self-pay

## 2018-10-04 NOTE — Telephone Encounter (Signed)
1. COVID-19 Pre-Screening Questions:  . In the past 7 to 10 days have you had a cough,  shortness of breath, headache, congestion, fever (100 or greater) body aches, chills, sore throat, or sudden loss of taste or sense of smell?  yes . Have you been around anyone with known Covid 19. no . Have you been around anyone who is awaiting Covid 19 test results in the past 7 to 10 days?  no . Have you been around anyone who has been exposed to Covid 19, or has mentioned symptoms of Covid 19 within the past 7 to 10 days?  no    2. Pt advised of visitor restrictions (no visitors allowed except if needed to conduct the visit). Also advised to arrive at appointment time and wear a mask.  Instructed the pt to call pcp for covid test and that to come back to the clinic a covid test is needed

## 2018-10-10 ENCOUNTER — Other Ambulatory Visit: Payer: Self-pay | Admitting: Interventional Cardiology

## 2018-11-06 ENCOUNTER — Other Ambulatory Visit: Payer: Self-pay | Admitting: Interventional Cardiology

## 2018-11-08 NOTE — Telephone Encounter (Signed)
Pt needs an appt, last seen 09/07/2018. Called pt and spoke with her as she states she thought her appt was on 11/12/2018, advised she missed her appt on 11/03/2018 so made her an appt for tomorrow. She has enough meds until then.

## 2018-11-09 ENCOUNTER — Other Ambulatory Visit: Payer: Self-pay

## 2018-11-09 ENCOUNTER — Ambulatory Visit (INDEPENDENT_AMBULATORY_CARE_PROVIDER_SITE_OTHER): Payer: Medicare Other | Admitting: *Deleted

## 2018-11-09 DIAGNOSIS — Z5181 Encounter for therapeutic drug level monitoring: Secondary | ICD-10-CM

## 2018-11-09 DIAGNOSIS — I4891 Unspecified atrial fibrillation: Secondary | ICD-10-CM

## 2018-11-09 DIAGNOSIS — I480 Paroxysmal atrial fibrillation: Secondary | ICD-10-CM

## 2018-11-09 LAB — POCT INR: INR: 1.9 — AB (ref 2.0–3.0)

## 2018-11-09 NOTE — Patient Instructions (Signed)
Description   Take 2 tablets today, then start taking 1.5 tablets daily except 2 tablets on Saturdays. Recheck INR in 4 weeks.

## 2018-12-07 ENCOUNTER — Other Ambulatory Visit: Payer: Self-pay

## 2018-12-07 ENCOUNTER — Ambulatory Visit (INDEPENDENT_AMBULATORY_CARE_PROVIDER_SITE_OTHER): Payer: Medicare Other | Admitting: *Deleted

## 2018-12-07 DIAGNOSIS — Z5181 Encounter for therapeutic drug level monitoring: Secondary | ICD-10-CM | POA: Diagnosis not present

## 2018-12-07 DIAGNOSIS — I480 Paroxysmal atrial fibrillation: Secondary | ICD-10-CM

## 2018-12-07 DIAGNOSIS — I4891 Unspecified atrial fibrillation: Secondary | ICD-10-CM | POA: Diagnosis not present

## 2018-12-07 LAB — POCT INR: INR: 2.1 (ref 2.0–3.0)

## 2018-12-07 NOTE — Patient Instructions (Signed)
Description   Continue taking 1.5 tablets daily except 2 tablets on Saturdays. Recheck INR in 4 weeks. Coumadin Clinic [phone]

## 2019-01-04 ENCOUNTER — Other Ambulatory Visit: Payer: Self-pay

## 2019-01-04 ENCOUNTER — Ambulatory Visit: Payer: Medicare Other | Admitting: *Deleted

## 2019-01-04 DIAGNOSIS — I4891 Unspecified atrial fibrillation: Secondary | ICD-10-CM

## 2019-01-04 DIAGNOSIS — Z5181 Encounter for therapeutic drug level monitoring: Secondary | ICD-10-CM | POA: Diagnosis not present

## 2019-01-04 DIAGNOSIS — I480 Paroxysmal atrial fibrillation: Secondary | ICD-10-CM

## 2019-01-04 LAB — POCT INR: INR: 2.3 (ref 2.0–3.0)

## 2019-01-04 NOTE — Patient Instructions (Signed)
Description   Continue taking 1.5 tablets daily except 2 tablets on Saturdays. Recheck INR in 6 weeks. Coumadin Clinic [phone]

## 2019-02-08 ENCOUNTER — Other Ambulatory Visit: Payer: Self-pay | Admitting: Interventional Cardiology

## 2019-02-15 ENCOUNTER — Other Ambulatory Visit: Payer: Self-pay

## 2019-02-15 ENCOUNTER — Ambulatory Visit: Payer: Medicare Other | Admitting: *Deleted

## 2019-02-15 DIAGNOSIS — I4891 Unspecified atrial fibrillation: Secondary | ICD-10-CM

## 2019-02-15 DIAGNOSIS — I480 Paroxysmal atrial fibrillation: Secondary | ICD-10-CM | POA: Diagnosis not present

## 2019-02-15 DIAGNOSIS — Z5181 Encounter for therapeutic drug level monitoring: Secondary | ICD-10-CM | POA: Diagnosis not present

## 2019-02-15 LAB — POCT INR: INR: 2.7 (ref 2.0–3.0)

## 2019-02-15 NOTE — Patient Instructions (Signed)
Description   Continue taking 1.5 tablets daily except 2 tablets on Saturdays. Recheck INR in 6 weeks. Coumadin Clinic [phone]

## 2019-03-28 ENCOUNTER — Other Ambulatory Visit: Payer: Self-pay | Admitting: Interventional Cardiology

## 2019-03-28 MED ORDER — WARFARIN SODIUM 5 MG PO TABS: ORAL_TABLET | ORAL | 0 refills | Status: DC

## 2019-03-28 NOTE — Telephone Encounter (Signed)
Pt calling stating that she needs a 10 day supply of Warfarin sent to [facility] and a 90 supply sent to [facility] mail order pharmacy. Pt states that [facility] has messed up her medication warfarin. Pt would like a call back concerning this matter. Please address

## 2019-03-29 ENCOUNTER — Encounter (INDEPENDENT_AMBULATORY_CARE_PROVIDER_SITE_OTHER): Payer: Self-pay

## 2019-03-29 ENCOUNTER — Ambulatory Visit: Payer: Medicare Other | Admitting: *Deleted

## 2019-03-29 ENCOUNTER — Other Ambulatory Visit: Payer: Self-pay

## 2019-03-29 DIAGNOSIS — I4891 Unspecified atrial fibrillation: Secondary | ICD-10-CM | POA: Diagnosis not present

## 2019-03-29 DIAGNOSIS — Z5181 Encounter for therapeutic drug level monitoring: Secondary | ICD-10-CM

## 2019-03-29 DIAGNOSIS — I480 Paroxysmal atrial fibrillation: Secondary | ICD-10-CM | POA: Diagnosis not present

## 2019-03-29 LAB — POCT INR: INR: 2.9 (ref 2.0–3.0)

## 2019-03-29 NOTE — Patient Instructions (Signed)
Description   Continue taking 1.5 tablets daily except 2 tablets on Saturdays. Recheck INR in 7 weeks. Coumadin Clinic [phone]

## 2019-04-05 ENCOUNTER — Other Ambulatory Visit: Payer: Self-pay | Admitting: Pharmacist

## 2019-04-05 MED ORDER — WARFARIN SODIUM 5 MG PO TABS: ORAL_TABLET | ORAL | 0 refills | Status: DC

## 2019-05-08 ENCOUNTER — Ambulatory Visit: Payer: Medicare Other | Attending: Internal Medicine

## 2019-05-08 DIAGNOSIS — Z23 Encounter for immunization: Secondary | ICD-10-CM

## 2019-05-08 NOTE — Progress Notes (Signed)
   Covid-19 Vaccination Clinic  Name:  IDALY VERRET    MRN: [id number] DOB: 10-Mar-1936  05/08/2019  Ms. Abraha was observed post Covid-19 immunization for 15 minutes without incidence. She was provided with Vaccine Information Sheet and instruction to access the V-Safe system.   Ms. Rochford was instructed to call 911 with any severe reactions post vaccine: Marland Kitchen Difficulty breathing  . Swelling of your face and throat  . A fast heartbeat  . A bad rash all over your body  . Dizziness and weakness    Immunizations Administered    Name Date Dose VIS Date Route   Pfizer COVID-19 Vaccine 05/08/2019  8:16 AM 0.3 mL 02/25/2019 Intramuscular   Manufacturer: [place]   Lot: [id number]   [place]: [id number]

## 2019-05-17 ENCOUNTER — Other Ambulatory Visit: Payer: Self-pay

## 2019-05-17 ENCOUNTER — Ambulatory Visit: Payer: Medicare Other | Admitting: *Deleted

## 2019-05-17 DIAGNOSIS — Z5181 Encounter for therapeutic drug level monitoring: Secondary | ICD-10-CM | POA: Diagnosis not present

## 2019-05-17 DIAGNOSIS — I4891 Unspecified atrial fibrillation: Secondary | ICD-10-CM

## 2019-05-17 DIAGNOSIS — I480 Paroxysmal atrial fibrillation: Secondary | ICD-10-CM

## 2019-05-17 LAB — POCT INR: INR: 1.9 — AB (ref 2.0–3.0)

## 2019-05-17 NOTE — Patient Instructions (Signed)
Description   Take 2 tablets today and then continue taking 1.5 tablets daily except 2 tablets on Saturdays. Recheck INR in 4 weeks. Coumadin Clinic [phone]

## 2019-05-31 ENCOUNTER — Ambulatory Visit: Payer: Medicare Other | Attending: Internal Medicine

## 2019-05-31 DIAGNOSIS — Z23 Encounter for immunization: Secondary | ICD-10-CM

## 2019-05-31 NOTE — Progress Notes (Signed)
   Covid-19 Vaccination Clinic  Name:  Rebecca Sandoval    MRN: [id number] DOB: June 10, 1935  05/31/2019  [name] was observed post Covid-19 immunization for 30 minutes based on pre-vaccination screening without incident. She was provided with Vaccine Information Sheet and instruction to access the V-Safe system.   [name] was instructed to call 911 with any severe reactions post vaccine: Marland Kitchen Difficulty breathing  . Swelling of face and throat  . A fast heartbeat  . A bad rash all over body  . Dizziness and weakness   Immunizations Administered    Name Date Dose VIS Date Route   Pfizer COVID-19 Vaccine 05/31/2019  2:29 PM 0.3 mL 02/25/2019 Intramuscular   Manufacturer: [place]   Lot: [id number]   [place]: [address]-0

## 2019-06-21 ENCOUNTER — Encounter (INDEPENDENT_AMBULATORY_CARE_PROVIDER_SITE_OTHER): Payer: Self-pay

## 2019-06-21 ENCOUNTER — Other Ambulatory Visit: Payer: Self-pay

## 2019-06-21 ENCOUNTER — Ambulatory Visit (INDEPENDENT_AMBULATORY_CARE_PROVIDER_SITE_OTHER): Payer: Medicare Other | Admitting: *Deleted

## 2019-06-21 DIAGNOSIS — I480 Paroxysmal atrial fibrillation: Secondary | ICD-10-CM

## 2019-06-21 DIAGNOSIS — Z5181 Encounter for therapeutic drug level monitoring: Secondary | ICD-10-CM

## 2019-06-21 DIAGNOSIS — I4891 Unspecified atrial fibrillation: Secondary | ICD-10-CM

## 2019-06-21 LAB — POCT INR: INR: 2.5 (ref 2.0–3.0)

## 2019-06-21 NOTE — Patient Instructions (Signed)
Description   Continue taking 1.5 tablets daily except 2 tablets on Saturdays. Recheck INR in 6 weeks. Coumadin Clinic [phone]

## 2019-07-12 ENCOUNTER — Other Ambulatory Visit: Payer: Self-pay | Admitting: Interventional Cardiology

## 2019-07-12 MED ORDER — WARFARIN SODIUM 5 MG PO TABS: ORAL_TABLET | ORAL | 0 refills | Status: DC

## 2019-07-12 NOTE — Telephone Encounter (Signed)
Sent refill for 90 day supply of Warfarin to [facility]. Pt wanted it sent to [facility], [facility] and cancelled refill for 90 day supply and sent refill to [facility] for 90 day supply of Warfarin. Pt stated she only had enough warfarin for 1 more day. Informed pt I would send her a 10 day supply of warfarin to [facility].

## 2019-07-12 NOTE — Addendum Note (Signed)
Addended by: Johny Shock B on: 07/12/2019 12:09 PM   Modules accepted: Orders

## 2019-07-12 NOTE — Addendum Note (Signed)
Addended by: Johny Shock B on: 07/12/2019 12:22 PM   Modules accepted: Orders

## 2019-07-12 NOTE — Telephone Encounter (Signed)
*  STAT* If patient is at the pharmacy, call can be transferred to refill team.   1. Which medications need to be refilled? (please list name of each medication and dose if known) warfarin (COUMADIN) 5 MG tablet  2. Which pharmacy/location (including street and city if local pharmacy) is medication to be sent to? [place], [place]  3. Do they need a 30 day or 90 day supply? 90 day  Patient is out of medication

## 2019-07-26 ENCOUNTER — Other Ambulatory Visit: Payer: Self-pay | Admitting: Interventional Cardiology

## 2019-07-26 MED ORDER — WARFARIN SODIUM 5 MG PO TABS: ORAL_TABLET | ORAL | 0 refills | Status: DC

## 2019-07-26 NOTE — Telephone Encounter (Signed)
°*  STAT* If patient is at the pharmacy, call can be transferred to refill team.   1. Which medications need to be refilled? (please list name of each medication and dose if known) warfarin (COUMADIN) 5 MG tablet  2. Which pharmacy/location (including street and city if local pharmacy) is medication to be sent to? [place], [place]  3. Do they need a 30 day or 90 day supply? 30 day  Patient is out of medication.

## 2019-08-02 ENCOUNTER — Other Ambulatory Visit: Payer: Self-pay

## 2019-08-02 ENCOUNTER — Ambulatory Visit (INDEPENDENT_AMBULATORY_CARE_PROVIDER_SITE_OTHER): Payer: Medicare Other | Admitting: *Deleted

## 2019-08-02 DIAGNOSIS — I480 Paroxysmal atrial fibrillation: Secondary | ICD-10-CM

## 2019-08-02 DIAGNOSIS — I4891 Unspecified atrial fibrillation: Secondary | ICD-10-CM

## 2019-08-02 DIAGNOSIS — Z5181 Encounter for therapeutic drug level monitoring: Secondary | ICD-10-CM | POA: Diagnosis not present

## 2019-08-02 LAB — POCT INR: INR: 1.8 — AB (ref 2.0–3.0)

## 2019-08-02 NOTE — Patient Instructions (Signed)
Description   Take 2 tablets today and then continue taking 1.5 tablets daily except 2 tablets on Saturdays. Recheck INR in 5 weeks. Coumadin Clinic [phone]

## 2019-08-06 ENCOUNTER — Other Ambulatory Visit: Payer: Self-pay | Admitting: Interventional Cardiology

## 2019-09-16 ENCOUNTER — Other Ambulatory Visit: Payer: Self-pay | Admitting: Interventional Cardiology

## 2019-09-28 NOTE — Telephone Encounter (Signed)
Pt is overdue for INR check, multiple phone calls made to the pt and she has not answered so we have left voicemails for her. She has an appt with Dr. Tamala Julian on 10/03/2019 at 940am so added her on to [facility] Schedule for  1145am.

## 2019-10-02 NOTE — Progress Notes (Deleted)
Cardiology Office Note:    Date:  10/02/2019   ID:  Rebecca Sandoval, DOB [birth year], MRN [id number]  PCP:  Rebecca Amel, MD  Cardiologist:  Sinclair Grooms, MD   Referring MD: Rebecca Amel, MD   No chief complaint on file.   History of Present Illness:    Rebecca Sandoval is a 84 y.o. female with a hx of paroxysmal atrial fibrillation with Tachy-Brady syndrome, Flecainide therapy, and chronic anticoagulation.  ***  Past Medical History:  Diagnosis Date  . Anxiety   . Complication of anesthesia    Difficult to wake up.  . Diverticulitis   . GERD (gastroesophageal reflux disease)   . Hemorrhoid   . Obesity   . Paroxysmal atrial fibrillation (HCC)   . Tachycardia-bradycardia [facility])     Past Surgical History:  Procedure Laterality Date  . ABDOMINAL HYSTERECTOMY  1975  . APPENDECTOMY    . bartholyn cyst  1973  . BILATERAL TEMPOROMANDIBULAR JOINT ARTHROPLASTY  1977  . BREAST LUMPECTOMY WITH RADIOACTIVE SEED LOCALIZATION Left 07/28/2014   Procedure: LEFT BREAST LUMPECTOMY WITH RADIOACTIVE SEED LOCALIZATION;  Surgeon: [name], MD;  Location: [facility];  Service: General;  Laterality: Left;  . diverticulitis  1986  . HERNIA REPAIR  1996   hiatal  . SHOULDER SURGERY     left 97, rigth 98  . TONSILLECTOMY      Current Medications: No outpatient medications have been marked as taking for the 10/03/19 encounter (Appointment) with Belva Crome, MD.     Allergies:   Prednisone and Tetanus toxoids   Social History   Socioeconomic History  . Marital status: Married    Spouse name: Not on file  . Number of children: Not on file  . Years of education: Not on file  . Highest education level: Not on file  Occupational History  . Not on file  Tobacco Use  . Smoking status: Former Research scientist (life sciences)  . Smokeless tobacco: Never Used  Vaping Use  . Vaping Use: Never used  Substance and Sexual Activity  . Alcohol use: No  . Drug use: No  . Sexual activity: Not on file  Other  Topics Concern  . Not on file  Social History Narrative   Pt lives in [place] with spouse   Works as a Community education officer at [employer].   Social Determinants of Health   Financial Resource Strain:   . Difficulty of Paying Living Expenses:   Food Insecurity:   . Worried About Charity fundraiser in the Last Year:   . Arboriculturist in the Last Year:   Transportation Needs:   . Film/video editor (Medical):   Marland Kitchen Lack of Transportation (Non-Medical):   Physical Activity:   . Days of Exercise per Week:   . Minutes of Exercise per Session:   Stress:   . Feeling of Stress :   Social Connections:   . Frequency of Communication with Friends and Family:   . Frequency of Social Gatherings with Friends and Family:   . Attends Religious Services:   . Active Member of Clubs or Organizations:   . Attends Archivist Meetings:   Marland Kitchen Marital Status:      Family History: The patient's family history includes Cancer in her paternal aunt and paternal uncle; Stroke (age of onset: 57) in her mother.  ROS:   Please see the history of present illness.    *** All other systems reviewed and are negative.  EKGs/Labs/Other Studies Reviewed:    The following studies were reviewed today: ***  EKG:  EKG ***  Recent Labs: No results found for requested labs within last 8760 hours.  Recent Lipid Panel No results found for: CHOL, TRIG, HDL, CHOLHDL, VLDL, LDLCALC, LDLDIRECT  Physical Exam:    VS:  There were no vitals taken for this visit.    Wt Readings from Last 3 Encounters:  09/07/18 176 lb 12.8 oz (80.2 kg)  06/18/17 181 lb 6.4 oz (82.3 kg)  05/27/17 182 lb 12.8 oz (82.9 kg)     GEN: ***. No acute distress HEENT: Normal NECK: No JVD. LYMPHATICS: No lymphadenopathy CARDIAC: *** RRR without murmur, gallop, or edema. VASCULAR: *** Normal Pulses. No bruits. RESPIRATORY:  Clear to auscultation without rales, wheezing or rhonchi  ABDOMEN: Soft, non-tender, non-distended, No  pulsatile mass, MUSCULOSKELETAL: No deformity  SKIN: Warm and dry NEUROLOGIC:  Alert and oriented x 3 PSYCHIATRIC:  Normal affect   ASSESSMENT:    1. Paroxysmal atrial fibrillation (HCC)   2. Chronic anticoagulation   3. Essential hypertension   4. Tachycardia-bradycardia syndrome ([place])   5. Educated about COVID-19 virus infection   6. Encounter for therapeutic drug monitoring    PLAN:    In order of problems listed above:  1. ***   Medication Adjustments/Labs and Tests Ordered: Current medicines are reviewed at length with the patient today.  Concerns regarding medicines are outlined above.  No orders of the defined types were placed in this encounter.  No orders of the defined types were placed in this encounter.   There are no Patient Instructions on file for this visit.   Signed, Sinclair Grooms, MD  10/02/2019 6:49 PM    [place]

## 2019-10-03 ENCOUNTER — Ambulatory Visit: Payer: Medicare Other | Admitting: Interventional Cardiology

## 2019-10-03 NOTE — Telephone Encounter (Signed)
Pt cancelled appt for Dr Tamala Julian and Coumadin Clinic today.  Pt did reschedule Coumadin Clinic appt to 10/06/19 at 3:15pm.

## 2019-10-06 ENCOUNTER — Ambulatory Visit (INDEPENDENT_AMBULATORY_CARE_PROVIDER_SITE_OTHER): Payer: Medicare Other | Admitting: Pharmacist

## 2019-10-06 ENCOUNTER — Other Ambulatory Visit: Payer: Self-pay

## 2019-10-06 DIAGNOSIS — I480 Paroxysmal atrial fibrillation: Secondary | ICD-10-CM

## 2019-10-06 DIAGNOSIS — I4891 Unspecified atrial fibrillation: Secondary | ICD-10-CM | POA: Diagnosis not present

## 2019-10-06 DIAGNOSIS — Z5181 Encounter for therapeutic drug level monitoring: Secondary | ICD-10-CM | POA: Diagnosis not present

## 2019-10-06 LAB — POCT INR: INR: 3 (ref 2.0–3.0)

## 2019-10-06 NOTE — Patient Instructions (Signed)
Continue taking 1.5 tablets daily except 2 tablets on Saturdays. Recheck INR in 5 weeks. Coumadin Clinic [phone]

## 2019-10-30 ENCOUNTER — Other Ambulatory Visit: Payer: Self-pay | Admitting: Interventional Cardiology

## 2019-11-08 ENCOUNTER — Ambulatory Visit (INDEPENDENT_AMBULATORY_CARE_PROVIDER_SITE_OTHER): Payer: Medicare Other

## 2019-11-08 ENCOUNTER — Other Ambulatory Visit: Payer: Self-pay

## 2019-11-08 DIAGNOSIS — I480 Paroxysmal atrial fibrillation: Secondary | ICD-10-CM | POA: Diagnosis not present

## 2019-11-08 DIAGNOSIS — Z5181 Encounter for therapeutic drug level monitoring: Secondary | ICD-10-CM

## 2019-11-08 DIAGNOSIS — I4891 Unspecified atrial fibrillation: Secondary | ICD-10-CM

## 2019-11-08 LAB — POCT INR: INR: 2.7 (ref 2.0–3.0)

## 2019-11-08 NOTE — Patient Instructions (Signed)
Description   Continue taking 1.5 tablets daily except 2 tablets on Saturdays. Recheck INR in 6 weeks. Coumadin Clinic [phone]

## 2019-11-30 NOTE — Progress Notes (Signed)
Cardiology Office Note:    Date:  12/01/2019   ID:  [name], DOB 10/13/1935, MRN [id number]  PCP:  Darrow Bussing, MD  Cardiologist:  Lesleigh Noe, MD   Referring MD: Darrow Bussing, MD   Chief Complaint  Patient presents with  . Atrial Fibrillation    History of Present Illness:    Rebecca Sandoval is a 84 y.o. female with a hx of  paroxysmal atrial fibrillation, tachybrady syndrome, anticoagulation, and hypertension.  No interval hospitalizations or acute illness episodes.  No bleeding episodes on Coumadin.  She had an episode of severe dizziness while walking across the parking lot about 2 weeks ago.  It came out of nowhere and was approximately 5 to 10 seconds after having gotten out of her car.  She started having daily vision, was severely dizzy, but after another 5 seconds or so she felt normal again.  She has never experienced that before or since that time.  She has not had chest pain, dyspnea, orthopnea, edema, or other CV complaints such as claudication.  Past Medical History:  Diagnosis Date  . Anxiety   . Complication of anesthesia    Difficult to wake up.  . Diverticulitis   . GERD (gastroesophageal reflux disease)   . Hemorrhoid   . Obesity   . Paroxysmal atrial fibrillation (HCC)   . Tachycardia-bradycardia [facility])     Past Surgical History:  Procedure Laterality Date  . ABDOMINAL HYSTERECTOMY  1975  . APPENDECTOMY    . bartholyn cyst  1973  . BILATERAL TEMPOROMANDIBULAR JOINT ARTHROPLASTY  1977  . BREAST LUMPECTOMY WITH RADIOACTIVE SEED LOCALIZATION Left 07/28/2014   Procedure: LEFT BREAST LUMPECTOMY WITH RADIOACTIVE SEED LOCALIZATION;  Surgeon: Almond Lint, MD;  Location: [facility] OR;  Service: General;  Laterality: Left;  . diverticulitis  1986  . HERNIA REPAIR  1996   hiatal  . SHOULDER SURGERY     left 97, rigth 98  . TONSILLECTOMY      Current Medications: Current Meds  Medication Sig  . acetaminophen (TYLENOL) 325 MG tablet Take  650 mg by mouth every 6 (six) hours as needed (pain).  Marland Kitchen allopurinol (ZYLOPRIM) 300 MG tablet Take 300 mg by mouth daily.  . flecainide (TAMBOCOR) 50 MG tablet Take 1 tablet (50 mg total) by mouth 2 (two) times daily. Please keep upcoming appt with Dr. Katrinka Blazing in September before anymore refills. Thank you  . fluticasone (FLONASE) 50 MCG/ACT nasal spray Place 1 spray into the nose as needed for allergies.   Marland Kitchen LORazepam (ATIVAN) 0.5 MG tablet Take 0.5 mg by mouth every 8 (eight) hours as needed for anxiety or sleep.   . metoprolol succinate (TOPROL-XL) 25 MG 24 hr tablet Take 1 tablet (25 mg total) by mouth daily. Please keep upcoming appt in September with Dr. Katrinka Blazing before anymore refills. Thank you  . ondansetron (ZOFRAN-ODT) 4 MG disintegrating tablet Take 4 mg by mouth every 6 (six) hours as needed for nausea/vomiting.  Marland Kitchen oxyCODONE-acetaminophen (ROXICET) 5-325 MG per tablet Take 1-2 tablets by mouth every 4 (four) hours as needed for severe pain.  Marland Kitchen PARoxetine (PAXIL) 20 MG tablet Take 1 tablet (20 mg total) by mouth every morning.  . triamterene-hydrochlorothiazide (MAXZIDE-25) 37.5-25 MG tablet Take 1 tablet by mouth daily. Please keep upcoming appt with Dr. Katrinka Blazing before anymore refills. Thank you  . warfarin (COUMADIN) 5 MG tablet TAKE 1 AND 1/2 TO 2 TABLETS BY MOUTH DAILY AS DIRECTED  BY THE COUMADIN CLINIC  Allergies:   Prednisone and Tetanus toxoids   Social History   Socioeconomic History  . Marital status: Married    Spouse name: Not on file  . Number of children: Not on file  . Years of education: Not on file  . Highest education level: Not on file  Occupational History  . Not on file  Tobacco Use  . Smoking status: Former Research scientist (life sciences)  . Smokeless tobacco: Never Used  Vaping Use  . Vaping Use: Never used  Substance and Sexual Activity  . Alcohol use: No  . Drug use: No  . Sexual activity: Not on file  Other Topics Concern  . Not on file  Social History Narrative   Pt  lives in [place] with spouse   Works as a Community education officer at [employer].   Social Determinants of Health   Financial Resource Strain:   . Difficulty of Paying Living Expenses: Not on file  Food Insecurity:   . Worried About Charity fundraiser in the Last Year: Not on file  . Ran Out of Food in the Last Year: Not on file  Transportation Needs:   . Lack of Transportation (Medical): Not on file  . Lack of Transportation (Non-Medical): Not on file  Physical Activity:   . Days of Exercise per Week: Not on file  . Minutes of Exercise per Session: Not on file  Stress:   . Feeling of Stress : Not on file  Social Connections:   . Frequency of Communication with Friends and Family: Not on file  . Frequency of Social Gatherings with Friends and Family: Not on file  . Attends Religious Services: Not on file  . Active Member of Clubs or Organizations: Not on file  . Attends Archivist Meetings: Not on file  . Marital Status: Not on file     Family History: The patient's family history includes Cancer in her paternal aunt and paternal uncle; Stroke (age of onset: 29) in her mother.  ROS:   Please see the history of present illness.    Denies blood in stool and melena.  She is disturbed by the social climate cannot contract.  This has been distracting to her.  Her grandchildren are biracial.  She has lost 30 pounds by decreasing caloric intake and physical activity.  All other systems reviewed and are negative.  EKGs/Labs/Other Studies Reviewed:    The following studies were reviewed today: No recent imaging  EKG:  EKG sinus bradycardia with PR interval 204 ms.  When compared with the June 2020 EKG, no changes occurred.  Recent Labs: No results found for requested labs within last 8760 hours.  Recent Lipid Panel No results found for: CHOL, TRIG, HDL, CHOLHDL, VLDL, LDLCALC, LDLDIRECT  Physical Exam:    VS:  BP 120/68   Pulse (!) 50   Ht $R'5\' 6"'Zg$  (1.676 m)   Wt 174 lb (78.9  kg)   SpO2 97%   BMI 28.08 kg/m     Wt Readings from Last 3 Encounters:  12/01/19 174 lb (78.9 kg)  09/07/18 176 lb 12.8 oz (80.2 kg)  06/18/17 181 lb 6.4 oz (82.3 kg)     GEN: Healthy-appearing mildly overweight.. No acute distress HEENT: Normal NECK: No JVD. LYMPHATICS: No lymphadenopathy CARDIAC:.  RRR without murmur, gallop, or edema. VASCULAR:  Normal Pulses. No bruits. RESPIRATORY:  Clear to auscultation without rales, wheezing or rhonchi  ABDOMEN: Soft, non-tender, non-distended, No pulsatile mass, MUSCULOSKELETAL: No deformity  SKIN: Warm and  dry NEUROLOGIC:  Alert and oriented x 3 PSYCHIATRIC:  Normal affect   ASSESSMENT:    1. Paroxysmal atrial fibrillation (HCC)   2. Chronic anticoagulation   3. Essential hypertension   4. Tachycardia-bradycardia syndrome ([place])   5. Educated about COVID-19 virus infection    PLAN:    In order of problems listed above:  1. Continue Tambocor 50 mg twice daily and Toprol-XL 25 mg/day. 2. Continue Coumadin anticoagulation and monitoring for evidence of bleeding. 3. Blood pressure on retake was 135/82 mmHg sitting and 134/70 mmHg astanding.  We will continue to follow.  If she has any recurrence of dizziness we need to decrease the diuretic regimen to 3 times per week or decrease Toprol-XL to 12.5 mg/day. 4. 1 dizzy episode that occurred shortly after arising from her car to walk into a store.  No recent monitoring.  No palpitations.  Episode lasted less than 5 seconds. 5. She is vaccinated and practicing mitigation.   Medication Adjustments/Labs and Tests Ordered: Current medicines are reviewed at length with the patient today.  Concerns regarding medicines are outlined above.  Orders Placed This Encounter  Procedures  . EKG 12-Lead   No orders of the defined types were placed in this encounter.   There are no Patient Instructions on file for this visit.   Signed, Sinclair Grooms, MD  12/01/2019 8:58 AM    [place]

## 2019-12-01 ENCOUNTER — Other Ambulatory Visit: Payer: Self-pay

## 2019-12-01 ENCOUNTER — Ambulatory Visit: Payer: Medicare Other | Admitting: Interventional Cardiology

## 2019-12-01 ENCOUNTER — Encounter: Payer: Self-pay | Admitting: Interventional Cardiology

## 2019-12-01 VITALS — BP 120/68 | HR 50 | Ht 66.0 in | Wt 174.0 lb

## 2019-12-01 DIAGNOSIS — I495 Sick sinus syndrome: Secondary | ICD-10-CM | POA: Diagnosis not present

## 2019-12-01 DIAGNOSIS — Z7901 Long term (current) use of anticoagulants: Secondary | ICD-10-CM | POA: Diagnosis not present

## 2019-12-01 DIAGNOSIS — I10 Essential (primary) hypertension: Secondary | ICD-10-CM | POA: Diagnosis not present

## 2019-12-01 DIAGNOSIS — I480 Paroxysmal atrial fibrillation: Secondary | ICD-10-CM | POA: Diagnosis not present

## 2019-12-01 DIAGNOSIS — Z7189 Other specified counseling: Secondary | ICD-10-CM

## 2019-12-01 NOTE — Patient Instructions (Signed)

## 2019-12-13 ENCOUNTER — Telehealth: Payer: Self-pay | Admitting: Interventional Cardiology

## 2019-12-13 DIAGNOSIS — I480 Paroxysmal atrial fibrillation: Secondary | ICD-10-CM

## 2019-12-13 NOTE — Telephone Encounter (Signed)
Left message for patient to call back  

## 2019-12-13 NOTE — Telephone Encounter (Signed)
New   STAT if patient feels like he/she is going to faint   1) Are you dizzy now? No about 15 mins ago  2) Do you feel faint or have you passed out? No   3) Do you have any other symptoms? no  4) Have you checked your HR and BP (record if available)? No she has not  She stated she seen Dr Tamala Julian on 16th and spoke to him about being light headed. She stated that she was told to call back and report if she was still feeling that way.  She stated that he might have to make some changes in the meds   Best number [phone]

## 2019-12-14 NOTE — Telephone Encounter (Signed)
Left message to call back  

## 2019-12-14 NOTE — Telephone Encounter (Signed)
Pt seen 9/16 and mentioned episode of dizziness.  Called yesterday to report she is still having episodes.  Last one was yesterday and lasted about 15 mins.  Denies other issues.  Episodes are brief and not associated with anything.  Dr. Tamala Julian mentioned decreasing diuretic or beta blocker if episodes continue.  Episodes are random and aren't occurring often.  Advised I will send to Dr. Tamala Julian for review.

## 2019-12-14 NOTE — Telephone Encounter (Signed)
Patient is returning call.  °

## 2019-12-15 NOTE — Telephone Encounter (Signed)
She needs a 30-day monitor

## 2019-12-16 ENCOUNTER — Encounter: Payer: Self-pay | Admitting: *Deleted

## 2019-12-16 NOTE — Addendum Note (Signed)
Addended by: Loren Racer on: 12/16/2019 11:55 AM   Modules accepted: Orders

## 2019-12-16 NOTE — Progress Notes (Signed)
Patient ID: Rebecca Sandoval, female   DOB: [birth year], 84 y.o.   MRN: [id number] Patient enrolled for [employer] to ship a 30 day cardiac event monitor to her home.

## 2019-12-16 NOTE — Telephone Encounter (Signed)
Left message to call back  

## 2019-12-16 NOTE — Telephone Encounter (Signed)
Spoke with pt and made her aware of recommendations. Pt agreeable to plan.  Went over monitor instructions and verified address.

## 2019-12-16 NOTE — Telephone Encounter (Signed)
Encounter not needed

## 2019-12-19 ENCOUNTER — Other Ambulatory Visit: Payer: Self-pay | Admitting: Interventional Cardiology

## 2019-12-20 ENCOUNTER — Ambulatory Visit (INDEPENDENT_AMBULATORY_CARE_PROVIDER_SITE_OTHER): Payer: Medicare Other | Admitting: *Deleted

## 2019-12-20 ENCOUNTER — Other Ambulatory Visit: Payer: Self-pay

## 2019-12-20 DIAGNOSIS — I4891 Unspecified atrial fibrillation: Secondary | ICD-10-CM | POA: Diagnosis not present

## 2019-12-20 DIAGNOSIS — I480 Paroxysmal atrial fibrillation: Secondary | ICD-10-CM

## 2019-12-20 DIAGNOSIS — Z5181 Encounter for therapeutic drug level monitoring: Secondary | ICD-10-CM

## 2019-12-20 LAB — POCT INR: INR: 2.9 (ref 2.0–3.0)

## 2019-12-20 NOTE — Patient Instructions (Addendum)
Description   Continue taking Warfarin 1.5 tablets daily except 2 tablets on Saturdays. Recheck INR in 6 weeks. Coumadin Clinic [phone]

## 2019-12-27 ENCOUNTER — Ambulatory Visit (INDEPENDENT_AMBULATORY_CARE_PROVIDER_SITE_OTHER): Payer: Medicare Other

## 2019-12-27 DIAGNOSIS — I480 Paroxysmal atrial fibrillation: Secondary | ICD-10-CM | POA: Diagnosis not present

## 2020-01-18 ENCOUNTER — Other Ambulatory Visit: Payer: Self-pay | Admitting: Interventional Cardiology

## 2020-01-31 ENCOUNTER — Other Ambulatory Visit: Payer: Self-pay

## 2020-01-31 ENCOUNTER — Ambulatory Visit (INDEPENDENT_AMBULATORY_CARE_PROVIDER_SITE_OTHER): Payer: Medicare Other | Admitting: *Deleted

## 2020-01-31 DIAGNOSIS — Z5181 Encounter for therapeutic drug level monitoring: Secondary | ICD-10-CM | POA: Diagnosis not present

## 2020-01-31 DIAGNOSIS — I4891 Unspecified atrial fibrillation: Secondary | ICD-10-CM | POA: Diagnosis not present

## 2020-01-31 DIAGNOSIS — I480 Paroxysmal atrial fibrillation: Secondary | ICD-10-CM | POA: Diagnosis not present

## 2020-01-31 LAB — POCT INR: INR: 2.1 (ref 2.0–3.0)

## 2020-01-31 NOTE — Patient Instructions (Addendum)
Description   Continue taking Warfarin 1.5 tablets daily except 2 tablets on Saturdays. Recheck INR in 7 weeks. Coumadin Clinic [phone]    Direct number to Coumadin Clinic [phone]

## 2020-02-01 ENCOUNTER — Telehealth: Payer: Self-pay | Admitting: Interventional Cardiology

## 2020-02-01 MED ORDER — METOPROLOL SUCCINATE ER 25 MG PO TB24: 12.5000 mg | ORAL_TABLET | Freq: Every day | ORAL | 3 refills | Status: DC

## 2020-02-01 NOTE — Telephone Encounter (Signed)
Belva Crome, MD  01/31/2020 1:17 PM EST     Let the patient know no significant abnormality noted. HR is slow ad I would recommend decreasing Toprol XL to 12.5 mg daily. A copy will be sent to Lujean Amel, MD   I spoke with patient and reviewed monitor results with her. Will send new prescription to [facility].  Monitor results sent to Dr Dorthy Cooler

## 2020-02-01 NOTE — Telephone Encounter (Signed)
Patient returning call for monitor results. 

## 2020-03-06 ENCOUNTER — Other Ambulatory Visit: Payer: Self-pay | Admitting: *Deleted

## 2020-03-06 MED ORDER — WARFARIN SODIUM 5 MG PO TABS: ORAL_TABLET | ORAL | 0 refills | Status: DC

## 2020-03-14 ENCOUNTER — Telehealth: Payer: Self-pay | Admitting: Interventional Cardiology

## 2020-03-14 NOTE — Telephone Encounter (Signed)
Spoke with pt and she states for 4 days now she has been very anxious and feels she is in AF.  States she can always tell when she is.  Has been lightheaded and gets flush when heart rate goes up.  HR has been in the 90s, usually in the 50s.  Denies any missed doses of Metoprolol or Flecainide.  Pt scheduled to be seen at the AF clinic tomorrow morning.  She understands that our office, nor the AF clinic will prescribe Ativan.  Directions to AF clinic provided along with code and phone number.  Pt appreciative for call.

## 2020-03-14 NOTE — Telephone Encounter (Signed)
    Pt is been having anxiety and it causing her Afib, she wanted to see Dr. Tamala Julian or APP, she said if she can't get an appt if Dr. Tamala Julian can prescribed LORazepam (ATIVAN) 0.5 MG tablet to help with with her anxiety. She said she only needs 30 days supply

## 2020-03-15 ENCOUNTER — Ambulatory Visit (HOSPITAL_COMMUNITY): Payer: Medicare Other | Admitting: Physician Assistant

## 2020-03-23 ENCOUNTER — Ambulatory Visit (INDEPENDENT_AMBULATORY_CARE_PROVIDER_SITE_OTHER): Payer: Medicare Other | Admitting: *Deleted

## 2020-03-23 ENCOUNTER — Other Ambulatory Visit: Payer: Self-pay

## 2020-03-23 DIAGNOSIS — Z5181 Encounter for therapeutic drug level monitoring: Secondary | ICD-10-CM | POA: Diagnosis not present

## 2020-03-23 DIAGNOSIS — I4891 Unspecified atrial fibrillation: Secondary | ICD-10-CM | POA: Diagnosis not present

## 2020-03-23 DIAGNOSIS — I480 Paroxysmal atrial fibrillation: Secondary | ICD-10-CM | POA: Diagnosis not present

## 2020-03-23 LAB — POCT INR: INR: 1.7 — AB (ref 2.0–3.0)

## 2020-03-23 NOTE — Patient Instructions (Signed)
Description   Today take 2 tablets then continue taking Warfarin 1.5 tablets daily except 2 tablets on Saturdays. Recheck INR in 4 weeks. Coumadin Clinic [phone]

## 2020-04-09 ENCOUNTER — Ambulatory Visit: Payer: Medicare Other | Admitting: Nurse Practitioner

## 2020-04-09 ENCOUNTER — Telehealth: Payer: Self-pay | Admitting: General Practice

## 2020-04-09 ENCOUNTER — Other Ambulatory Visit: Payer: Self-pay

## 2020-04-09 NOTE — Telephone Encounter (Signed)
Pt calling because she will take the last of her paxil today and that she has been cutting them in half to extend how long she had them but she fell her anxiety getting worse. She doesn't have an appointment until 06/08/20 with Dr Bryan Lemma, She is going to contact her old provider to see if there is anything they can do in the mean time. She doesn't have a certain provider she wants to see, she would like to be seen as soon as possible. Will Dr Gena Fray see her sooner?

## 2020-04-09 NOTE — Telephone Encounter (Signed)
Yes, I could see her.

## 2020-04-10 ENCOUNTER — Other Ambulatory Visit: Payer: Self-pay

## 2020-04-10 NOTE — Telephone Encounter (Signed)
Scheduled for tomorrow.

## 2020-04-10 NOTE — Telephone Encounter (Signed)
Lvm 04/10/20 AD for pt to call back and try to schedule with Dr Gena Fray 04/11/20

## 2020-04-11 ENCOUNTER — Encounter: Payer: Self-pay | Admitting: Family Medicine

## 2020-04-11 ENCOUNTER — Ambulatory Visit (INDEPENDENT_AMBULATORY_CARE_PROVIDER_SITE_OTHER): Payer: Medicare Other | Admitting: Family Medicine

## 2020-04-11 VITALS — BP 134/72 | HR 62 | Temp 97.0°F | Ht 66.0 in | Wt 174.0 lb

## 2020-04-11 DIAGNOSIS — M1A9XX0 Chronic gout, unspecified, without tophus (tophi): Secondary | ICD-10-CM | POA: Diagnosis not present

## 2020-04-11 DIAGNOSIS — K5792 Diverticulitis of intestine, part unspecified, without perforation or abscess without bleeding: Secondary | ICD-10-CM | POA: Insufficient documentation

## 2020-04-11 DIAGNOSIS — J301 Allergic rhinitis due to pollen: Secondary | ICD-10-CM | POA: Insufficient documentation

## 2020-04-11 DIAGNOSIS — M1811 Unilateral primary osteoarthritis of first carpometacarpal joint, right hand: Secondary | ICD-10-CM | POA: Diagnosis not present

## 2020-04-11 DIAGNOSIS — F419 Anxiety disorder, unspecified: Secondary | ICD-10-CM

## 2020-04-11 DIAGNOSIS — K219 Gastro-esophageal reflux disease without esophagitis: Secondary | ICD-10-CM | POA: Insufficient documentation

## 2020-04-11 DIAGNOSIS — I10 Essential (primary) hypertension: Secondary | ICD-10-CM | POA: Insufficient documentation

## 2020-04-11 DIAGNOSIS — I495 Sick sinus syndrome: Secondary | ICD-10-CM | POA: Insufficient documentation

## 2020-04-11 DIAGNOSIS — Z13820 Encounter for screening for osteoporosis: Secondary | ICD-10-CM

## 2020-04-11 DIAGNOSIS — Z853 Personal history of malignant neoplasm of breast: Secondary | ICD-10-CM

## 2020-04-11 DIAGNOSIS — I480 Paroxysmal atrial fibrillation: Secondary | ICD-10-CM

## 2020-04-11 DIAGNOSIS — K649 Unspecified hemorrhoids: Secondary | ICD-10-CM | POA: Insufficient documentation

## 2020-04-11 DIAGNOSIS — E669 Obesity, unspecified: Secondary | ICD-10-CM | POA: Insufficient documentation

## 2020-04-11 MED ORDER — PAROXETINE HCL 20 MG PO TABS: 20.0000 mg | ORAL_TABLET | ORAL | 2 refills | Status: DC

## 2020-04-11 MED ORDER — ALLOPURINOL 300 MG PO TABS: 300.0000 mg | ORAL_TABLET | Freq: Every day | ORAL | 0 refills | Status: DC

## 2020-04-11 NOTE — Progress Notes (Signed)
[facility] [address] [place] [address] Dept: [phone] Dept Fax: [phone]  New Patient Office Visit  Subjective:    Patient ID: Rebecca Sandoval, female    DOB: 12/23/1935, 85 y.o..   MRN: [id number]   Chief Complaint  Patient presents with  . Establish Care    New patient, refill on Paxil establish care.    History of Present Illness:  Patient is in today to establish care. Rebecca Sandoval currently feels in good health overall. She does note some pain at the base of her right thumb. Rebecca Sandoval feels this is exacerbated by her hobby of working with stained glass. She previously required a steroid injection in that area.  Rebecca Sandoval has been managed with Paxil for her GAD. She ran out of her prescription 9 days ago and has felt her anxiety symptoms to be increasing.  Rebecca Sandoval has a history of atrial fibrillation and tachycardia-bradycardia  Syndrome. She is managed with flecanide. She only occasionally has bouts of tachycardia. She is on chronic anticoagulation therapy with warfarin which is managed thorugh an anticoagulation clinic.  Past Medical History: Patient Active Problem List   Diagnosis Date Noted  . Allergic rhinitis due to pollen 04/11/2020  . Osteoarthritis of carpometacarpal [facility]) joint of right thumb 04/11/2020  . History of cancer of left breast 04/11/2020  . Hypertension 04/11/2020  . Obesity   . Diverticulitis   . Hemorrhoid   . GERD (gastroesophageal reflux disease)   . Stage 3 chronic kidney disease ([place]) 04/28/2017  . GAD (generalized anxiety disorder) 04/27/2017  . Chronic anticoagulation 05/23/2013  . Encounter for therapeutic drug monitoring 05/05/2013  . Paroxysmal atrial fibrillation ([place]) 09/23/2012  . Tachycardia-bradycardia syndrome ([place]) 09/23/2012  . Gout 08/11/2011    Past Surgical History:  Procedure Laterality Date  . ABDOMINAL HYSTERECTOMY  1975  . APPENDECTOMY     . bartholyn cyst  1973  . BILATERAL TEMPOROMANDIBULAR JOINT ARTHROPLASTY  1977  . BREAST LUMPECTOMY WITH RADIOACTIVE SEED LOCALIZATION Left 07/28/2014   Procedure: LEFT BREAST LUMPECTOMY WITH RADIOACTIVE SEED LOCALIZATION;  Surgeon: [name], MD;  Location: [facility];  Service: General;  Laterality: Left;  . diverticulitis  1986  . HERNIA REPAIR  1996   hiatal  . SHOULDER SURGERY     left 97, rigth 98  . TONSILLECTOMY      Family History  Problem Relation Age of Onset  . Stroke Mother 45  . Cancer Paternal Aunt   . Cancer Paternal Uncle     Outpatient Medications Prior to Visit  Medication Sig Dispense Refill  . acetaminophen (TYLENOL) 325 MG tablet Take 650 mg by mouth every 6 (six) hours as needed (pain).    . flecainide (TAMBOCOR) 50 MG tablet Take 1 tablet (50 mg total) by mouth 2 (two) times daily. 180 tablet 3  . fluticasone (FLONASE) 50 MCG/ACT nasal spray Place 1 spray into the nose as needed for allergies.     . metoprolol succinate (TOPROL XL) 25 MG 24 hr tablet Take 0.5 tablets (12.5 mg total) by mouth daily. 45 tablet 3  . ondansetron (ZOFRAN-ODT) 4 MG disintegrating tablet Take 4 mg by mouth every 6 (six) hours as needed for nausea/vomiting.    . triamterene-hydrochlorothiazide (MAXZIDE-25) 37.5-25 MG tablet Take 1 tablet by mouth daily. 90 tablet 3  . warfarin (COUMADIN) 5 MG tablet Take 1 and 1/2 tablets to 2 tablets daily or as directed by Anticoagulation Clinic. 12 tablet 0  .  allopurinol (ZYLOPRIM) 300 MG tablet Take 300 mg by mouth daily.    Marland Kitchen PARoxetine (PAXIL) 20 MG tablet Take 1 tablet (20 mg total) by mouth every morning. 90 tablet 2  . LORazepam (ATIVAN) 0.5 MG tablet Take 0.5 mg by mouth every 8 (eight) hours as needed for anxiety or sleep.  (Patient not taking: Reported on 04/11/2020)    . oxyCODONE-acetaminophen (ROXICET) 5-325 MG per tablet Take 1-2 tablets by mouth every 4 (four) hours as needed for severe pain. (Patient not taking: Reported on 04/11/2020)  30 tablet 0   No facility-administered medications prior to visit.    Allergies  Allergen Reactions  . Prednisone     "pt stated this puts her in an uncomfortable zone"  . Tetanus Toxoids Hives   Social History   Socioeconomic History  . Marital status: Married    Spouse name: Mariea Clonts  . Number of children: 2  . Years of education: Not on file  . Highest education level: GED or equivalent  Occupational History    Comment: Retired  Tobacco Use  . Smoking status: Former Research scientist (life sciences)  . Smokeless tobacco: Never Used  Vaping Use  . Vaping Use: Never used  Substance and Sexual Activity  . Alcohol use: No  . Drug use: No  . Sexual activity: Not on file  Other Topics Concern  . Not on file  Social History Narrative   Pt lives in [place] with spouse   Social Determinants of Health   Financial Resource Strain: Not on file  Food Insecurity: Not on file  Transportation Needs: Not on file  Physical Activity: Not on file  Stress: Not on file  Social Connections: Not on file  Intimate Partner Violence: Not on file      Objective:    Today's Vitals   04/11/20 1355  BP: 134/72  Pulse: 62  Temp: (!) 97 F (36.1 C)  TempSrc: Temporal  SpO2: 96%  Weight: 174 lb (78.9 kg)  Height: $Remove'5\' 6"'BXzcOQm$  (1.676 m)   Body mass index is 28.08 kg/m.  General: Well developed, well nourished. No acute distress. Lungs: Clear to auscultation bilaterally. CV: RRR without murmurs or rubs. Pulses 2+ bilaterally. Extremities: Full ROM of right thumb. No joint swelling or tenderness to palpation, though minor joint bony hypertrophy. 1+ bilateral lower leg edema noted. Psych: Alert and oriented x3. Normal mood and affect.  Health Maintenance Due  Topic Date Due  . TETANUS/TDAP  Never done  . PNA vac Low Risk Adult (2 of 2 - PPSV23) 03/25/2015     Assessment & Plan:   1. Generalized Anxiety Disorder Rebecca Sandoval has been well managed on Paxil for GAD. I will renew her Paxil  - PARoxetine  (PAXIL) 20 MG tablet; Take 1 tablet (20 mg total) by mouth every morning.  Dispense: 90 tablet; Refill: 2  2. Paroxysmal atrial fibrillation ([place]) Rebecca Sandoval will continue to follow with Dr. Tamala Julian yearly. She will continue to utilize the anticoagulation clinic to manage her coumadin.  3. Chronic gout without tophus, unspecified cause, unspecified site Prior history of elevated uric acid. Continue allopurinol.  - allopurinol (ZYLOPRIM) 300 MG tablet; Take 1 tablet (300 mg total) by mouth daily.  Dispense: 90 tablet; Refill: 0  4. Osteoarthritis of carpometacarpal (CMC) joint of right thumb, unspecified osteoarthritis type Rebecca Sandoval has symptoms and exam consistent with 1st CMC joint osteoarthritis. I recommend she avoid excessive hand use when pain is flared. We discussed icing of joint for acute flares and  use of hot soaks with ROM for more chronic improvement. She has a splint at home that she could use to put her thumb at rest. If not improving, consider steroid joint injection.  5. Screening for osteoporosis Rebecca Sandoval's overall health is good and would warrant continued screenign for certain conditions. She notes she has not had a Dexa scan in many years.  - DG Bone Density; Future  6. History of cancer of left breast In light of Rebecca Sandoval's prior history of breast cancer and her overall good health, recommend a mammogram for screening. Due to the presence of the radioactive seed she notes she has received diagnostic mammograms, as this often requires further imaging.  - MM Digital Diagnostic Bilat; Future   [name], MD

## 2020-04-14 ENCOUNTER — Other Ambulatory Visit: Payer: Self-pay | Admitting: Interventional Cardiology

## 2020-04-20 ENCOUNTER — Telehealth: Payer: Self-pay | Admitting: *Deleted

## 2020-04-20 NOTE — Telephone Encounter (Signed)
Called pt since she missed today's appt; spoke with pt and she states she has a sore throat and missed the appt today. Advised that she needs to get tested for COVID since that is a symptom of COVID. She stated she has the free COVID ones she got in the mail and will use one tonight. Advised that would be great and to call us back with results so we will know when to reschedule appt and she verbalized understanding.   Advised the pt that I will follow up with her on Monday, 04/23/2020, to know when to reschedule her.

## 2020-04-24 NOTE — Telephone Encounter (Signed)
Called pt since she was not feeling well last week and no showed to appt. She states she is feeling much better and those symptoms that she had have been gone and she did not get any testing done since it did not last long. Pt denies cough, fever, running nose, sore throat, and other related URI symptoms. Advised that we want to be sure that she is symptoms free before coming in the office and she stated she is fine. She will be available for appt Friday and states her diet has not been good as she has been eating soups. Appt set for Friday at 2pm.

## 2020-04-27 ENCOUNTER — Other Ambulatory Visit: Payer: Self-pay

## 2020-04-27 ENCOUNTER — Ambulatory Visit (INDEPENDENT_AMBULATORY_CARE_PROVIDER_SITE_OTHER): Payer: Medicare Other | Admitting: *Deleted

## 2020-04-27 DIAGNOSIS — I4891 Unspecified atrial fibrillation: Secondary | ICD-10-CM | POA: Diagnosis not present

## 2020-04-27 DIAGNOSIS — I480 Paroxysmal atrial fibrillation: Secondary | ICD-10-CM | POA: Diagnosis not present

## 2020-04-27 DIAGNOSIS — Z5181 Encounter for therapeutic drug level monitoring: Secondary | ICD-10-CM

## 2020-04-27 LAB — POCT INR: INR: 1.8 — AB (ref 2.0–3.0)

## 2020-04-27 NOTE — Patient Instructions (Addendum)
Description    Take 2 tablets of Warfarin today and then start taking 1.5 tablet daily except for 2 tablets on Tuesday and Saturday. Recheck INR in 3 weeks. Coumadin Clinic [phone].

## 2020-05-18 ENCOUNTER — Other Ambulatory Visit: Payer: Self-pay

## 2020-05-18 ENCOUNTER — Ambulatory Visit (INDEPENDENT_AMBULATORY_CARE_PROVIDER_SITE_OTHER): Payer: Medicare Other | Admitting: *Deleted

## 2020-05-18 DIAGNOSIS — Z5181 Encounter for therapeutic drug level monitoring: Secondary | ICD-10-CM

## 2020-05-18 DIAGNOSIS — I480 Paroxysmal atrial fibrillation: Secondary | ICD-10-CM | POA: Diagnosis not present

## 2020-05-18 DIAGNOSIS — I4891 Unspecified atrial fibrillation: Secondary | ICD-10-CM | POA: Diagnosis not present

## 2020-05-18 LAB — POCT INR: INR: 1.6 — AB (ref 2.0–3.0)

## 2020-05-18 NOTE — Patient Instructions (Signed)
Description    Take 2.5 tablets of Warfarin today and then start taking 1.5 tablet daily except for 2 tablets on Tuesday and Saturday. Recheck INR in 2 weeks. Coumadin Clinic [phone].

## 2020-06-01 ENCOUNTER — Ambulatory Visit (INDEPENDENT_AMBULATORY_CARE_PROVIDER_SITE_OTHER): Payer: Medicare Other | Admitting: *Deleted

## 2020-06-01 ENCOUNTER — Other Ambulatory Visit: Payer: Self-pay

## 2020-06-01 DIAGNOSIS — I480 Paroxysmal atrial fibrillation: Secondary | ICD-10-CM

## 2020-06-01 DIAGNOSIS — Z5181 Encounter for therapeutic drug level monitoring: Secondary | ICD-10-CM | POA: Diagnosis not present

## 2020-06-01 DIAGNOSIS — I4891 Unspecified atrial fibrillation: Secondary | ICD-10-CM

## 2020-06-01 LAB — POCT INR: INR: 2.2 (ref 2.0–3.0)

## 2020-06-01 NOTE — Patient Instructions (Signed)
Description   Continue taking 1.5 tablet daily except for 2 tablets on Tuesday and Saturday. Recheck INR in 3 weeks. Coumadin Clinic [phone].

## 2020-06-08 ENCOUNTER — Ambulatory Visit: Payer: Medicare Other | Admitting: Family Medicine

## 2020-06-22 ENCOUNTER — Other Ambulatory Visit: Payer: Self-pay

## 2020-06-22 ENCOUNTER — Ambulatory Visit (INDEPENDENT_AMBULATORY_CARE_PROVIDER_SITE_OTHER): Payer: Medicare Other | Admitting: *Deleted

## 2020-06-22 ENCOUNTER — Encounter (INDEPENDENT_AMBULATORY_CARE_PROVIDER_SITE_OTHER): Payer: Self-pay

## 2020-06-22 DIAGNOSIS — I4891 Unspecified atrial fibrillation: Secondary | ICD-10-CM

## 2020-06-22 DIAGNOSIS — Z5181 Encounter for therapeutic drug level monitoring: Secondary | ICD-10-CM

## 2020-06-22 DIAGNOSIS — I480 Paroxysmal atrial fibrillation: Secondary | ICD-10-CM | POA: Diagnosis not present

## 2020-06-22 LAB — POCT INR: INR: 1.9 — AB (ref 2.0–3.0)

## 2020-06-22 NOTE — Patient Instructions (Addendum)
Description    Take 2.5 tablets today and then continue to take warfarin 1.5 tablets daily except for 2 tablets on Tuesdays and Saturdays. Recheck INR in 3 weeks. Be consistent with your greens. Coumadin Clinic [phone].

## 2020-07-08 ENCOUNTER — Other Ambulatory Visit: Payer: Self-pay | Admitting: Interventional Cardiology

## 2020-07-09 NOTE — Telephone Encounter (Signed)
Prescription refill request received for warfarin [place]: 12/01/2019, [place] Next INR check: 4/29 Warfarin tablet strength: $RemoveBeforeDE'5mg'[id number]$ 

## 2020-07-10 ENCOUNTER — Encounter: Payer: Self-pay | Admitting: Family Medicine

## 2020-07-10 ENCOUNTER — Other Ambulatory Visit: Payer: Self-pay

## 2020-07-10 ENCOUNTER — Ambulatory Visit (INDEPENDENT_AMBULATORY_CARE_PROVIDER_SITE_OTHER): Payer: Medicare Other | Admitting: Family Medicine

## 2020-07-10 VITALS — BP 122/66 | HR 55 | Temp 98.0°F | Ht 66.0 in | Wt 177.0 lb

## 2020-07-10 DIAGNOSIS — M1811 Unilateral primary osteoarthritis of first carpometacarpal joint, right hand: Secondary | ICD-10-CM

## 2020-07-10 DIAGNOSIS — I10 Essential (primary) hypertension: Secondary | ICD-10-CM | POA: Diagnosis not present

## 2020-07-10 DIAGNOSIS — M720 Palmar fascial fibromatosis [Dupuytren]: Secondary | ICD-10-CM | POA: Diagnosis not present

## 2020-07-10 DIAGNOSIS — I480 Paroxysmal atrial fibrillation: Secondary | ICD-10-CM | POA: Diagnosis not present

## 2020-07-10 DIAGNOSIS — F411 Generalized anxiety disorder: Secondary | ICD-10-CM

## 2020-07-10 NOTE — Progress Notes (Signed)
[facility] [address] [place] [address] Dept: [phone] Dept Fax: [phone]  Chronic Care Office Visit  Subjective:    Patient ID: Rebecca Sandoval, female    DOB: 16-Apr-1935, 85 y.o..   MRN: [id number]  Chief Complaint  Patient presents with  . Follow-up    F/u anxiety.  No concerns.      History of Present Illness:  Patient is in today for reassessment of chronic medical issues.  Rebecca Sandoval notes she has been doing well over the past 3 months. She is taking her blood pressure medication without complaints. She feels her atrial fib is well controlled on the flecainide. She continues to be seen at the anticoagulation clinic. Her last INR was slightly low, as she had recently increased her consumption of Romaine lettuce.  Rebecca Sandoval notes her anxiety is doing overall well. She talks about the loss of her daughter some years ago and how she has come to terms with this. She notes only one episode in the past month where she had an acute anxiety attack. She states, if she had had Xanax, she likely would have taken one. However, she prefers to operate without this and feels good that she got through that day without resorting to Xanax.  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 She notes she had a cortisone injection in the joint some years ago. She feels she may be approaching needing this again. She also does note some thickening of the skin in the left palm .She has note noted any associated pain or limited ROM of the fingers.  Past Medical History: Patient Active Problem List   Diagnosis Date Noted  . Allergic rhinitis due to pollen 04/11/2020  . Osteoarthritis of carpometacarpal [facility]) joint of right thumb 04/11/2020  . History of cancer of left breast 04/11/2020  . Essential hypertension 04/11/2020  . Obesity   . Diverticulitis   . Hemorrhoid   .  GERD (gastroesophageal reflux disease)   . Stage 3 chronic kidney disease ([place]) 04/28/2017  . GAD (generalized anxiety disorder) 04/27/2017  . Chronic anticoagulation 05/23/2013  . Encounter for therapeutic drug monitoring 05/05/2013  . Paroxysmal atrial fibrillation ([place]) 09/23/2012  . Tachycardia-bradycardia syndrome ([place]) 09/23/2012  . Gout 08/11/2011   Past Surgical History:  Procedure Laterality Date  . ABDOMINAL HYSTERECTOMY  1975  . APPENDECTOMY    . bartholyn cyst  1973  . BILATERAL TEMPOROMANDIBULAR JOINT ARTHROPLASTY  1977  . BREAST LUMPECTOMY WITH RADIOACTIVE SEED LOCALIZATION Left 07/28/2014   Procedure: LEFT BREAST LUMPECTOMY WITH RADIOACTIVE SEED LOCALIZATION;  Surgeon: [name], MD;  Location: [facility];  Service: General;  Laterality: Left;  . diverticulitis  1986  . HERNIA REPAIR  1996   hiatal  . SHOULDER SURGERY     left 97, rigth 98  . TONSILLECTOMY     Family History  Problem Relation Age of Onset  . Stroke Mother 45  . Cancer Paternal Aunt   . Cancer Paternal Uncle    Outpatient Medications Prior to Visit  Medication Sig Dispense Refill  . acetaminophen (TYLENOL) 325 MG tablet Take 650 mg by mouth every 6 (six) hours as needed (pain).    Marland Kitchen allopurinol (ZYLOPRIM) 300 MG tablet Take 1 tablet (300 mg total) by mouth daily. 90 tablet 0  . flecainide (TAMBOCOR) 50 MG tablet Take 1 tablet (50 mg total) by mouth 2 (two) times  daily. 180 tablet 3  . fluticasone (FLONASE) 50 MCG/ACT nasal spray Place 1 spray into the nose as needed for allergies.     . metoprolol succinate (TOPROL XL) 25 MG 24 hr tablet Take 0.5 tablets (12.5 mg total) by mouth daily. 45 tablet 3  . ondansetron (ZOFRAN-ODT) 4 MG disintegrating tablet Take 4 mg by mouth every 6 (six) hours as needed for nausea/vomiting.    Marland Kitchen PARoxetine (PAXIL) 20 MG tablet Take 1 tablet (20 mg total) by mouth every morning. 90 tablet 2  . triamterene-hydrochlorothiazide (MAXZIDE-25) 37.5-25 MG tablet Take 1 tablet by  mouth daily. 90 tablet 3  . warfarin (COUMADIN) 5 MG tablet TAKE 1 AND 1/2 TO 2 TABLETS BY MOUTH DAILY AS DIRECTED  BY THE COUMADIN CLINIC 175 tablet 0   No facility-administered medications prior to visit.   Allergies  Allergen Reactions  . Prednisone     "pt stated this puts her in an uncomfortable zone"  . Tetanus Toxoids Hives    Objective:   Today's Vitals   07/10/20 1344  BP: 122/66  Pulse: (!) 55  Temp: 98 F (36.7 C)  TempSrc: Temporal  SpO2: 93%  Weight: 177 lb (80.3 kg)  Height: $Remove'5\' 6"'PBnNMMm$  (1.676 m)   Body mass index is 28.57 kg/m.   General: Well developed, well nourished. No acute distress. Extremities: No obvious swelling over the [facility]st [facility] joint. Good stability.  Skin: Warm and dry. There is thickening of the skin in a horizontal band across the hypothenar side of the left palm. FROM of the fingers. Psych: Alert and oriented x3. Normal mood and affect.  Health Maintenance Due  Topic Date Due  . PNA vac Low Risk Adult (2 of 2 - PPSV23) 03/25/2015     Assessment & Plan:  1. Essential hypertension Blood pressure is at goal. Continue metorpolol and Maxzide. At the next visit, I will plan to check a UA and BMP to assess renal function.  2. Paroxysmal atrial fibrillation (HCC) Stable on flecainide and metoprolol. Continue warfarin management via Anticoagulation Clinic.  3. Osteoarthritis of carpometacarpal (CMC) joint of right thumb, unspecified osteoarthritis type Patient will address with orthopedist.  4. GAD (generalized anxiety disorder) Stable on Paxil. I support her working to control anxiety episodes without meds as long as possible.  5. Left Palmar fibromatosis We will monitor the issue with the left palm. No need to intervene at the current time.  [name], MD

## 2020-07-13 ENCOUNTER — Other Ambulatory Visit: Payer: Self-pay

## 2020-07-13 ENCOUNTER — Ambulatory Visit (INDEPENDENT_AMBULATORY_CARE_PROVIDER_SITE_OTHER): Payer: Medicare Other | Admitting: *Deleted

## 2020-07-13 DIAGNOSIS — Z5181 Encounter for therapeutic drug level monitoring: Secondary | ICD-10-CM | POA: Diagnosis not present

## 2020-07-13 DIAGNOSIS — I4891 Unspecified atrial fibrillation: Secondary | ICD-10-CM

## 2020-07-13 DIAGNOSIS — I480 Paroxysmal atrial fibrillation: Secondary | ICD-10-CM

## 2020-07-13 LAB — POCT INR: INR: 2.4 (ref 2.0–3.0)

## 2020-07-13 NOTE — Patient Instructions (Addendum)
Description   Continue taking warfarin 1.5 tablets daily except for 2 tablets on Tuesdays and Saturdays. Recheck INR in 4 weeks. Continue to be consistent with your greens. Coumadin Clinic [phone].

## 2020-08-03 ENCOUNTER — Telehealth: Payer: Self-pay | Admitting: Family Medicine

## 2020-08-03 NOTE — Telephone Encounter (Signed)
Left message for patient to schedule Annual Wellness Visit.  Please schedule with Nurse Health Advisor Martha Stanley, RN at [facility]  °

## 2020-09-04 ENCOUNTER — Other Ambulatory Visit: Payer: Medicare Other

## 2020-09-07 ENCOUNTER — Other Ambulatory Visit: Payer: Self-pay | Admitting: Interventional Cardiology

## 2020-09-10 ENCOUNTER — Other Ambulatory Visit: Payer: Self-pay | Admitting: Family Medicine

## 2020-09-10 DIAGNOSIS — Z853 Personal history of malignant neoplasm of breast: Secondary | ICD-10-CM

## 2020-09-10 DIAGNOSIS — Z13820 Encounter for screening for osteoporosis: Secondary | ICD-10-CM

## 2020-09-10 NOTE — Telephone Encounter (Addendum)
Pt is overdue for an appt; last seen in April and was due in May & has no pending appt.   Called pt using both numbers listed and no answer & states invalid & the other rings but then goes busy. Will try back later.

## 2020-09-10 NOTE — Telephone Encounter (Signed)
Pt called scheduling dept and has an appt on 09/14/20

## 2020-09-14 ENCOUNTER — Ambulatory Visit (INDEPENDENT_AMBULATORY_CARE_PROVIDER_SITE_OTHER): Payer: Medicare Other

## 2020-09-14 ENCOUNTER — Other Ambulatory Visit: Payer: Self-pay

## 2020-09-14 DIAGNOSIS — I4891 Unspecified atrial fibrillation: Secondary | ICD-10-CM

## 2020-09-14 DIAGNOSIS — I480 Paroxysmal atrial fibrillation: Secondary | ICD-10-CM | POA: Diagnosis not present

## 2020-09-14 DIAGNOSIS — Z5181 Encounter for therapeutic drug level monitoring: Secondary | ICD-10-CM

## 2020-09-14 LAB — POCT INR: INR: 2.2 (ref 2.0–3.0)

## 2020-09-14 NOTE — Patient Instructions (Signed)
Description   Continue taking warfarin 1.5 tablets daily except for 2 tablets on Tuesdays and Saturdays. Recheck INR in 5 weeks. Continue to be consistent with your greens. Coumadin Clinic [phone].

## 2020-10-02 ENCOUNTER — Telehealth: Payer: Self-pay

## 2020-10-02 NOTE — Telephone Encounter (Signed)
PT IS SCHEDULED TO SEE DR RUDD ON 10/09/20. SHE ONLY WANTS TO SEE DR RUDD. SHE HAS GAINED 9LBS IN 4 DAYS.   SHOULD PATIENT WAIT THIS LONG  TO BE SEEN?  I DID TELL HER I WOULD SEE IF DR RUDD COULD WORK HER IN EARLIER BUT HE IS NOT IN UNTIL Thursday.  THANKS FOR YOU HELP

## 2020-10-02 NOTE — Telephone Encounter (Signed)
Patient should be seen at ED or urgent care and not wait a full week to be seen by Dr. Gena Fray and there is no guarantee that he will be able to see her when he returns due to playing catch up from being out. She should go to urgent care or ED.

## 2020-10-03 NOTE — Telephone Encounter (Signed)
Called pt 10/04/86 @ 7:57a, lvm that Dr on call advised to go to [facility] or ED to be seen. Do not wait for appt and there is no guarantee Dr Gena Fray would be able to work her in tomorrow.

## 2020-10-09 ENCOUNTER — Ambulatory Visit: Payer: Medicare Other | Admitting: Family Medicine

## 2020-10-19 ENCOUNTER — Other Ambulatory Visit: Payer: Self-pay

## 2020-10-19 ENCOUNTER — Ambulatory Visit (INDEPENDENT_AMBULATORY_CARE_PROVIDER_SITE_OTHER): Payer: Medicare Other

## 2020-10-19 DIAGNOSIS — I4891 Unspecified atrial fibrillation: Secondary | ICD-10-CM | POA: Diagnosis not present

## 2020-10-19 DIAGNOSIS — Z5181 Encounter for therapeutic drug level monitoring: Secondary | ICD-10-CM | POA: Diagnosis not present

## 2020-10-19 DIAGNOSIS — I480 Paroxysmal atrial fibrillation: Secondary | ICD-10-CM | POA: Diagnosis not present

## 2020-10-19 LAB — POCT INR: INR: 2.8 (ref 2.0–3.0)

## 2020-10-19 NOTE — Patient Instructions (Signed)
Description   Continue taking warfarin 1.5 tablets daily except for 2 tablets on Tuesdays and Saturdays. Recheck INR in 6 weeks. Continue to be consistent with your greens. Coumadin Clinic [phone].

## 2020-10-23 ENCOUNTER — Other Ambulatory Visit: Payer: Self-pay

## 2020-10-23 ENCOUNTER — Ambulatory Visit (INDEPENDENT_AMBULATORY_CARE_PROVIDER_SITE_OTHER): Payer: Medicare Other | Admitting: Family Medicine

## 2020-10-23 ENCOUNTER — Encounter: Payer: Self-pay | Admitting: Family Medicine

## 2020-10-23 VITALS — BP 118/56 | HR 53 | Temp 98.3°F | Ht 66.0 in | Wt 179.8 lb

## 2020-10-23 DIAGNOSIS — I10 Essential (primary) hypertension: Secondary | ICD-10-CM | POA: Diagnosis not present

## 2020-10-23 DIAGNOSIS — N183 Chronic kidney disease, stage 3 unspecified: Secondary | ICD-10-CM | POA: Diagnosis not present

## 2020-10-23 DIAGNOSIS — I480 Paroxysmal atrial fibrillation: Secondary | ICD-10-CM

## 2020-10-23 DIAGNOSIS — R200 Anesthesia of skin: Secondary | ICD-10-CM | POA: Diagnosis not present

## 2020-10-23 DIAGNOSIS — Z13220 Encounter for screening for lipoid disorders: Secondary | ICD-10-CM

## 2020-10-23 NOTE — Progress Notes (Signed)
[facility] [place] [address] [place] [address] Dept: [phone] Dept Fax: [phone]  Chronic Care Office Visit  Subjective:    Patient ID: Rebecca Sandoval, female    DOB: 25-Mar-1935, 85 y.o..   MRN: [id number]  Chief Complaint  Patient presents with   Follow-up    3 mo f/u. Pt c/o both feet falling asleep with mobility x4 weeks    History of Present Illness:  Patient is in today for reassessment of chronic medical issues.  [name] has a history of hypertension, managed with Maxzide. She also has a. fib and tachy-brady syndrome. She is on metoprolol and flecainide for regulation of her heart and warfarin for anticoagulation, managed by the anticoag clinic. She has Stage 3 CKD. She feels her heart is doing well currently.  [name] does notice a recent issue (past 2-4 weeks) of a sensation that her toes are asleep. She notes they are not completely numb and her feet have not necessarily felt cold.  Past Medical History: Patient Active Problem List   Diagnosis Date Noted   Left palmar fibromatosis 07/10/2020   Allergic rhinitis due to pollen 04/11/2020   Osteoarthritis of carpometacarpal [facility]) joint of right thumb 04/11/2020   History of cancer of left breast 04/11/2020   Essential hypertension 04/11/2020   Obesity    Diverticulitis    Hemorrhoid    GERD (gastroesophageal reflux disease)    Stage 3 chronic kidney disease ([place]) 04/28/2017   GAD (generalized anxiety disorder) 04/27/2017   Chronic anticoagulation 05/23/2013   Encounter for therapeutic drug monitoring 05/05/2013   Paroxysmal atrial fibrillation ([place]) 09/23/2012   Tachycardia-bradycardia syndrome ([place]) 09/23/2012   Gout 08/11/2011   Past Surgical History:  Procedure Laterality Date   ABDOMINAL HYSTERECTOMY  1975   APPENDECTOMY     bartholyn cyst  1973   BILATERAL TEMPOROMANDIBULAR JOINT ARTHROPLASTY  1977   BREAST LUMPECTOMY WITH  RADIOACTIVE SEED LOCALIZATION Left 07/28/2014   Procedure: LEFT BREAST LUMPECTOMY WITH RADIOACTIVE SEED LOCALIZATION;  Surgeon: [name], MD;  Location: [facility];  Service: General;  Laterality: Left;   diverticulitis  [place]   hiatal   SHOULDER SURGERY     left 74, rigth 63   TONSILLECTOMY     Family History  Problem Relation Age of Onset   Stroke Mother 28   Cancer Paternal Aunt    Cancer Paternal Uncle    Outpatient Medications Prior to Visit  Medication Sig Dispense Refill   acetaminophen (TYLENOL) 325 MG tablet Take 650 mg by mouth every 6 (six) hours as needed (pain).     flecainide (TAMBOCOR) 50 MG tablet Take 1 tablet (50 mg total) by mouth 2 (two) times daily. 180 tablet 3   fluticasone (FLONASE) 50 MCG/ACT nasal spray Place 1 spray into the nose as needed for allergies.      metoprolol succinate (TOPROL XL) 25 MG 24 hr tablet Take 0.5 tablets (12.5 mg total) by mouth daily. 45 tablet 3   ondansetron (ZOFRAN-ODT) 4 MG disintegrating tablet Take 4 mg by mouth every 6 (six) hours as needed for nausea/vomiting.     PARoxetine (PAXIL) 20 MG tablet Take 1 tablet (20 mg total) by mouth every morning. 90 tablet 2   triamterene-hydrochlorothiazide (MAXZIDE-25) 37.5-25 MG tablet Take 1 tablet by mouth daily. 90 tablet 3   warfarin (COUMADIN) 5 MG tablet TAKE 1 AND 1/2 TO 2 TABLETS BY MOUTH DAILY AS DIRECTED  BY  THE COUMADIN CLINIC 160 tablet 1   allopurinol (ZYLOPRIM) 300 MG tablet Take 1 tablet (300 mg total) by mouth daily. 90 tablet 0   No facility-administered medications prior to visit.   Allergies  Allergen Reactions   Prednisone     "pt stated this puts her in an uncomfortable zone"   Tetanus Toxoids Hives   Objective:   Today's Vitals   10/23/20 1504  BP: (!) 118/56  Pulse: (!) 53  Temp: 98.3 F (36.8 C)  TempSrc: Oral  SpO2: 97%  Weight: 179 lb 12.8 oz (81.6 kg)  Height: $Remove'5\' 6"'HERFJDH$  (1.676 m)   Body mass index is 29.02 kg/m.   General: Well  developed, well nourished. No acute distress. Feet: Skin is intact. The feet are warm Cap refill of toes is about 2-3 secs. There are a few patches of spider varicosities. 5.07   monofilament testing is normal bilaterally. Psych: Alert and oriented. Normal mood and affect.  Health Maintenance Due  Topic Date Due   Zoster Vaccines- Shingrix (1 of 2) Never done   PNA vac Low Risk Adult (2 of 2 - PPSV23) 03/25/2015   COVID-19 Vaccine (4 - Booster for Pfizer series) 04/17/2020   INFLUENZA VACCINE  10/15/2020     Assessment & Plan:   1. Essential hypertension Blood pressure is at goal today. We will continue Maxzide.  2. Paroxysmal atrial fibrillation (HCC) Stable on flecainide and metoprolol. Review of meds raises potential drug-drug interaction between Paxil and flecainide as paroxetine is a strong inhibitor of CYP2D6. However, she is not showing any signs of flecainide toxicity at present.  3. Stage 3 chronic kidney disease, unspecified whether stage 3a or 3b CKD ([place]) Due for reassessment of renal status.  - Basic metabolic panel; Future - Urinalysis, Routine w reflex microscopic; Future  4. Numbness of toes The etiology of this is uncertain. She does not have evidence of current disease or medications that would cause this. As this seems to be more sensory than circulation, I will refer her to neurology.  - Ambulatory referral to Neurology  5. Screening for lipid disorders  - Lipid panel; Future  [name], MD

## 2020-10-24 ENCOUNTER — Encounter: Payer: Self-pay | Admitting: Family Medicine

## 2020-10-24 ENCOUNTER — Other Ambulatory Visit (INDEPENDENT_AMBULATORY_CARE_PROVIDER_SITE_OTHER): Payer: Medicare Other

## 2020-10-24 DIAGNOSIS — Z13220 Encounter for screening for lipoid disorders: Secondary | ICD-10-CM | POA: Diagnosis not present

## 2020-10-24 DIAGNOSIS — N183 Chronic kidney disease, stage 3 unspecified: Secondary | ICD-10-CM

## 2020-10-24 DIAGNOSIS — I10 Essential (primary) hypertension: Secondary | ICD-10-CM | POA: Diagnosis not present

## 2020-10-24 DIAGNOSIS — R8281 Pyuria: Secondary | ICD-10-CM

## 2020-10-24 LAB — BASIC METABOLIC PANEL
BUN: 22 mg/dL (ref 6–23)
CO2: 24 mEq/L (ref 19–32)
Calcium: 9 mg/dL (ref 8.4–10.5)
Chloride: 106 mEq/L (ref 96–112)
Creatinine, Ser: 1.46 mg/dL — ABNORMAL HIGH (ref 0.40–1.20)
GFR: 33.07 mL/min — ABNORMAL LOW (ref >60.00)
Glucose, Bld: 101 mg/dL — ABNORMAL HIGH (ref 70–99)
Potassium: 4.8 mEq/L (ref 3.5–5.1)
Sodium: 140 mEq/L (ref 135–145)

## 2020-10-24 LAB — LIPID PANEL
Cholesterol: 192 mg/dL (ref 0–200)
HDL: 40.5 mg/dL (ref >39.00)
NonHDL: 151.6
Total CHOL/HDL Ratio: 5
Triglycerides: 235 mg/dL — ABNORMAL HIGH (ref 0.0–149.0)
VLDL: 47 mg/dL — ABNORMAL HIGH (ref 0.0–40.0)

## 2020-10-24 LAB — URINALYSIS, ROUTINE W REFLEX MICROSCOPIC
Bilirubin Urine: NEGATIVE
Hgb urine dipstick: NEGATIVE
Ketones, ur: NEGATIVE
Nitrite: POSITIVE — AB
RBC / HPF: NONE SEEN (ref 0–?)
Specific Gravity, Urine: 1.02 (ref 1.000–1.030)
Total Protein, Urine: NEGATIVE
Urine Glucose: NEGATIVE
Urobilinogen, UA: 1 (ref 0.0–1.0)
pH: 6.5 (ref 5.0–8.0)

## 2020-10-24 LAB — LDL CHOLESTEROL, DIRECT: Direct LDL: 115 mg/dL

## 2020-10-24 NOTE — Addendum Note (Signed)
Addended by: [name] on: 10/24/2020 06:15 PM   Modules accepted: Orders

## 2020-10-24 NOTE — Progress Notes (Signed)
Per the orders of Dr.Rudd pt is here for labs, pt tolerated draw well, pt was able to provide sufficient amount of urine.

## 2020-11-05 ENCOUNTER — Encounter: Payer: Self-pay | Admitting: Family Medicine

## 2020-11-10 ENCOUNTER — Other Ambulatory Visit: Payer: Self-pay | Admitting: Interventional Cardiology

## 2020-11-12 ENCOUNTER — Telehealth: Payer: Self-pay | Admitting: Family Medicine

## 2020-11-12 DIAGNOSIS — R300 Dysuria: Secondary | ICD-10-CM

## 2020-11-12 NOTE — Telephone Encounter (Signed)
error 

## 2020-11-12 NOTE — Telephone Encounter (Signed)
Pt would like a cb concerning her most recent lab results. Please advise at [phone].

## 2020-11-13 ENCOUNTER — Encounter: Payer: Self-pay | Admitting: Neurology

## 2020-11-13 NOTE — Telephone Encounter (Signed)
Spoke to patient regarding lab results. She has had more frequency urination during the day and getting up in the night as well, but no pain or discomfort.  Please review and advise.  Thanks. Dm/cma

## 2020-11-14 NOTE — Telephone Encounter (Signed)
Spoke to patient and she will come in to give a urine sample on 11/16/20$RemoveBeforeD'@10'fYomeXGvWVmMZR$ :00 am.Dm/cma

## 2020-11-16 ENCOUNTER — Other Ambulatory Visit: Payer: Self-pay

## 2020-11-16 ENCOUNTER — Other Ambulatory Visit: Payer: Medicare Other

## 2020-11-16 DIAGNOSIS — R319 Hematuria, unspecified: Secondary | ICD-10-CM

## 2020-11-16 DIAGNOSIS — R300 Dysuria: Secondary | ICD-10-CM | POA: Diagnosis not present

## 2020-11-16 DIAGNOSIS — N390 Urinary tract infection, site not specified: Secondary | ICD-10-CM

## 2020-11-16 NOTE — Addendum Note (Signed)
Addended by: Beryle Lathe S on: 11/16/2020 11:29 AM   Modules accepted: Orders

## 2020-11-16 NOTE — Addendum Note (Signed)
Addended by: Beryle Lathe S on: 11/16/2020 11:25 AM   Modules accepted: Orders

## 2020-11-16 NOTE — Progress Notes (Signed)
Per the orders of Dr. Gena Fray pt is here to provide urine sample for culture. Pt was able to submit adequate amount of urine.

## 2020-11-20 LAB — URINALYSIS W MICROSCOPIC + REFLEX CULTURE
Bilirubin Urine: NEGATIVE
Glucose, UA: NEGATIVE
Hgb urine dipstick: NEGATIVE
Ketones, ur: NEGATIVE
Nitrites, Initial: POSITIVE — AB
Protein, ur: NEGATIVE
RBC / HPF: NONE SEEN /HPF (ref 0–2)
Specific Gravity, Urine: 1.016 (ref 1.001–1.035)
pH: 5.5 (ref 5.0–8.0)

## 2020-11-20 LAB — URINE CULTURE
MICRO NUMBER:: 12330885
SPECIMEN QUALITY:: ADEQUATE

## 2020-11-20 LAB — CULTURE INDICATED

## 2020-11-20 MED ORDER — SULFAMETHOXAZOLE-TRIMETHOPRIM 800-160 MG PO TABS: 1.0000 | ORAL_TABLET | Freq: Two times a day (BID) | ORAL | 0 refills | Status: AC

## 2020-11-20 NOTE — Addendum Note (Signed)
Addended by: Jon Billings on: 11/20/2020 12:37 PM   Modules accepted: Orders

## 2020-11-27 ENCOUNTER — Telehealth: Payer: Self-pay

## 2020-11-27 ENCOUNTER — Telehealth: Payer: Self-pay | Admitting: Family Medicine

## 2020-11-27 DIAGNOSIS — F411 Generalized anxiety disorder: Secondary | ICD-10-CM

## 2020-11-27 MED ORDER — ALPRAZOLAM 0.5 MG PO TABS: 0.5000 mg | ORAL_TABLET | Freq: Two times a day (BID) | ORAL | 0 refills | Status: DC | PRN

## 2020-11-27 NOTE — Addendum Note (Signed)
Addended by: [name] on: 11/27/2020 06:48 PM   Modules accepted: Orders

## 2020-11-27 NOTE — Telephone Encounter (Signed)
Spoke to patient regarding her finishing the antibiotics.  She would like to know if she needs to repeat the urine sample again or not.  Also she is have some anxiety for the last 2-3 days, which is making it hard for her to sleep and making her skin feel itchy. She used to have RX for Ativan with previous provider that she would have on hand if this happened and only  got #20 which would last her more than a month.  Her last anxiety attack was 2 years ago,she would like to know if you would be willing to give her a prescription for that?   Please review and advise.   Thanks. Dm/cma

## 2020-11-27 NOTE — Chronic Care Management (AMB) (Signed)
  Chronic Care Management   Note  11/27/2020 Name: LINDEY RENZULLI MRN: [id number] DOB: 02-25-1936  Derek Mound is a 85 y.o. year old female who is a primary care patient of Rudd, Lillette Boxer, MD. I reached out to Derek Mound by phone today in response to a referral sent by Ms. Heath Gold Speyer's PCP, Rudd, Lillette Boxer, MD.   Ms. Golding was given information about Chronic Care Management services today including:  CCM service includes personalized support from designated clinical staff supervised by her physician, including individualized plan of care and coordination with other care providers 24/7 contact phone numbers for assistance for urgent and routine care needs. Service will only be billed when office clinical staff spend 20 minutes or more in a month to coordinate care. Only one practitioner may furnish and bill the service in a calendar month. The patient may stop CCM services at any time (effective at the end of the month) by phone call to the office staff.   Patient agreed to services and verbal consent obtained.   Follow up plan:   Tatjana Secretary/administrator

## 2020-11-28 NOTE — Telephone Encounter (Signed)
Lft VM  that RX was sent to the pharmacy. Dm/cma

## 2020-11-30 ENCOUNTER — Ambulatory Visit (INDEPENDENT_AMBULATORY_CARE_PROVIDER_SITE_OTHER): Payer: Medicare Other

## 2020-11-30 ENCOUNTER — Other Ambulatory Visit: Payer: Self-pay

## 2020-11-30 DIAGNOSIS — Z5181 Encounter for therapeutic drug level monitoring: Secondary | ICD-10-CM

## 2020-11-30 DIAGNOSIS — I4891 Unspecified atrial fibrillation: Secondary | ICD-10-CM | POA: Diagnosis not present

## 2020-11-30 DIAGNOSIS — I480 Paroxysmal atrial fibrillation: Secondary | ICD-10-CM

## 2020-11-30 LAB — PROTIME-INR
INR: 5.8 (ref 0.9–1.2)
Prothrombin Time: 54.8 s — ABNORMAL HIGH (ref 9.1–12.0)

## 2020-11-30 LAB — POCT INR: INR: 6.3 — AB (ref 2.0–3.0)

## 2020-11-30 NOTE — Patient Instructions (Signed)
Description   Called and instructed pt to hold Warfarin today 9/16, tomorrow 9/17, and only take 1 tablet Sunday (9/18) then resume taking Warfarin 1.5 tablets daily except for 2 tablets on Tuesdays and Saturdays.  Recheck INR Tuesday (12/04/20). Continue to be consistent with your greens.  Coumadin Clinic [phone].

## 2020-12-04 ENCOUNTER — Other Ambulatory Visit: Payer: Self-pay

## 2020-12-04 ENCOUNTER — Ambulatory Visit: Payer: Medicare Other

## 2020-12-04 DIAGNOSIS — I4891 Unspecified atrial fibrillation: Secondary | ICD-10-CM | POA: Diagnosis not present

## 2020-12-04 DIAGNOSIS — I480 Paroxysmal atrial fibrillation: Secondary | ICD-10-CM

## 2020-12-04 DIAGNOSIS — Z5181 Encounter for therapeutic drug level monitoring: Secondary | ICD-10-CM

## 2020-12-04 LAB — POCT INR: INR: 2.7 (ref 2.0–3.0)

## 2020-12-04 NOTE — Patient Instructions (Signed)
Description   Continue taking Warfarin 1.5 tablets daily except for 2 tablets on Tuesdays and Saturdays.  Recheck INR 2 weeks. Continue to be consistent with your greens.  Coumadin Clinic [phone].

## 2020-12-05 ENCOUNTER — Telehealth: Payer: Self-pay

## 2020-12-05 ENCOUNTER — Ambulatory Visit: Payer: Medicare Other | Admitting: Family Medicine

## 2020-12-05 NOTE — Telephone Encounter (Signed)
Spoke to patient VIA phone.  She has been states that when she got up thsi afternoon off the couch both legs from just above the knees down have swelled. She reports no pain with the swelling.  Got her an appointment with Dr Glori Bickers @ 9:30 am and advised that if something changes from now till then and she starts having pains in her legs to please go to the ER to be checked out.   Patietn is agreeable to this plan.  Dm/cma

## 2020-12-13 ENCOUNTER — Ambulatory Visit: Payer: Medicare Other | Admitting: Neurology

## 2020-12-13 NOTE — Progress Notes (Deleted)
[facility] Neurology [facility] Clinic Note - Initial Visit   Date: 12/13/20  Rebecca Sandoval MRN: [id number] DOB: 10/11/1935   Dear Dr Veto Kemps, Bertram Millard, MD:  Thank you for your kind referral of Rebecca Sandoval for consultation of ***. Although her history is well known to you, please allow [place] to reiterate it for the purpose of our medical record. The patient was accompanied to the clinic by *** who also provides collateral information.     History of Present Illness: Rebecca Sandoval is a 85 y.o. ***-handed female with GERD, PAF,depression, hypertension, CKD stage 3 and gout presenting for evaluation of bilateral feet numbness.   *** No back pain.   She has no history of diabetes, heavy alcohol use, or family history of neuropathy.  Out-side paper records, electronic medical record, and images have been reviewed where available and summarized as:   ***  Past Medical History:  Diagnosis Date   Anxiety    Complication of anesthesia    Difficult to wake up.   Diverticulitis    GERD (gastroesophageal reflux disease)    Hemorrhoid    Obesity    Paroxysmal atrial fibrillation (HCC)    Tachycardia-bradycardia (HCC)     Past Surgical History:  Procedure Laterality Date   ABDOMINAL HYSTERECTOMY  1975   APPENDECTOMY     bartholyn cyst  1973   BILATERAL TEMPOROMANDIBULAR JOINT ARTHROPLASTY  1977   BREAST LUMPECTOMY WITH RADIOACTIVE SEED LOCALIZATION Left 07/28/2014   Procedure: LEFT BREAST LUMPECTOMY WITH RADIOACTIVE SEED LOCALIZATION;  Surgeon: Almond Lint, MD;  Location: [facility] OR;  Service: General;  Laterality: Left;   diverticulitis  1986   HERNIA REPAIR  1996   hiatal   SHOULDER SURGERY     left 97, rigth 98   TONSILLECTOMY       Medications:  Outpatient Encounter Medications as of 12/13/2020  Medication Sig   acetaminophen (TYLENOL) 325 MG tablet Take 650 mg by mouth every 6 (six) hours as needed (pain).   allopurinol (ZYLOPRIM) 300 MG tablet Take 1  tablet (300 mg total) by mouth daily.   ALPRAZolam (XANAX) 0.5 MG tablet Take 1 tablet (0.5 mg total) by mouth 2 (two) times daily as needed for anxiety.   flecainide (TAMBOCOR) 50 MG tablet Take 1 tablet (50 mg total) by mouth 2 (two) times daily.   fluticasone (FLONASE) 50 MCG/ACT nasal spray Place 1 spray into the nose as needed for allergies.    metoprolol succinate (TOPROL XL) 25 MG 24 hr tablet Take 0.5 tablets (12.5 mg total) by mouth daily.   ondansetron (ZOFRAN-ODT) 4 MG disintegrating tablet Take 4 mg by mouth every 6 (six) hours as needed for nausea/vomiting.   PARoxetine (PAXIL) 20 MG tablet Take 1 tablet (20 mg total) by mouth every morning.   triamterene-hydrochlorothiazide (MAXZIDE-25) 37.5-25 MG tablet Take 1 tablet by mouth daily.   warfarin (COUMADIN) 5 MG tablet TAKE 1 AND 1/2 TO 2 TABLETS BY MOUTH DAILY AS DIRECTED  BY THE COUMADIN CLINIC   No facility-administered encounter medications on file as of 12/13/2020.    Allergies:  Allergies  Allergen Reactions   Prednisone     "pt stated this puts her in an uncomfortable zone"   Tetanus Toxoids Hives    Family History: Family History  Problem Relation Age of Onset   Stroke Mother 29   Cancer Paternal Aunt    Cancer Paternal Uncle     Social History: Social History   Tobacco Use  Smoking status: Former   Smokeless tobacco: Never  Scientific laboratory technician Use: Never used  Substance Use Topics   Alcohol use: No   Drug use: No   Social History   Social History Narrative   Pt lives in [place] with spouse    Vital Signs:  There were no vitals taken for this visit.   General Medical Exam:  *** General:  Well appearing, comfortable.   Eyes/ENT: see cranial nerve examination.   Neck:   No carotid bruits. Respiratory:  Clear to auscultation, good air entry bilaterally.   Cardiac:  Regular rate and rhythm, no murmur.   Extremities:  No deformities, edema, or skin discoloration.  Skin:  No rashes or  lesions.  Neurological Exam: MENTAL STATUS including orientation to time, place, person, recent and remote memory, attention span and concentration, language, and fund of knowledge is ***normal.  Speech is not dysarthric.  CRANIAL NERVES: II:  No visual field defects.  Unremarkable fundi.   III-IV-VI: Pupils equal round and reactive to light.  Normal conjugate, extra-ocular eye movements in all directions of gaze.  No nystagmus.  No ptosis***.   V:  Normal facial sensation.    VII:  Normal facial symmetry and movements.   VIII:  Normal hearing and vestibular function.   IX-X:  Normal palatal movement.   XI:  Normal shoulder shrug and head rotation.   XII:  Normal tongue strength and range of motion, no deviation or fasciculation.  MOTOR:  No atrophy, fasciculations or abnormal movements.  No pronator drift.   Upper Extremity:  Right  Left  Deltoid  5/5   5/5   Biceps  5/5   5/5   Triceps  5/5   5/5   Infraspinatus 5/5  5/5  Medial pectoralis 5/5  5/5  Wrist extensors  5/5   5/5   Wrist flexors  5/5   5/5   Finger extensors  5/5   5/5   Finger flexors  5/5   5/5   Dorsal interossei  5/5   5/5   Abductor pollicis  5/5   5/5   Tone (Ashworth scale)  0  0   Lower Extremity:  Right  Left  Hip flexors  5/5   5/5   Hip extensors  5/5   5/5   Adductor 5/5  5/5  Abductor 5/5  5/5  Knee flexors  5/5   5/5   Knee extensors  5/5   5/5   Dorsiflexors  5/5   5/5   Plantarflexors  5/5   5/5   Toe extensors  5/5   5/5   Toe flexors  5/5   5/5   Tone (Ashworth scale)  0  0   MSRs:  Right        Left                  brachioradialis 2+  2+  biceps 2+  2+  triceps 2+  2+  patellar 2+  2+  ankle jerk 2+  2+  Hoffman no  no  plantar response down  down   SENSORY:  Normal and symmetric perception of light touch, pinprick, vibration, and proprioception.  Romberg's sign absent.   COORDINATION/GAIT: Normal finger-to- nose-finger and heel-to-shin.  Intact rapid alternating movements  bilaterally.  Able to rise from a chair without using arms.  Gait narrow based and stable. Tandem and stressed gait intact.    IMPRESSION: ***  PLAN/RECOMMENDATIONS:  *** Return to  clinic in *** months.  Total time spent: ***   Thank you for allowing me to participate in patient's care.  If I can answer any additional questions, I would be pleased to do so.    Sincerely,    Kasson Lamere K. Posey Pronto, DO

## 2020-12-17 ENCOUNTER — Encounter: Payer: Self-pay | Admitting: Neurology

## 2020-12-20 ENCOUNTER — Other Ambulatory Visit: Payer: Self-pay | Admitting: Interventional Cardiology

## 2020-12-21 ENCOUNTER — Other Ambulatory Visit: Payer: Self-pay

## 2020-12-21 ENCOUNTER — Ambulatory Visit: Payer: Medicare Other

## 2020-12-21 DIAGNOSIS — I4891 Unspecified atrial fibrillation: Secondary | ICD-10-CM | POA: Diagnosis not present

## 2020-12-21 DIAGNOSIS — Z5181 Encounter for therapeutic drug level monitoring: Secondary | ICD-10-CM | POA: Diagnosis not present

## 2020-12-21 DIAGNOSIS — I480 Paroxysmal atrial fibrillation: Secondary | ICD-10-CM

## 2020-12-21 LAB — POCT INR: INR: 4.7 — AB (ref 2.0–3.0)

## 2020-12-21 NOTE — Patient Instructions (Signed)
Description   Skip today and tomorrow's dosage of Warfarin, then start taking 1.5 tablets daily.  Recheck INR 10 days. Continue to be consistent with your greens.  Coumadin Clinic [phone].

## 2020-12-31 ENCOUNTER — Ambulatory Visit (INDEPENDENT_AMBULATORY_CARE_PROVIDER_SITE_OTHER): Payer: Medicare Other

## 2020-12-31 ENCOUNTER — Telehealth: Payer: Self-pay | Admitting: Family Medicine

## 2020-12-31 DIAGNOSIS — I480 Paroxysmal atrial fibrillation: Secondary | ICD-10-CM

## 2020-12-31 DIAGNOSIS — I4891 Unspecified atrial fibrillation: Secondary | ICD-10-CM

## 2020-12-31 DIAGNOSIS — F419 Anxiety disorder, unspecified: Secondary | ICD-10-CM

## 2020-12-31 DIAGNOSIS — Z5181 Encounter for therapeutic drug level monitoring: Secondary | ICD-10-CM

## 2020-12-31 LAB — POCT INR: INR: 2.7 (ref 2.0–3.0)

## 2020-12-31 MED ORDER — PAROXETINE HCL 20 MG PO TABS: 20.0000 mg | ORAL_TABLET | ORAL | 0 refills | Status: DC

## 2020-12-31 NOTE — Telephone Encounter (Signed)
RX sent to the pharmacy and patient notified VIA phone.  Dm/cma   LR 04/11/20, #90, 2 rf LOV 10/23/20 FOV 01/23/21.

## 2020-12-31 NOTE — Patient Instructions (Signed)
-   continue taking 1.5 tablets daily.   - Recheck INR in 2 weeks Continue to be consistent with your greens.  Coumadin Clinic [phone].

## 2021-01-02 ENCOUNTER — Telehealth: Payer: Self-pay

## 2021-01-02 NOTE — Progress Notes (Signed)
    Chronic Care Management Pharmacy Assistant   Name: Rebecca Sandoval  MRN: [id number] DOB: 09/12/1935  Chart Review for clinical pharmacist for 01/07/2021.   Conditions to be addressed/monitored: Atrial Fibrillation, HTN, CKD Stage 3b, Anxiety, GERD, Allergic Rhinitis, Osteoarthritis, and Tachycardia-bradycardia syndrome,Gout  Primary concerns for visit include: None    Recent office visits:  10/23/2020 Dr.Rudd MD (PCP)  No medication Changes noted, return in 3 months 07/10/2020 Dr.Rudd MD (PCP) No medication Changes noted, return in 3 months  Recent consult visits:  No recent Consult visit  Hospital visits:  None in previous 6 months  Left Voice message to do initial question prior to patient appointment on 01/07/2021 for CCM at 9:30 with [name] the Clinical pharmacist.    Medications: Outpatient Encounter Medications as of 01/02/2021  Medication Sig   acetaminophen (TYLENOL) 325 MG tablet Take 650 mg by mouth every 6 (six) hours as needed (pain).   allopurinol (ZYLOPRIM) 300 MG tablet Take 1 tablet (300 mg total) by mouth daily.   ALPRAZolam (XANAX) 0.5 MG tablet Take 1 tablet (0.5 mg total) by mouth 2 (two) times daily as needed for anxiety.   flecainide (TAMBOCOR) 50 MG tablet TAKE 1 TABLET BY MOUTH  TWICE DAILY   fluticasone (FLONASE) 50 MCG/ACT nasal spray Place 1 spray into the nose as needed for allergies.    metoprolol succinate (TOPROL-XL) 25 MG 24 hr tablet TAKE ONE-HALF TABLET BY  MOUTH DAILY   ondansetron (ZOFRAN-ODT) 4 MG disintegrating tablet Take 4 mg by mouth every 6 (six) hours as needed for nausea/vomiting.   PARoxetine (PAXIL) 20 MG tablet Take 1 tablet (20 mg total) by mouth every morning.   triamterene-hydrochlorothiazide (MAXZIDE-25) 37.5-25 MG tablet TAKE 1 TABLET BY MOUTH  DAILY   warfarin (COUMADIN) 5 MG tablet TAKE 1 AND 1/2 TO 2 TABLETS BY MOUTH DAILY AS DIRECTED  BY THE COUMADIN CLINIC   No facility-administered encounter medications on  file as of 01/02/2021.    Care Gaps: Shingrix Vaccine  COVID-19 Vaccine (4- Booster for Pfizer series) Influenza Vaccine Star Rating Drugs: None  Medication Fill Gaps: Allopurinol 300 MG last filled 04/11/2020 for 90 day supply  [place] Pharmacist Assistant [phone]

## 2021-01-07 ENCOUNTER — Ambulatory Visit: Payer: Medicare Other

## 2021-01-07 NOTE — Progress Notes (Deleted)
Chronic Care Management Pharmacy Note  01/07/2021 Name:  Rebecca Sandoval MRN:  [id number] DOB:  [birth year]  Summary: ***  Recommendations/Changes made from today's visit: ***  Plan: ***   Subjective: Rebecca Sandoval is an 85 y.o. year old female who is a primary patient of Rudd, Lillette Boxer, MD.  The CCM team was consulted for assistance with disease management and care coordination needs.    Engaged with patient by telephone for initial visit in response to provider referral for pharmacy case management and/or care coordination services.   Consent to Services:  The patient was given the following information about Chronic Care Management services today, agreed to services, and gave verbal consent: 1. CCM service includes personalized support from designated clinical staff supervised by the primary care provider, including individualized plan of care and coordination with other care providers 2. 24/7 contact phone numbers for assistance for urgent and routine care needs. 3. Service will only be billed when office clinical staff spend 20 minutes or more in a month to coordinate care. 4. Only one practitioner may furnish and bill the service in a calendar month. 5.The patient may stop CCM services at any time (effective at the end of the month) by phone call to the office staff. 6. The patient will be responsible for cost sharing (co-pay) of up to 20% of the service fee (after annual deductible is met). Patient agreed to services and consent obtained.  Patient Care Team: [name], MD as PCP - General (Family Medicine) Belva Crome, MD as Consulting Physician (Cardiology) Germaine Pomfret, [facility] as Pharmacist (Pharmacist)  Recent office visits: 10/23/2020 Dr.Rudd MD (PCP)  No medication Changes noted, return in 3 months 07/10/2020 Dr.Rudd MD (PCP) No medication Changes noted, return in 3 months  Recent consult visits: No recent Consult visit  Hospital visits: None in  previous 6 months   Objective:  Lab Results  Component Value Date   CREATININE 1.46 (H) 10/24/2020   BUN 22 10/24/2020   GFR 33.07 (L) 10/24/2020   GFRNONAA 35 (L) 07/24/2014   GFRAA 40 (L) 07/24/2014   NA 140 10/24/2020   K 4.8 10/24/2020   CALCIUM 9.0 10/24/2020   CO2 24 10/24/2020   GLUCOSE 101 (H) 10/24/2020    Lab Results  Component Value Date/Time   HGBA1C (H) 02/05/2009 03:45 AM    6.3 (NOTE) The ADA recommends the following therapeutic goal for glycemic control related to Hgb A1c measurement: Goal of therapy: <6.5 Hgb A1c  Reference: American Diabetes Association: Clinical Practice Recommendations 2010, Diabetes Care, 2010, 33: (Suppl  1).   GFR 33.07 (L) 10/24/2020 09:49 AM    Last diabetic Eye exam: No results found for: HMDIABEYEEXA  Last diabetic Foot exam: No results found for: HMDIABFOOTEX   Lab Results  Component Value Date   CHOL 192 10/24/2020   HDL 40.50 10/24/2020   LDLDIRECT 115.0 10/24/2020   TRIG 235.0 (H) 10/24/2020   CHOLHDL 5 10/24/2020    Hepatic Function Latest Ref Rng & Units 03/14/2013 11/15/2011 12/28/2010  Total Protein 6.0 - 8.3 g/dL 7.7 7.6 7.7  Albumin 3.5 - 5.2 g/dL 3.9 4.1 3.7  AST 0 - 37 U/L 34 25 22  ALT 0 - 35 U/L 36(H) 22 37(H)  Alk Phosphatase 39 - 117 U/L 46 52 54  Total Bilirubin 0.3 - 1.2 mg/dL 0.3 0.2(L) 0.2(L)  Bilirubin, Direct 0.0 - 0.3 mg/dL - <0.1 -    No results found for: TSH, FREET4  CBC Latest Ref Rng & Units 07/24/2014 03/14/2013 11/15/2011  WBC 4.0 - 10.5 K/uL 7.3 15.6(H) 7.9  Hemoglobin 12.0 - 15.0 g/dL 15.1(H) 14.8 14.5  Hematocrit 36.0 - 46.0 % 44.3 42.8 42.0  Platelets 150 - 400 K/uL 199 227 221    No results found for: VD25OH  Clinical ASCVD: No  The ASCVD Risk score (Arnett DK, et al., 2019) failed to calculate for the following reasons:   The 2019 ASCVD risk score is only valid for ages 58 to 17    Depression screen PHQ 2/9 07/10/2020 04/11/2020  Decreased Interest 0 0  Down, Depressed, Hopeless  0 1  PHQ - 2 Score 0 1     ***Other: (CHADS2VASc if Afib, MMRC or CAT for COPD, ACT, DEXA)  Social History   Tobacco Use  Smoking Status Former  Smokeless Tobacco Never   BP Readings from Last 3 Encounters:  10/23/20 (!) 118/56  07/10/20 122/66  04/11/20 134/72   Pulse Readings from Last 3 Encounters:  10/23/20 (!) 53  07/10/20 (!) 55  04/11/20 62   Wt Readings from Last 3 Encounters:  10/23/20 179 lb 12.8 oz (81.6 kg)  07/10/20 177 lb (80.3 kg)  04/11/20 174 lb (78.9 kg)   BMI Readings from Last 3 Encounters:  10/23/20 29.02 kg/m  07/10/20 28.57 kg/m  04/11/20 28.08 kg/m    Assessment/Interventions: Review of patient past medical history, allergies, medications, health status, including review of consultants reports, laboratory and other test data, was performed as part of comprehensive evaluation and provision of chronic care management services.   SDOH:  (Social Determinants of Health) assessments and interventions performed: Yes  SDOH Screenings   Alcohol Screen: Not on file  Depression (PHQ2-9): Low Risk    PHQ-2 Score: 0  Financial Resource Strain: Not on file  Food Insecurity: Not on file  Housing: Not on file  Physical Activity: Not on file  Social Connections: Not on file  Stress: Not on file  Tobacco Use: Medium Risk   Smoking Tobacco Use: Former   Smokeless Tobacco Use: Never   Passive Exposure: Not on file  Transportation Needs: Not on file    CCM Care Plan  Allergies  Allergen Reactions   Prednisone     "pt stated this puts her in an uncomfortable zone"   Tetanus Toxoids Hives    Medications Reviewed Today     Reviewed by [name], MD (Physician) on 10/23/20 at [place] List Status: <None>   Medication Order Taking? Sig Documenting Provider Last Dose Status Informant  acetaminophen (TYLENOL) 325 MG tablet [id number] Yes Take 650 mg by mouth every 6 (six) hours as needed (pain). [provider] Taking Active Self   allopurinol (ZYLOPRIM) 300 MG tablet [id number]  Take 1 tablet (300 mg total) by mouth daily. [name], MD  Expired 07/10/20 2359   flecainide (TAMBOCOR) 50 MG tablet [id number] Yes Take 1 tablet (50 mg total) by mouth 2 (two) times daily. Belva Crome, MD Taking Active   fluticasone [facility]) 50 MCG/ACT nasal spray [id number] Yes Place 1 spray into the nose as needed for allergies.  [provider] Taking Active Self           Med Note Joylene Draft, CRYSTAL N   Wed Jul 19, 2014  2:16 PM)     metoprolol succinate (TOPROL XL) 25 MG 24 hr tablet [id number] Yes Take 0.5 tablets (12.5 mg total) by mouth daily. Belva Crome, MD Taking Active  ondansetron (ZOFRAN-ODT) 4 MG disintegrating tablet [id number] Yes Take 4 mg by mouth every 6 (six) hours as needed for nausea/vomiting. [provider] Taking Active   PARoxetine (PAXIL) 20 MG tablet [id number] Yes Take 1 tablet (20 mg total) by mouth every morning. [name], MD Taking Active   triamterene-hydrochlorothiazide [facility]) 37.5-25 MG tablet [id number] Yes Take 1 tablet by mouth daily. Belva Crome, MD Taking Active   warfarin (COUMADIN) 5 MG tablet [id number] Yes TAKE 1 AND 1/2 TO 2 TABLETS BY MOUTH DAILY AS DIRECTED  BY THE COUMADIN CLINIC Belva Crome, MD Taking Active             Patient Active Problem List   Diagnosis Date Noted   Left palmar fibromatosis 07/10/2020   Allergic rhinitis due to pollen 04/11/2020   Osteoarthritis of carpometacarpal [facility]) joint of right thumb 04/11/2020   History of cancer of left breast 04/11/2020   Essential hypertension 04/11/2020   Obesity    Diverticulitis    Hemorrhoid    GERD (gastroesophageal reflux disease)    Chronic kidney disease, stage 3b ([place]) 04/28/2017   GAD (generalized anxiety disorder) 04/27/2017   Chronic anticoagulation 05/23/2013   Encounter for therapeutic drug monitoring 05/05/2013   Paroxysmal atrial fibrillation ([place]) 09/23/2012    Tachycardia-bradycardia syndrome ([place]) 09/23/2012   Gout 08/11/2011    Immunization History  Administered Date(s) Administered   Fluad Quad(high Dose 65+) 11/27/2018   Hepatitis B 10/19/2017   Hepatitis B, adult 11/23/2017   Influenza Split 02/17/2011, 11/28/2013   Influenza, High Dose Seasonal PF 01/13/2015, 12/20/2016, 11/23/2017   Influenza,inj,quad, With Preservative 01/19/2016   Influenza,trivalent, recombinat, inj, PF 12/02/2011, 12/22/2012   Influenza-Unspecified 12/25/2019   PFIZER(Purple Top)SARS-COV-2 Vaccination 05/08/2019, 05/31/2019, 12/16/2019   Pneumococcal Conjugate-13 03/24/2014   Pneumococcal Polysaccharide-23 01/15/2002    Conditions to be addressed/monitored:  Hypertension, Atrial Fibrillation, Chronic Kidney Disease, Anxiety, and Gout  There are no care plans that you recently modified to display for this patient.    Medication Assistance: {MEDASSISTANCEINFO:25044}  Compliance/Adherence/Medication fill history: Care Gaps: ***  Star-Rating Drugs: ***  Patient's preferred pharmacy is:  [facility]  ([place]) - [place] [place] [address] [place] [address] Phone: [phone] Fax: [place], [place] - [address] [place] [address] Phone: [phone] Fax: [phone]  [facility] ([facility]) - [place], [address] [place] [address] Phone: [phone] Fax: [phone]  Uses pill box? {Yes or If no, why not?:20788} Pt endorses ***% compliance  We discussed: {Pharmacy options:24294} Patient decided to: {US Pharmacy Plan:23885}  Care Plan and Follow Up Patient Decision:  {FOLLOWUP:24991}  Plan: {CM FOLLOW UP EUMP:53614}  ***  Current Barriers:  {pharmacybarriers:24917}  Pharmacist Clinical Goal(s):  Patient will  {PHARMACYGOALCHOICES:24921} through collaboration with PharmD and provider.   Interventions: 1:1 collaboration with [name], MD regarding development and update of comprehensive plan of care as evidenced by provider attestation and co-signature Inter-disciplinary care team collaboration (see longitudinal plan of care) Comprehensive medication review performed; medication list updated in electronic medical record  Hypertension (BP goal {CHL HP UPSTREAM Pharmacist BP ranges:[phone]}) -{US controlled/uncontrolled:25276} -Current treatment: Metoprolol XL 25 mg 1/2 tablet daily  Triamterene-HCTZ 37.5-25 mg daily  -Medications previously tried: ***  -Current home readings: *** -Current dietary habits: *** -Current exercise habits: *** -{ACTIONS;DENIES/REPORTS:21021675::"Denies"}  hypotensive/hypertensive symptoms -Educated on {CCM BP Counseling:25124} -Counseled to monitor BP at home ***, document, and provide log at future appointments -{CCMPHARMDINTERVENTION:25122}  Atrial Fibrillation (Goal: prevent stroke and major bleeding) -{US controlled/uncontrolled:25276} -CHADSVASC: *** -Current treatment: Rate control:  Flecainide 50 mg twice daily  Metoprolol XL 25 mg 1/2 tablet daily  Anticoagulation: Warfarin 5 mg 1.5 tablets daily -Medications previously tried: *** -Home BP and HR readings: ***  -Counseled on {CCMAFIBCOUNSELING:25120} -{CCMPHARMDINTERVENTION:25122}  Anxiety (Goal: Achieve symptom remission) -Controlled -Current treatment: Alprazolam 0.5 mg twice daily as needed  Paroxetine 20 mg daily  -Medications previously tried/failed: *** -PHQ9: 0 -GAD7: NA -Connected with *** for mental health support -Educated on {CCM mental health counseling:25127} -{CCMPHARMDINTERVENTION:25122}  Gout (Goal: Prevent gout flares) -{US controlled/uncontrolled:25276} -Last Gout Flare: *** -Current treatment  Allopurinol 300 mg daily  -Medications previously tried: ***  -We  discussed:  {gout counseling:24106} -{CCMPHARMDINTERVENTION:25122}  Chronic Kidney Disease Stage 3b  -All medications assessed for renal dosing and appropriateness in chronic kidney disease. -{CCMPHARMDINTERVENTION:25122}   Patient Goals/Self-Care Activities Patient will:  - {pharmacypatientgoals:24919}  Follow Up Plan: {CM FOLLOW UP VNRW:41364}

## 2021-01-14 ENCOUNTER — Ambulatory Visit (INDEPENDENT_AMBULATORY_CARE_PROVIDER_SITE_OTHER): Payer: Medicare Other | Admitting: *Deleted

## 2021-01-14 ENCOUNTER — Other Ambulatory Visit: Payer: Self-pay

## 2021-01-14 DIAGNOSIS — Z5181 Encounter for therapeutic drug level monitoring: Secondary | ICD-10-CM

## 2021-01-14 DIAGNOSIS — I480 Paroxysmal atrial fibrillation: Secondary | ICD-10-CM | POA: Diagnosis not present

## 2021-01-14 DIAGNOSIS — I4891 Unspecified atrial fibrillation: Secondary | ICD-10-CM | POA: Diagnosis not present

## 2021-01-14 LAB — POCT INR: INR: 3.1 — AB (ref 2.0–3.0)

## 2021-01-14 NOTE — Patient Instructions (Addendum)
Description   Today take 1 tablet then continue taking Warfarin 1.5 tablets daily. Recheck INR in 3 weeks. Add another dark green leafy vegetable in your diet and remain consistent. Coumadin Clinic [phone].

## 2021-01-23 ENCOUNTER — Other Ambulatory Visit: Payer: Self-pay

## 2021-01-23 ENCOUNTER — Ambulatory Visit (INDEPENDENT_AMBULATORY_CARE_PROVIDER_SITE_OTHER): Payer: Medicare Other | Admitting: Family Medicine

## 2021-01-23 VITALS — BP 120/62 | HR 82 | Temp 97.3°F | Ht 66.0 in | Wt 177.4 lb

## 2021-01-23 DIAGNOSIS — I10 Essential (primary) hypertension: Secondary | ICD-10-CM

## 2021-01-23 DIAGNOSIS — R200 Anesthesia of skin: Secondary | ICD-10-CM

## 2021-01-23 DIAGNOSIS — Z7901 Long term (current) use of anticoagulants: Secondary | ICD-10-CM

## 2021-01-23 DIAGNOSIS — Z881 Allergy status to other antibiotic agents status: Secondary | ICD-10-CM | POA: Diagnosis not present

## 2021-01-23 DIAGNOSIS — I480 Paroxysmal atrial fibrillation: Secondary | ICD-10-CM | POA: Diagnosis not present

## 2021-01-23 NOTE — Progress Notes (Signed)
[facility] [address] [place] [address] Dept: [phone] Dept Fax: [phone]  Chronic Care Office Visit  Subjective:    Patient ID: Rebecca Sandoval, female    DOB: 06-06-1935, 85 y.o..   MRN: [id number]  Chief Complaint  Patient presents with   Follow-up    3 month f/u.  No concerns.     History of Present Illness:  Patient is in today for reassessment of chronic medical issues.  Ms. Rebecca Sandoval has a history of hypertension, managed with Maxzide.   Ms. Rebecca Sandoval has a history of a. fib and tachy-brady syndrome. She is on metoprolol and flecainide for regulation of her heart and warfarin for anticoagulation, managed by the anticoag clinic. She notes her INR has been difficult to maintain recently after taking a course of Septra for a UTI. Her last INR was 3.1, so this is close to being back to goal (INR of 2-3).  Ms. Rebecca Sandoval was treated in early Sept. for a urine culture which was positive for E. coli. On the 4th day of therapy, she broke out in a diffuse rash and felt quite weak. This has since resolved.   Ms. Rebecca Sandoval has a pending appointment with podiatry to assess numbness in her toes.  Past Medical History: Patient Active Problem List   Diagnosis Date Noted   Left palmar fibromatosis 07/10/2020   Allergic rhinitis due to pollen 04/11/2020   Osteoarthritis of carpometacarpal [facility]) joint of right thumb 04/11/2020   History of cancer of left breast 04/11/2020   Essential hypertension 04/11/2020   Obesity    Diverticulitis    Hemorrhoid    GERD (gastroesophageal reflux disease)    Chronic kidney disease, stage 3b ([place]) 04/28/2017   GAD (generalized anxiety disorder) 04/27/2017   Chronic anticoagulation 05/23/2013   Encounter for therapeutic drug monitoring 05/05/2013   Paroxysmal atrial fibrillation ([place]) 09/23/2012   Tachycardia-bradycardia syndrome ([place]) 09/23/2012   Gout 08/11/2011   Past Surgical  History:  Procedure Laterality Date   ABDOMINAL HYSTERECTOMY  1975   APPENDECTOMY     bartholyn cyst  1973   BILATERAL TEMPOROMANDIBULAR JOINT ARTHROPLASTY  1977   BREAST LUMPECTOMY WITH RADIOACTIVE SEED LOCALIZATION Left 07/28/2014   Procedure: LEFT BREAST LUMPECTOMY WITH RADIOACTIVE SEED LOCALIZATION;  Surgeon: [name], MD;  Location: [place];  Service: General;  Laterality: Left;   diverticulitis  [place]   hiatal   SHOULDER SURGERY     left 62, rigth 41   TONSILLECTOMY     Family History  Problem Relation Age of Onset   Stroke Mother 90   Cancer Paternal Aunt    Cancer Paternal Uncle    Outpatient Medications Prior to Visit  Medication Sig Dispense Refill   acetaminophen (TYLENOL) 325 MG tablet Take 650 mg by mouth every 6 (six) hours as needed (pain).     allopurinol (ZYLOPRIM) 300 MG tablet Take 1 tablet (300 mg total) by mouth daily. 90 tablet 0   ALPRAZolam (XANAX) 0.5 MG tablet Take 1 tablet (0.5 mg total) by mouth 2 (two) times daily as needed for anxiety. 20 tablet 0   flecainide (TAMBOCOR) 50 MG tablet TAKE 1 TABLET BY MOUTH  TWICE DAILY 180 tablet 1   fluticasone (FLONASE) 50 MCG/ACT nasal spray Place 1 spray into the nose as needed for allergies.      metoprolol succinate (TOPROL-XL) 25 MG 24 hr tablet TAKE ONE-HALF TABLET BY  MOUTH DAILY 45 tablet  1   ondansetron (ZOFRAN-ODT) 4 MG disintegrating tablet Take 4 mg by mouth every 6 (six) hours as needed for nausea/vomiting.     PARoxetine (PAXIL) 20 MG tablet Take 1 tablet (20 mg total) by mouth every morning. 90 tablet 0   triamterene-hydrochlorothiazide (MAXZIDE-25) 37.5-25 MG tablet TAKE 1 TABLET BY MOUTH  DAILY 90 tablet 1   warfarin (COUMADIN) 5 MG tablet TAKE 1 AND 1/2 TO 2 TABLETS BY MOUTH DAILY AS DIRECTED  BY THE COUMADIN CLINIC 160 tablet 1   No facility-administered medications prior to visit.   Allergies  Allergen Reactions   Prednisone     "pt stated this puts her in an  uncomfortable zone"   Tetanus Toxoids Hives   Septra [Sulfamethoxazole-Trimethoprim] Rash   Objective:   Today's Vitals   01/23/21 1351  BP: 120/62  Pulse: 82  Temp: (!) 97.3 F (36.3 C)  TempSrc: Temporal  SpO2: 95%  Weight: 177 lb 6.4 oz (80.5 kg)  Height: $Remove'5\' 6"'SaOfgZQ$  (1.676 m)   Body mass index is 28.63 kg/m.   General: Well developed, well nourished. No acute distress. Psych: Alert and oriented. Normal mood and affect.  Health Maintenance Due  Topic Date Due   Zoster Vaccines- Shingrix (1 of 2) Never done   Pneumonia Vaccine 65+ Years old (3) 03/25/2015   COVID-19 Vaccine (4 - Booster for Pfizer series) 02/10/2020   INFLUENZA VACCINE  10/15/2020   Lab Results Lab Results  Component Value Date   INR 3.1 (A) 01/14/2021   INR 2.7 12/31/2020   INR 4.7 (A) 12/21/2020     Lab Results  Component Value Date   CHOL 192 10/24/2020   HDL 40.50 10/24/2020   LDLDIRECT 115.0 10/24/2020   TRIG 235.0 (H) 10/24/2020   CHOLHDL 5 10/24/2020   BMP Latest Ref Rng & Units 10/24/2020 07/24/2014 03/14/2013  Glucose 70 - 99 mg/dL 101(H) 104(H) 113(H)  BUN 6 - 23 mg/dL 22 25(H) 21  Creatinine 0.40 - 1.20 mg/dL 1.46(H) 1.42(H) 1.32(H)  Sodium 135 - 145 mEq/L 140 140 136  Potassium 3.5 - 5.1 mEq/L 4.8 4.3 5.0  Chloride 96 - 112 mEq/L 106 106 98  CO2 19 - 32 mEq/L $Remove'24 27 25  'WiVpgxC$ Calcium 8.4 - 10.5 mg/dL 9.0 9.3 9.6   Assessment & Plan:   1. Essential hypertension Blood pressure is at goal. We will continue metoprolol and Maxzide/  2. Paroxysmal atrial fibrillation (HCC) Well controlled with flecainde. Ms. Rebecca Sandoval will continue to work with the anticoagulation clinic on monitoring her Coumadin.  3. Chronic anticoagulation As above.  4. Drug allergy- Septra By history, it sounds like Ms. Rebecca Sandoval did have an allergic reaction to Septra. I will record this new allergy.  5. Numbness of toes Podiatry appointment pending.  Rebecca Salter, MD

## 2021-02-03 ENCOUNTER — Other Ambulatory Visit: Payer: Self-pay | Admitting: Interventional Cardiology

## 2021-02-20 ENCOUNTER — Telehealth: Payer: Self-pay | Admitting: *Deleted

## 2021-02-20 NOTE — Telephone Encounter (Signed)
Pt overdue to have INR check.  Attempted to call but was not able to get in touch with patient.

## 2021-02-21 ENCOUNTER — Ambulatory Visit
Admission: RE | Admit: 2021-02-21 | Discharge: 2021-02-21 | Disposition: A | Discharge status: Home / Self Care | Payer: Medicare Other | Source: Ambulatory Visit | Attending: Family Medicine | Admitting: Family Medicine

## 2021-02-21 ENCOUNTER — Other Ambulatory Visit: Payer: Medicare Other

## 2021-02-21 DIAGNOSIS — R922 Inconclusive mammogram: Secondary | ICD-10-CM | POA: Diagnosis not present

## 2021-02-21 DIAGNOSIS — Z853 Personal history of malignant neoplasm of breast: Secondary | ICD-10-CM

## 2021-03-01 ENCOUNTER — Encounter: Payer: Self-pay | Admitting: Neurology

## 2021-03-01 ENCOUNTER — Other Ambulatory Visit: Payer: Self-pay

## 2021-03-01 ENCOUNTER — Ambulatory Visit: Payer: Medicare Other | Admitting: Neurology

## 2021-03-01 VITALS — BP 135/70 | HR 56 | Ht 66.0 in | Wt 179.0 lb

## 2021-03-01 DIAGNOSIS — M25561 Pain in right knee: Secondary | ICD-10-CM | POA: Diagnosis not present

## 2021-03-01 DIAGNOSIS — R200 Anesthesia of skin: Secondary | ICD-10-CM

## 2021-03-01 DIAGNOSIS — M48061 Spinal stenosis, lumbar region without neurogenic claudication: Secondary | ICD-10-CM

## 2021-03-01 DIAGNOSIS — R292 Abnormal reflex: Secondary | ICD-10-CM

## 2021-03-01 NOTE — Patient Instructions (Addendum)
Refer to Dr. French Ana for right knee pain  MRI lumbar spine without contrast  We will call you with the results and let you know the next step

## 2021-03-01 NOTE — Progress Notes (Signed)
Rebecca Sandoval   Date: 03/01/21  Rebecca Sandoval MRN: [id number] DOB: Nov 14, 1935   Dear Dr. Gena Fray:  Thank you for your kind referral of Rebecca Sandoval for consultation of numbness of the toes. Although her history is well known to you, please allow [place] to reiterate it for the purpose of our medical record. The patient was accompanied to the clinic by self.    History of Present Illness: Rebecca Sandoval is a 85 y.o. left-handed female with hypertension, atrial fibrillation, tachy-brady syndrome, stage 3 CKD, and gout presenting for evaluation of numbness of the toes.  She woke up about 6 months ago with sudden onset of numbness/tingling involving all of the toes. Symptoms are constant and less noticeable when she wears shoes.  No weakness, imbalance, or falls. No exertional leg weakness or radicular pain. She has mild chronic low back pain.   Over Thanksgiving, she developed acute onset of right knee pain and stiffness. No preceding injury. She has difficulty with flexing the knees and extending it, such as when walking. It is achy and throbbing. She has tried tylenol, ice and head without relief.  She is unable to take NSAIDs due to being on anticoagulation.  She lives in a one-level home with her husband.  She stays very active maintaining her home and has several hobbies including painting stained glass.   Past Medical History:  Diagnosis Date   Anxiety    Complication of anesthesia    Difficult to wake up.   Diverticulitis    GERD (gastroesophageal reflux disease)    Hemorrhoid    Obesity    Paroxysmal atrial fibrillation (HCC)    Tachycardia-bradycardia (HCC)     Past Surgical History:  Procedure Laterality Date   ABDOMINAL HYSTERECTOMY  03/17/1973   APPENDECTOMY     bartholyn cyst  03/18/1971   BILATERAL TEMPOROMANDIBULAR JOINT ARTHROPLASTY  03/18/1975   BREAST LUMPECTOMY Left 07/28/2014   BREAST LUMPECTOMY WITH  RADIOACTIVE SEED LOCALIZATION Left 07/28/2014   Procedure: LEFT BREAST LUMPECTOMY WITH RADIOACTIVE SEED LOCALIZATION;  Surgeon: [name], MD;  Location: [place];  Service: General;  Laterality: Left;   diverticulitis  03/17/1984   HERNIA REPAIR  03/17/1994   hiatal   SHOULDER SURGERY     left 97, rigth 98   TONSILLECTOMY       Medications:  Outpatient Encounter Medications as of 03/01/2021  Medication Sig   ALPRAZolam (XANAX) 0.5 MG tablet Take 1 tablet (0.5 mg total) by mouth 2 (two) times daily as needed for anxiety.   flecainide (TAMBOCOR) 50 MG tablet TAKE 1 TABLET BY MOUTH  TWICE DAILY   metoprolol succinate (TOPROL-XL) 25 MG 24 hr tablet TAKE ONE-HALF TABLET BY  MOUTH DAILY   ondansetron (ZOFRAN-ODT) 4 MG disintegrating tablet Take 4 mg by mouth every 6 (six) hours as needed for nausea/vomiting.   PARoxetine (PAXIL) 20 MG tablet Take 1 tablet (20 mg total) by mouth every morning.   triamterene-hydrochlorothiazide (MAXZIDE-25) 37.5-25 MG tablet TAKE 1 TABLET BY MOUTH  DAILY   warfarin (COUMADIN) 5 MG tablet TAKE 1 AND 1/2 TO 2 TABLETS BY MOUTH DAILY AS DIRECTED  BY THE COUMADIN CLINIC   acetaminophen (TYLENOL) 325 MG tablet Take 650 mg by mouth every 6 (six) hours as needed (pain).   [DISCONTINUED] allopurinol (ZYLOPRIM) 300 MG tablet Take 1 tablet (300 mg total) by mouth daily.   [DISCONTINUED] fluticasone (FLONASE) 50 MCG/ACT nasal spray Place 1 spray into the nose  as needed for allergies.  (Patient not taking: Reported on 03/01/2021)   No facility-administered encounter medications on file as of 03/01/2021.    Allergies:  Allergies  Allergen Reactions   Prednisone     "pt stated this puts her in an uncomfortable zone"   Tetanus Toxoids Hives   Septra [Sulfamethoxazole-Trimethoprim] Rash    Family History: Family History  Problem Relation Age of Onset   Stroke Mother 46   Cancer Paternal 59    Cancer Paternal Uncle     Social History: Social History    Tobacco Use   Smoking status: Former   Smokeless tobacco: Never  Scientific laboratory technician Use: Never used  Substance Use Topics   Alcohol use: No   Drug use: No   Social History   Social History Narrative   Pt lives in [place] with spouse      Left handed    Lives in a one story home    Vital Signs:  BP 135/70    Pulse (!) 56    Ht $R'5\' 6"'QD$  (1.676 m)    Wt 179 lb (81.2 kg)    SpO2 98%    BMI 28.89 kg/m   Neurological Exam: MENTAL STATUS including orientation to time, place, person, recent and remote memory, attention span and concentration, language, and fund of knowledge is normal.  Speech is not dysarthric.  CRANIAL NERVES: II:  No visual field defects.    III-IV-VI: Pupils equal round and reactive to light.  Normal conjugate, extra-ocular eye movements in all directions of gaze.  No nystagmus.  No ptosis.   V:  Normal facial sensation.    VII:  Normal facial symmetry and movements.   VIII:  Normal hearing and vestibular function.   IX-X:  Normal palatal movement.   XI:  Normal shoulder shrug and head rotation.   XII:  Normal tongue strength and range of motion, no deviation or fasciculation.  MOTOR: Right knee flexion and extension is severely limited by pain.  Motor strength of the upper extremities and lower extremities is 5/5.  No atrophy, fasciculations or abnormal movements.  No pronator drift.  Normal tone.  MSRs:  Right        Left                  brachioradialis 2+  2+  biceps 2+  2+  triceps 2+  2+  patellar 3+  3+  ankle jerk 2+  2+  Hoffman no  no  plantar response down  down  Crossed adductors bilaterally  SENSORY:  Normal and symmetric perception of light touch, pinprick, vibration, and temperature.   COORDINATION/GAIT: Normal finger-to- nose-finger.  Intact rapid alternating movements bilaterally.  Gait mildly antalgic due to right knee pain. Unassisted.    IMPRESSION: Bilateral feet numbness. Exam shows preserved distal sensation, strength, and  reflexes which does not support neuropathy. Further, her symptoms started suddenly.  Her reflexes are hyperreflexic and suggests that she may have underlying lumbar canal stenosis.  I will check MRI lumbar spine wo contrast to evaluate for compressive lesion.  NCS/EMG of the legs can be performed going forward, if needed. Atraumatic right knee pain, acute. She has seen Dr. French Ana in the past, so will send a referral to him for evaluation.  Further recommendations pending results.  Thank you for allowing me to participate in patient's care.  If I can answer any additional questions, I would be pleased to do so.    Sincerely,  Rebecca Quackenbush K. Posey Pronto, DO

## 2021-03-04 ENCOUNTER — Telehealth: Payer: Self-pay | Admitting: Neurology

## 2021-03-04 NOTE — Telephone Encounter (Signed)
Patient spoke with dr.caffery's office and they told her they have not received a referral, for their office. Fax number is [phone]

## 2021-03-04 NOTE — Telephone Encounter (Signed)
Mahina will resend the referral today.

## 2021-03-07 DIAGNOSIS — M25561 Pain in right knee: Secondary | ICD-10-CM | POA: Diagnosis not present

## 2021-03-14 ENCOUNTER — Other Ambulatory Visit: Payer: Self-pay

## 2021-03-14 ENCOUNTER — Ambulatory Visit: Payer: Medicare Other | Admitting: *Deleted

## 2021-03-14 ENCOUNTER — Telehealth: Payer: Self-pay | Admitting: Neurology

## 2021-03-14 DIAGNOSIS — I4891 Unspecified atrial fibrillation: Secondary | ICD-10-CM

## 2021-03-14 DIAGNOSIS — I480 Paroxysmal atrial fibrillation: Secondary | ICD-10-CM

## 2021-03-14 DIAGNOSIS — Z5181 Encounter for therapeutic drug level monitoring: Secondary | ICD-10-CM

## 2021-03-14 LAB — POCT INR: INR: 2.6 (ref 2.0–3.0)

## 2021-03-14 NOTE — Telephone Encounter (Signed)
Pt called in after getting a missed call from our office. I didn't see any notes about anyone calling her. She stated she is going to have to cancel her MRI on 03/23/21 due to her not having her meniscus problem figured out yet.

## 2021-03-14 NOTE — Patient Instructions (Addendum)
Description   Continue taking Warfarin 1.5 tablets daily. Recheck INR in 6 weeks. Coumadin Clinic [phone].

## 2021-03-14 NOTE — Telephone Encounter (Signed)
Called patient and informed her that we have not called her from what I can see on her chart. Patient stated it must be another [facility] office. Patient wanted to let Dr. Posey Pronto know that she canceled her MRI on 03/23/21 and will rescheduled once she gets her insurance and knee situated. I informed patient I would let Dr. Posey Pronto know and informed the patient to give [place] a call when she is ready for her MRI.

## 2021-03-17 ENCOUNTER — Other Ambulatory Visit: Payer: Self-pay | Admitting: Interventional Cardiology

## 2021-03-23 ENCOUNTER — Other Ambulatory Visit: Payer: Medicare Other

## 2021-03-29 ENCOUNTER — Telehealth: Payer: Self-pay | Admitting: Interventional Cardiology

## 2021-03-29 NOTE — Telephone Encounter (Signed)
OK to take. Does have mild interaction with her paroxetine - can increase risk of psychomotor impairment.

## 2021-03-29 NOTE — Telephone Encounter (Signed)
Any issues with her other meds?

## 2021-03-29 NOTE — Telephone Encounter (Signed)
Spoke with pt and made her aware of information.  Pt appreciative for help.

## 2021-03-29 NOTE — Telephone Encounter (Signed)
Pt c/o medication issue:  1. Name of Medication:  Alprazolam 5 mg  2. How are you currently taking this medication (dosage and times per day)? Has not started  3. Are you having a reaction (difficulty breathing--STAT)? no  4. What is your medication issue? Patient calling to see if she is okay to start the new anxiety medication.

## 2021-04-02 ENCOUNTER — Other Ambulatory Visit: Payer: Self-pay | Admitting: Family Medicine

## 2021-04-02 DIAGNOSIS — F419 Anxiety disorder, unspecified: Secondary | ICD-10-CM

## 2021-04-02 MED ORDER — PAROXETINE HCL 20 MG PO TABS: 20.0000 mg | ORAL_TABLET | ORAL | 3 refills | Status: DC

## 2021-04-02 NOTE — Telephone Encounter (Signed)
Pt called and her insurance changed to [facility] so she needs her pharmacy updated and a medication sent in for her [place] Phone: [phone] Fax: [phone]  PARoxetine (PAXIL) 20 MG tablet   Sge said she needs sent in as soon as possible because she only has 4 days left. Please advise

## 2021-04-02 NOTE — Telephone Encounter (Signed)
Spoke to patient, she is okay regarding the amount of her Paxil she has left.  Can you please and thank you send in a new RX to her mail order? Thanks. Dm/cma

## 2021-04-03 NOTE — Telephone Encounter (Signed)
Rx sent to provider yesterday. Dm/cma

## 2021-04-17 ENCOUNTER — Ambulatory Visit: Payer: Medicare HMO

## 2021-04-22 ENCOUNTER — Other Ambulatory Visit: Payer: Self-pay

## 2021-04-23 ENCOUNTER — Other Ambulatory Visit: Payer: Self-pay

## 2021-04-24 ENCOUNTER — Ambulatory Visit (INDEPENDENT_AMBULATORY_CARE_PROVIDER_SITE_OTHER): Payer: Medicare HMO | Admitting: Family Medicine

## 2021-04-24 VITALS — BP 120/68 | HR 51 | Temp 97.4°F | Ht 66.0 in | Wt 179.2 lb

## 2021-04-24 DIAGNOSIS — F411 Generalized anxiety disorder: Secondary | ICD-10-CM

## 2021-04-24 DIAGNOSIS — I480 Paroxysmal atrial fibrillation: Secondary | ICD-10-CM | POA: Diagnosis not present

## 2021-04-24 DIAGNOSIS — Z7901 Long term (current) use of anticoagulants: Secondary | ICD-10-CM | POA: Diagnosis not present

## 2021-04-24 DIAGNOSIS — I10 Essential (primary) hypertension: Secondary | ICD-10-CM | POA: Diagnosis not present

## 2021-04-24 DIAGNOSIS — M1711 Unilateral primary osteoarthritis, right knee: Secondary | ICD-10-CM

## 2021-04-24 DIAGNOSIS — R69 Illness, unspecified: Secondary | ICD-10-CM | POA: Diagnosis not present

## 2021-04-24 NOTE — Progress Notes (Signed)
[facility] [address] [place] [address] Dept: [phone] Dept Fax: [phone]  Chronic Care Office Visit  Subjective:    Patient ID: Rebecca Sandoval, female    DOB: November 18, 1935, 86 y.o..   MRN: [id number]  Chief Complaint  Patient presents with   Follow-up    3 month f/u.      History of Present Illness:  Patient is in today for reassessment of chronic medical issues.  Ms. Rebecca Sandoval has a history of hypertension. She is managed with Maxzide.    Ms. Rebecca Sandoval has a history of a. fib and tachy-brady syndrome. She is on metoprolol and flecainide for regulation of her heart and warfarin for anticoagulation, managed by the anticoag clinic. Her last INR was at goal. She notes that she tries to be consistent with her intake of greens..  I had referred Ms. Rebecca Sandoval for evaluation of numbness in her toes. She was seen by Dr. Posey Sandoval (neurology) who did not find evidence of neuropathy. She was concerned for a possible lumbar disc issue and ordered an MRI of the lumbar spine. She also saw Dr. French Sandoval (orthopedics) as she was having a flare of right knee pain. She had x-rays that showed degenerative arthritis of the knee. Ms. Rebecca Sandoval canceled her MRI (for several reasons). She has noted that many of her symptoms have been gradually improving with time, so she is not interested in pursuing this further currently.  Ms. Rebecca Sandoval has a history of anxiety. She is managed on Paxil. She had received some Xanax for acute issues a few months back. She notes when she took this, she had a sudden increase in her anxiety and was unable to feel comfortable indoors. She notes Ativan has not caused this in the past.  Past Medical History: Patient Active Problem List   Diagnosis Date Noted   Left palmar fibromatosis 07/10/2020   Allergic rhinitis due to pollen 04/11/2020   Osteoarthritis of carpometacarpal [facility]) joint of right thumb 04/11/2020   History  of cancer of left breast 04/11/2020   Essential hypertension 04/11/2020   Obesity    Diverticulitis    Hemorrhoid    GERD (gastroesophageal reflux disease)    Chronic kidney disease, stage 3b ([place]) 04/28/2017   GAD (generalized anxiety disorder) 04/27/2017   Chronic anticoagulation 05/23/2013   Encounter for therapeutic drug monitoring 05/05/2013   Paroxysmal atrial fibrillation ([place]) 09/23/2012   Tachycardia-bradycardia syndrome ([place]) 09/23/2012   Gout 08/11/2011   Past Surgical History:  Procedure Laterality Date   ABDOMINAL HYSTERECTOMY  03/17/1973   APPENDECTOMY     bartholyn cyst  03/18/1971   BILATERAL TEMPOROMANDIBULAR JOINT ARTHROPLASTY  03/18/1975   BREAST LUMPECTOMY Left 07/28/2014   BREAST LUMPECTOMY WITH RADIOACTIVE SEED LOCALIZATION Left 07/28/2014   Procedure: LEFT BREAST LUMPECTOMY WITH RADIOACTIVE SEED LOCALIZATION;  Surgeon: [name], MD;  Location: [place];  Service: General;  Laterality: Left;   diverticulitis  03/17/1984   HERNIA REPAIR  03/17/1994   hiatal   SHOULDER SURGERY     left 30, rigth 81   TONSILLECTOMY     Family History  Problem Relation Age of Onset   Stroke Mother 46   Cancer Paternal Aunt    Cancer Paternal Uncle    Outpatient Medications Prior to Visit  Medication Sig Dispense Refill   acetaminophen (TYLENOL) 325 MG tablet Take 650 mg by mouth every 6 (six) hours as needed (pain).     flecainide (TAMBOCOR) 50 MG tablet TAKE 1 TABLET  BY MOUTH  TWICE DAILY 180 tablet 1   metoprolol succinate (TOPROL-XL) 25 MG 24 hr tablet TAKE ONE-HALF TABLET BY  MOUTH DAILY 45 tablet 1   ondansetron (ZOFRAN-ODT) 4 MG disintegrating tablet Take 4 mg by mouth every 6 (six) hours as needed for nausea/vomiting.     PARoxetine (PAXIL) 20 MG tablet Take 1 tablet (20 mg total) by mouth every morning. 90 tablet 3   triamterene-hydrochlorothiazide (MAXZIDE-25) 37.5-25 MG tablet TAKE 1 TABLET BY MOUTH  DAILY 90 tablet 1   warfarin (COUMADIN) 5 MG tablet TAKE 1  AND 1/2 TABLETS DAILY AS DIRECTED  BY THE COUMADIN CLINIC 135 tablet 1   ALPRAZolam (XANAX) 0.5 MG tablet Take 1 tablet (0.5 mg total) by mouth 2 (two) times daily as needed for anxiety. 20 tablet 0   No facility-administered medications prior to visit.   Allergies  Allergen Reactions   Prednisone     "pt stated this puts her in an uncomfortable zone"   Tetanus Toxoids Hives   Alprazolam Anxiety    Increased agitation   Septra [Sulfamethoxazole-Trimethoprim] Rash    Objective:   Today's Vitals   04/24/21 1313  BP: 120/68  Pulse: (!) 51  Temp: (!) 97.4 F (36.3 C)  TempSrc: Temporal  SpO2: 96%  Weight: 179 lb 3.2 oz (81.3 kg)  Height: $Remove'5\' 6"'oMpIyfe$  (1.676 m)   Body mass index is 28.92 kg/m.   General: Well developed, well nourished. No acute distress. Psych: Alert and oriented. Normal mood and affect.  Health Maintenance Due  Topic Date Due   Zoster Vaccines- Shingrix (1 of 2) Never done   Pneumonia Vaccine 101+ Years old (33) 03/25/2015   COVID-19 Vaccine (4 - Booster for Pfizer series) 02/10/2020   Lab Results Lab Results  Component Value Date   INR 2.6 03/14/2021   INR 3.1 (A) 01/14/2021   INR 2.7 12/31/2020     Assessment & Plan:   1. Paroxysmal atrial fibrillation (HCC) 2. Chronic anticoagulation Stable on flecainide and metoprolol. She will continue to follow with the anticoagulation clinic for managing her Coumadin.   3. Essential hypertension Blood pressure at goal. Continue metoprolol and Maxzide. Plan to reassess in 6 months. Will do annual BMP at that visit.  4. GAD (generalized anxiety disorder) Ms. Rebecca Sandoval is back to doing well on her Paxil. If she needs something for acute anxiety in the future, we will use lorazepam.  5. Primary osteoarthritis of right knee She is managing with this currently. If the knee flares and she wants to pursue other treatment options, she will reach out to me.  [name], MD

## 2021-04-25 ENCOUNTER — Ambulatory Visit: Payer: Medicare HMO

## 2021-04-25 ENCOUNTER — Other Ambulatory Visit: Payer: Self-pay

## 2021-04-25 DIAGNOSIS — I4891 Unspecified atrial fibrillation: Secondary | ICD-10-CM

## 2021-04-25 DIAGNOSIS — I480 Paroxysmal atrial fibrillation: Secondary | ICD-10-CM | POA: Diagnosis not present

## 2021-04-25 DIAGNOSIS — Z5181 Encounter for therapeutic drug level monitoring: Secondary | ICD-10-CM | POA: Diagnosis not present

## 2021-04-25 LAB — POCT INR: INR: 2.5 (ref 2.0–3.0)

## 2021-04-25 NOTE — Patient Instructions (Signed)
Description   Continue taking Warfarin 1.5 tablets daily. Recheck INR in 6 weeks. Coumadin Clinic [phone].

## 2021-05-03 ENCOUNTER — Telehealth: Payer: Self-pay | Admitting: Family Medicine

## 2021-05-03 NOTE — Chronic Care Management (AMB) (Signed)
°  Chronic Care Management   Outreach Note  05/03/2021 Name: Rebecca Sandoval MRN: [id number] DOB: April 26, 1935  Referred by: [name], MD Reason for referral : No chief complaint on file.   An unsuccessful telephone outreach was attempted today. The patient was referred to the pharmacist for assistance with care management and care coordination.   Follow Up Plan:   Tatjana Dellinger Upstream Scheduler

## 2021-05-07 ENCOUNTER — Ambulatory Visit (INDEPENDENT_AMBULATORY_CARE_PROVIDER_SITE_OTHER): Payer: Medicare HMO

## 2021-05-07 DIAGNOSIS — Z0000 Encounter for general adult medical examination without abnormal findings: Secondary | ICD-10-CM

## 2021-05-07 NOTE — Patient Instructions (Signed)
Rebecca Sandoval , Thank you for taking time to come for your Medicare Wellness Visit. I appreciate your ongoing commitment to your health goals. Please review the following plan we discussed and let me know if I can assist you in the future.   Screening recommendations/referrals: Colonoscopy: no longer required  Mammogram: no longer required  Bone Density: scheduled 07/11/2021 Recommended yearly ophthalmology/optometry visit for glaucoma screening and checkup Recommended yearly dental visit for hygiene and checkup  Vaccinations: Influenza vaccine: completed  Pneumococcal vaccine: completed  Tdap vaccine: allergy  Shingles vaccine: will consider     Advanced directives: yes   Conditions/risks identified: none   Next appointment: none   Preventive Care 84 Years and Older, Female Preventive care refers to lifestyle choices and visits with your health care provider that can promote health and wellness. What does preventive care include? A yearly physical exam. This is also called an annual well check. Dental exams once or twice a year. Routine eye exams. Ask your health care provider how often you should have your eyes checked. Personal lifestyle choices, including: Daily care of your teeth and gums. Regular physical activity. Eating a healthy diet. Avoiding tobacco and drug use. Limiting alcohol use. Practicing safe sex. Taking low-dose aspirin every day. Taking vitamin and mineral supplements as recommended by your health care provider. What happens during an annual well check? The services and screenings done by your health care provider during your annual well check will depend on your age, overall health, lifestyle risk factors, and family history of disease. Counseling  Your health care provider may ask you questions about your: Alcohol use. Tobacco use. Drug use. Emotional well-being. Home and relationship well-being. Sexual activity. Eating habits. History of  falls. Memory and ability to understand (cognition). Work and work Statistician. Reproductive health. Screening  You may have the following tests or measurements: Height, weight, and BMI. Blood pressure. Lipid and cholesterol levels. These may be checked every 5 years, or more frequently if you are over 30 years old. Skin check. Lung cancer screening. You may have this screening every year starting at age 58 if you have a 30-pack-year history of smoking and currently smoke or have quit within the past 15 years. Fecal occult blood test (FOBT) of the stool. You may have this test every year starting at age 55. Flexible sigmoidoscopy or colonoscopy. You may have a sigmoidoscopy every 5 years or a colonoscopy every 10 years starting at age 62. Hepatitis C blood test. Hepatitis B blood test. Sexually transmitted disease (STD) testing. Diabetes screening. This is done by checking your blood sugar (glucose) after you have not eaten for a while (fasting). You may have this done every 1-3 years. Bone density scan. This is done to screen for osteoporosis. You may have this done starting at age 102. Mammogram. This may be done every 1-2 years. Talk to your health care provider about how often you should have regular mammograms. Talk with your health care provider about your test results, treatment options, and if necessary, the need for more tests. Vaccines  Your health care provider may recommend certain vaccines, such as: Influenza vaccine. This is recommended every year. Tetanus, diphtheria, and acellular pertussis (Tdap, Td) vaccine. You may need a Td booster every 10 years. Zoster vaccine. You may need this after age 44. Pneumococcal 13-valent conjugate (PCV13) vaccine. One dose is recommended after age 25. Pneumococcal polysaccharide (PPSV23) vaccine. One dose is recommended after age 60. Talk to your health care provider about which screenings  and vaccines you need and how often you need  them. This information is not intended to replace advice given to you by your health care provider. Make sure you discuss any questions you have with your health care provider. Document Released: 03/30/2015 Document Revised: 11/21/2015 Document Reviewed: 01/02/2015 Elsevier Interactive Patient Education  2017 [employer] Prevention in the Home Falls can cause injuries. They can happen to people of all ages. There are many things you can do to make your home safe and to help prevent falls. What can I do on the outside of my home? Regularly fix the edges of walkways and driveways and fix any cracks. Remove anything that might make you trip as you walk through a door, such as a raised step or threshold. Trim any bushes or trees on the path to your home. Use bright outdoor lighting. Clear any walking paths of anything that might make someone trip, such as rocks or tools. Regularly check to see if handrails are loose or broken. Make sure that both sides of any steps have handrails. Any raised decks and porches should have guardrails on the edges. Have any leaves, snow, or ice cleared regularly. Use sand or salt on walking paths during winter. Clean up any spills in your garage right away. This includes oil or grease spills. What can I do in the bathroom? Use night lights. Install grab bars by the toilet and in the tub and shower. Do not use towel bars as grab bars. Use non-skid mats or decals in the tub or shower. If you need to sit down in the shower, use a plastic, non-slip stool. Keep the floor dry. Clean up any water that spills on the floor as soon as it happens. Remove soap buildup in the tub or shower regularly. Attach bath mats securely with double-sided non-slip rug tape. Do not have throw rugs and other things on the floor that can make you trip. What can I do in the bedroom? Use night lights. Make sure that you have a light by your bed that is easy to reach. Do not use  any sheets or blankets that are too big for your bed. They should not hang down onto the floor. Have a firm chair that has side arms. You can use this for support while you get dressed. Do not have throw rugs and other things on the floor that can make you trip. What can I do in the kitchen? Clean up any spills right away. Avoid walking on wet floors. Keep items that you use a lot in easy-to-reach places. If you need to reach something above you, use a strong step stool that has a grab bar. Keep electrical cords out of the way. Do not use floor polish or wax that makes floors slippery. If you must use wax, use non-skid floor wax. Do not have throw rugs and other things on the floor that can make you trip. What can I do with my stairs? Do not leave any items on the stairs. Make sure that there are handrails on both sides of the stairs and use them. Fix handrails that are broken or loose. Make sure that handrails are as long as the stairways. Check any carpeting to make sure that it is firmly attached to the stairs. Fix any carpet that is loose or worn. Avoid having throw rugs at the top or bottom of the stairs. If you do have throw rugs, attach them to the floor with carpet tape.  Make sure that you have a light switch at the top of the stairs and the bottom of the stairs. If you do not have them, ask someone to add them for you. What else can I do to help prevent falls? Wear shoes that: Do not have high heels. Have rubber bottoms. Are comfortable and fit you well. Are closed at the toe. Do not wear sandals. If you use a stepladder: Make sure that it is fully opened. Do not climb a closed stepladder. Make sure that both sides of the stepladder are locked into place. Ask someone to hold it for you, if possible. Clearly mark and make sure that you can see: Any grab bars or handrails. First and last steps. Where the edge of each step is. Use tools that help you move around (mobility aids)  if they are needed. These include: Canes. Walkers. Scooters. Crutches. Turn on the lights when you go into a dark area. Replace any light bulbs as soon as they burn out. Set up your furniture so you have a clear path. Avoid moving your furniture around. If any of your floors are uneven, fix them. If there are any pets around you, be aware of where they are. Review your medicines with your doctor. Some medicines can make you feel dizzy. This can increase your chance of falling. Ask your doctor what other things that you can do to help prevent falls. This information is not intended to replace advice given to you by your health care provider. Make sure you discuss any questions you have with your health care provider. Document Released: 12/28/2008 Document Revised: 08/09/2015 Document Reviewed: 04/07/2014 Elsevier Interactive Patient Education  2017 [employer].

## 2021-05-07 NOTE — Progress Notes (Signed)
Subjective:   Rebecca Sandoval is a 86 y.o. female who presents for Medicare Annual (Subsequent) preventive examination.  Review of Systems     Cardiac Risk Factors include: advanced age (>65men, >3 women);hypertension;dyslipidemia     Objective:    Today's Vitals   05/07/21 1505  PainSc: 4    There is no height or weight on file to calculate BMI.  Advanced Directives 05/07/2021 03/01/2021 07/24/2014  Does Patient Have a Medical Advance Directive? Yes Yes No  Type of Estate agent of [place];Living will Healthcare Power of [place];Out of facility DNR (pink MOST or yellow form);Living will -  Copy of Healthcare Power of Attorney in Chart? No - copy requested - -  Would patient like information on creating a medical advance directive? - - No - patient declined information    Current Medications (verified) Outpatient Encounter Medications as of 05/07/2021  Medication Sig   acetaminophen (TYLENOL) 325 MG tablet Take 650 mg by mouth every 6 (six) hours as needed (pain).   flecainide (TAMBOCOR) 50 MG tablet TAKE 1 TABLET BY MOUTH  TWICE DAILY   metoprolol succinate (TOPROL-XL) 25 MG 24 hr tablet TAKE ONE-HALF TABLET BY  MOUTH DAILY   ondansetron (ZOFRAN-ODT) 4 MG disintegrating tablet Take 4 mg by mouth every 6 (six) hours as needed for nausea/vomiting.   PARoxetine (PAXIL) 20 MG tablet Take 1 tablet (20 mg total) by mouth every morning.   triamterene-hydrochlorothiazide (MAXZIDE-25) 37.5-25 MG tablet TAKE 1 TABLET BY MOUTH  DAILY   warfarin (COUMADIN) 5 MG tablet TAKE 1 AND 1/2 TABLETS DAILY AS DIRECTED  BY THE COUMADIN CLINIC   No facility-administered encounter medications on file as of 05/07/2021.    Allergies (verified) Prednisone, Tetanus toxoids, Alprazolam, and Septra [sulfamethoxazole-trimethoprim]   History: Past Medical History:  Diagnosis Date   Anxiety    Complication of anesthesia    Difficult to wake up.   Diverticulitis    GERD  (gastroesophageal reflux disease)    Hemorrhoid    Obesity    Paroxysmal atrial fibrillation (HCC)    Tachycardia-bradycardia [facility])    Past Surgical History:  Procedure Laterality Date   ABDOMINAL HYSTERECTOMY  03/17/1973   APPENDECTOMY     bartholyn cyst  03/18/1971   BILATERAL TEMPOROMANDIBULAR JOINT ARTHROPLASTY  03/18/1975   BREAST LUMPECTOMY Left 07/28/2014   BREAST LUMPECTOMY WITH RADIOACTIVE SEED LOCALIZATION Left 07/28/2014   Procedure: LEFT BREAST LUMPECTOMY WITH RADIOACTIVE SEED LOCALIZATION;  Surgeon: Almond Lint, MD;  Location: [facility] OR;  Service: General;  Laterality: Left;   diverticulitis  03/17/1984   HERNIA REPAIR  03/17/1994   hiatal   SHOULDER SURGERY     left 97, rigth 98   TONSILLECTOMY     Family History  Problem Relation Age of Onset   Stroke Mother 51   Cancer Paternal Aunt    Cancer Paternal Uncle    Social History   Socioeconomic History   Marital status: Married    Spouse name: Sharma Covert   Number of children: 2   Years of education: Not on file   Highest education level: GED or equivalent  Occupational History    Comment: Retired  Tobacco Use   Smoking status: Former   Smokeless tobacco: Never  Building services engineer Use: Never used  Substance and Sexual Activity   Alcohol use: No   Drug use: No   Sexual activity: Yes  Other Topics Concern   Not on file  Social History Narrative   Pt  lives in [place] with spouse      Left handed    Lives in a one story home   Social Determinants of Health   Financial Resource Strain: Low Risk    Difficulty of Paying Living Expenses: Not hard at all  Food Insecurity: No Food Insecurity   Worried About Charity fundraiser in the Last Year: Never true   Arboriculturist in the Last Year: Never true  Transportation Needs: No Transportation Needs   Lack of Transportation (Medical): No   Lack of Transportation (Non-Medical): No  Physical Activity: Inactive   Days of Exercise per Week: 0 days   Minutes  of Exercise per Session: 0 min  Stress: No Stress Concern Present   Feeling of Stress : Not at all  Social Connections: Moderately Isolated   Frequency of Communication with Friends and Family: Twice a week   Frequency of Social Gatherings with Friends and Family: Twice a week   Attends Religious Services: Never   Marine scientist or Organizations: No   Attends Music therapist: Never   Marital Status: Married    Tobacco Counseling Counseling given: Not Answered   Clinical Intake:  Pre-visit preparation completed: Yes  Pain : 0-10 Pain Score: 4  Pain Type: Chronic pain Pain Location: Knee Pain Orientation: Right Pain Descriptors / Indicators: Constant Pain Onset: More than a month ago Pain Frequency: Constant Pain Relieving Factors: tylenol  Pain Relieving Factors: tylenol  Nutritional Risks: None Diabetes: No  How often do you need to have someone help you when you read instructions, pamphlets, or other written materials from your doctor or pharmacy?: 1 - Never What is the last grade level you completed in school?: GED  Diabetic?no   Interpreter Needed?: No  Information entered by :: L.Tanielle Emigh,LPN   Activities of Daily Living In your present state of health, do you have any difficulty performing the following activities: 05/07/2021  Hearing? N  Vision? N  Difficulty concentrating or making decisions? N  Walking or climbing stairs? N  Dressing or bathing? N  Doing errands, shopping? N  Preparing Food and eating ? N  Using the Toilet? N  In the past six months, have you accidently leaked urine? N  Do you have problems with loss of bowel control? N  Managing your Medications? N  Managing your Finances? N  Housekeeping or managing your Housekeeping? N  Some recent data might be hidden    Patient Care Team: [name], MD as PCP - General (Family Medicine) Belva Crome, MD as Consulting Physician (Cardiology) Germaine Pomfret,  [facility] as Pharmacist (Pharmacist) Alda Berthold, DO as Consulting Physician (Neurology) Earlie Server, MD as Consulting Physician (Orthopedic Surgery)  Indicate any recent Medical Services you may have received from other than [facility] providers in the past year (date may be approximate).     Assessment:   This is a routine wellness examination for [place].  Hearing/Vision screen Vision Screening - Comments:: Annual eye exams wears glasses   Dietary issues and exercise activities discussed: Current Exercise Habits: The patient does not participate in regular exercise at present, Exercise limited by: cardiac condition(s);orthopedic condition(s)   Goals Addressed   None    Depression Screen PHQ 2/9 Scores 05/07/2021 05/07/2021 07/10/2020 04/11/2020  PHQ - 2 Score 0 0 0 1    Fall Risk Fall Risk  05/07/2021 03/01/2021 10/23/2020 04/11/2020  Falls in the past year? 0 0 0 0  Number falls  in past yr: 0 0 0 -  Injury with Fall? 0 0 0 -  Risk for fall due to : No Fall Risks - No Fall Risks -  Follow up Falls evaluation completed - - -    FALL RISK PREVENTION PERTAINING TO THE HOME:  Any stairs in or around the home? No  If so, are there any without handrails? No  Home free of loose throw rugs in walkways, pet beds, electrical cords, etc? Yes  Adequate lighting in your home to reduce risk of falls? Yes   ASSISTIVE DEVICES UTILIZED TO PREVENT FALLS:  Life alert? No  Use of a cane, walker or w/c? No  Grab bars in the bathroom? No  Shower chair or bench in shower? No  Elevated toilet seat or a handicapped toilet? No     Cognitive Function:  Normal cognitive status assessed by direct observation by this Nurse Health Advisor. No abnormalities found.        Immunizations Immunization History  Administered Date(s) Administered   Fluad Quad(high Dose 65+) 11/27/2018   Hepatitis B 10/19/2017   Hepatitis B, adult 11/23/2017   Influenza Split 02/17/2011, 11/28/2013   Influenza, High  Dose Seasonal PF 01/13/2015, 12/20/2016, 11/23/2017   Influenza,inj,quad, With Preservative 01/19/2016   Influenza,trivalent, recombinat, inj, PF 12/02/2011, 12/22/2012   Influenza-Unspecified 12/25/2019, 02/15/2021   PFIZER(Purple Top)SARS-COV-2 Vaccination 05/08/2019, 05/31/2019, 12/16/2019   Pneumococcal Conjugate-13 03/24/2014   Pneumococcal Polysaccharide-23 01/15/2002    TDAP status: Due, Education has been provided regarding the importance of this vaccine. Advised may receive this vaccine at local pharmacy or Health Dept. Aware to provide a copy of the vaccination record if obtained from local pharmacy or Health Dept. Verbalized acceptance and understanding.  Flu Vaccine status: Up to date  Pneumococcal vaccine status: Up to date  Covid-19 vaccine status: Completed vaccines  Qualifies for Shingles Vaccine? Yes   Zostavax completed No   Shingrix Completed?: No.    Education has been provided regarding the importance of this vaccine. Patient has been advised to call insurance company to determine out of pocket expense if they have not yet received this vaccine. Advised may also receive vaccine at local pharmacy or Health Dept. Verbalized acceptance and understanding.  Screening Tests Health Maintenance  Topic Date Due   Zoster Vaccines- Shingrix (1 of 2) Never done   Pneumonia Vaccine 36+ Years old (3) 03/25/2015   COVID-19 Vaccine (4 - Booster for [employer] series) 02/10/2020   INFLUENZA VACCINE  Completed   DEXA SCAN  Completed   HPV VACCINES  Aged Out   TETANUS/TDAP  Discontinued    Health Maintenance  Health Maintenance Due  Topic Date Due   Zoster Vaccines- Shingrix (1 of 2) Never done   Pneumonia Vaccine 35+ Years old (96) 03/25/2015   COVID-19 Vaccine (4 - Booster for [place] series) 02/10/2020    Colorectal cancer screening: No longer required.   Mammogram status: No longer required due to age.  Bone Density status: Completed scheduled 07/11/2021. Results  reflect: Bone density results: OSTEOPENIA. Repeat every 5 years.  Lung Cancer Screening: (Low Dose CT Chest recommended if Age 54-80 years, 30 pack-year currently smoking OR have quit w/in 15years.) does not qualify.   Lung Cancer Screening Referral: n/a  Additional Screening:  Hepatitis C Screening: does not qualify;   Vision Screening: Recommended annual ophthalmology exams for early detection of glaucoma and other disorders of the eye. Is the patient up to date with their annual eye exam?  Yes  Who  is the provider or what is the name of the office in which the patient attends annual eye exams? Dr.Poudyal  If pt is not established with a provider, would they like to be referred to a provider to establish care? No .   Dental Screening: Recommended annual dental exams for proper oral hygiene  Community Resource Referral / Chronic Care Management: CRR required this visit?  No   CCM required this visit?  No      Plan:     I have personally reviewed and noted the following in the patients chart:   Medical and social history Use of alcohol, tobacco or illicit drugs  Current medications and supplements including opioid prescriptions.  Functional ability and status Nutritional status Physical activity Advanced directives List of other physicians Hospitalizations, surgeries, and ER visits in previous 12 months Vitals Screenings to include cognitive, depression, and falls Referrals and appointments  In addition, I have reviewed and discussed with patient certain preventive protocols, quality metrics, and best practice recommendations. A written personalized care plan for preventive services as well as general preventive health recommendations were provided to patient.     Randel Pigg, LPN   05/24/4074   Nurse Notes: none

## 2021-05-28 DIAGNOSIS — M25561 Pain in right knee: Secondary | ICD-10-CM | POA: Diagnosis not present

## 2021-06-01 DIAGNOSIS — M25561 Pain in right knee: Secondary | ICD-10-CM | POA: Diagnosis not present

## 2021-06-04 ENCOUNTER — Telehealth: Payer: Self-pay

## 2021-06-04 DIAGNOSIS — M25561 Pain in right knee: Secondary | ICD-10-CM | POA: Diagnosis not present

## 2021-06-04 NOTE — Telephone Encounter (Signed)
? ?  Pre-operative Risk Assessment  ?  ?Patient Name: Rebecca Sandoval  ?DOB: 12/27/1935 ?MRN: [id number]  ? ?  ? ?Request for Surgical Clearance   ? ?Procedure:   Right knee scope partial medial menisectomy ? ?Date of Surgery:  Clearance TBD                              ?   ?Surgeon:  Earlie Server, MD ?Surgeon's Group or Practice Name:  [facility] ?Phone number:  [phone] [id number] ?Fax number:  [phone] ?  ?Type of Clearance Requested:   ?- Medical  ?- Pharmacy:  Hold Warfarin (Coumadin)   ?  ?Type of Anesthesia:   Choice ?  ?Additional requests/questions:     ? ?Signed, ?Mannie Ohlin   ?06/04/2021, 2:52 PM  ? ?

## 2021-06-05 NOTE — Progress Notes (Signed)
?Cardiology Office Note:   ? ?Date:  06/07/2021  ? ?ID:  Rebecca Sandoval, DOB February 12, 1936, MRN [id number] ? ?PCP:  [name], MD  ?Cardiologist:  None  ? ?Referring MD: [name], MD  ? ?Chief Complaint  ?Patient presents with  ? Atrial Fibrillation  ? ? ?History of Present Illness:   ? ?Rebecca Sandoval is a 86 y.o. female with a hx of paroxysmal atrial fibrillation, tachybrady syndrome, anticoagulation, and hypertension. ?  ?She has upcoming knee surgery and requests cardiac clearance. ? ?She is doing well.  No syncope.  No palpitations or active episodes of atrial fibrillation.  She denies orthopnea PND.  She is somewhat debilitated by right knee meniscus tear and will have surgery done by Dr. French Ana. ? ?Past Medical History:  ?Diagnosis Date  ? Anxiety   ? Complication of anesthesia   ? Difficult to wake up.  ? Diverticulitis   ? GERD (gastroesophageal reflux disease)   ? Hemorrhoid   ? Obesity   ? Paroxysmal atrial fibrillation (HCC)   ? Tachycardia-bradycardia [facility])   ? ? ?Past Surgical History:  ?Procedure Laterality Date  ? ABDOMINAL HYSTERECTOMY  03/17/1973  ? APPENDECTOMY    ? bartholyn cyst  03/18/1971  ? BILATERAL TEMPOROMANDIBULAR JOINT ARTHROPLASTY  03/18/1975  ? BREAST LUMPECTOMY Left 07/28/2014  ? BREAST LUMPECTOMY WITH RADIOACTIVE SEED LOCALIZATION Left 07/28/2014  ? Procedure: LEFT BREAST LUMPECTOMY WITH RADIOACTIVE SEED LOCALIZATION;  Surgeon: [name], MD;  Location: [place];  Service: General;  Laterality: Left;  ? diverticulitis  03/17/1984  ? HERNIA REPAIR  03/17/1994  ? hiatal  ? SHOULDER SURGERY    ? left 97, rigth 98  ? TONSILLECTOMY    ? ? ?Current Medications: ?Current Meds  ?Medication Sig  ? acetaminophen (TYLENOL) 325 MG tablet Take 650 mg by mouth every 6 (six) hours as needed (pain).  ? flecainide (TAMBOCOR) 50 MG tablet TAKE 1 TABLET BY MOUTH  TWICE DAILY  ? ondansetron (ZOFRAN-ODT) 4 MG disintegrating tablet Take 4 mg by mouth every 6 (six) hours as needed for  nausea/vomiting.  ? PARoxetine (PAXIL) 20 MG tablet Take 1 tablet (20 mg total) by mouth every morning.  ? triamterene-hydrochlorothiazide (MAXZIDE-25) 37.5-25 MG tablet TAKE 1 TABLET BY MOUTH  DAILY  ? warfarin (COUMADIN) 5 MG tablet TAKE 1 AND 1/2 TABLETS DAILY AS DIRECTED  BY THE COUMADIN CLINIC  ? [DISCONTINUED] metoprolol succinate (TOPROL-XL) 25 MG 24 hr tablet TAKE ONE-HALF TABLET BY  MOUTH DAILY  ?  ? ?Allergies:   Prednisone, Tetanus toxoids, Alprazolam, and Septra [sulfamethoxazole-trimethoprim]  ? ?Social History  ? ?Socioeconomic History  ? Marital status: Married  ?  Spouse name: Mariea Clonts  ? Number of children: 2  ? Years of education: Not on file  ? Highest education level: GED or equivalent  ?Occupational History  ?  Comment: Retired  ?Tobacco Use  ? Smoking status: Former  ? Smokeless tobacco: Never  ?Vaping Use  ? Vaping Use: Never used  ?Substance and Sexual Activity  ? Alcohol use: No  ? Drug use: No  ? Sexual activity: Yes  ?Other Topics Concern  ? Not on file  ?Social History Narrative  ? Pt lives in [place] with spouse  ?   ? Left handed   ? Lives in a one story home  ? ?Social Determinants of Health  ? ?Financial Resource Strain: Low Risk   ? Difficulty of Paying Living Expenses: Not hard at all  ?Food Insecurity: No Food  Insecurity  ? Worried About Programme researcher, broadcasting/film/video in the Last Year: Never true  ? Ran Out of Food in the Last Year: Never true  ?Transportation Needs: No Transportation Needs  ? Lack of Transportation (Medical): No  ? Lack of Transportation (Non-Medical): No  ?Physical Activity: Inactive  ? Days of Exercise per Week: 0 days  ? Minutes of Exercise per Session: 0 min  ?Stress: No Stress Concern Present  ? Feeling of Stress : Not at all  ?Social Connections: Moderately Isolated  ? Frequency of Communication with Friends and Family: Twice a week  ? Frequency of Social Gatherings with Friends and Family: Twice a week  ? Attends Religious Services: Never  ? Active Member of Clubs or  Organizations: No  ? Attends Banker Meetings: Never  ? Marital Status: Married  ?  ? ?Family History: ?The patient's family history includes Cancer in her paternal aunt and paternal uncle; Stroke (age of onset: 30) in her mother. ? ?ROS:   ?Please see the history of present illness.    ?No angina.  No syncope.  No edema.  All other systems reviewed and are negative. ? ?EKGs/Labs/Other Studies Reviewed:   ? ?The following studies were reviewed today: ?No recent data. ? ?EKG:  EKG when performed 12/01/2019, sinus bradycardia, first-degree AV block.  Today's tracing reveals marked sinus bradycardia and is otherwise normal.  PR interval was 232 ms.  Since last tracing heart rate is slower and PR interval is longer. ? ?Recent Labs: ?10/24/2020: BUN 22; Creatinine, Ser 1.46; Potassium 4.8; Sodium 140  ?Recent Lipid Panel ?   ?Component Value Date/Time  ? CHOL 192 10/24/2020 0949  ? TRIG 235.0 (H) 10/24/2020 0949  ? HDL 40.50 10/24/2020 0949  ? CHOLHDL 5 10/24/2020 0949  ? VLDL 47.0 (H) 10/24/2020 0949  ? LDLDIRECT 115.0 10/24/2020 0949  ? ? ?Physical Exam:   ? ?VS:  BP 122/72   Pulse (!) 47   Ht 5\' 6"  (1.676 m)   Wt 177 lb 6.4 oz (80.5 kg)   SpO2 96%   BMI 28.63 kg/m?    ? ?Wt Readings from Last 3 Encounters:  ?06/07/21 177 lb 6.4 oz (80.5 kg)  ?04/24/21 179 lb 3.2 oz (81.3 kg)  ?03/01/21 179 lb (81.2 kg)  ?  ? ?GEN: Healthy appearing, slightly overweight.. No acute distress ?HEENT: Normal ?NECK: No JVD. ?LYMPHATICS: No lymphadenopathy ?CARDIAC: No murmur. RRR S4 but no S3 gallop, or edema. ?VASCULAR:  Normal Pulses. No bruits. ?RESPIRATORY:  Clear to auscultation without rales, wheezing or rhonchi  ?ABDOMEN: Soft, non-tender, non-distended, No pulsatile mass, ?MUSCULOSKELETAL: No deformity  ?SKIN: Warm and dry ?NEUROLOGIC:  Alert and oriented x 3 ?PSYCHIATRIC:  Normal affect  ? ?ASSESSMENT:   ? ?1. Paroxysmal atrial fibrillation (HCC)   ?2. Chronic anticoagulation   ?3. Essential hypertension   ?4.  Tachycardia-bradycardia syndrome (HCC)   ?5. Encounter for therapeutic drug monitoring   ?6. Preoperative clearance   ? ?PLAN:   ? ?In order of problems listed above: ? ?Tachybradycardia syndrome with paroxysmal atrial fibrillation being maintained in normal sinus rhythm on Tambocor 50 mg twice daily.  Significant bradycardia noted on today's EKG.  Will discontinue low-dose beta-blocker therapy. ?We will check a hemoglobin today.  Continue Coumadin. ?Blood pressure control is adequate.  Off beta-blocker blood pressure should not be impacted. ?Excessive bradycardia with first-degree AV block noted.  Beta-blocker is being discontinued.  Only on 12.5 mg of Toprol daily.  Continue Tambocor.  In 2 weeks 24 to 48-hour monitor to clarify heart rate on Tambocor. ?Continue Coumadin therapy.  Okay to hold for upcoming knee surgery for at least 72 hours.  Hemoglobin will be obtained today along with INR ?Cleared for upcoming knee surgery. ? ?Clinical follow-up 9 to 12 months. ? ? ?Medication Adjustments/Labs and Tests Ordered: ?Current medicines are reviewed at length with the patient today.  Concerns regarding medicines are outlined above.  ?Orders Placed This Encounter  ?Procedures  ? CBC  ? LONG TERM MONITOR (3-14 DAYS)  ? EKG 12-Lead  ? ?No orders of the defined types were placed in this encounter. ? ? ?  ? ?Signed, ?Sinclair Grooms, MD  ?06/07/2021 9:53 AM    ?[place] ?

## 2021-06-05 NOTE — Telephone Encounter (Signed)
Patient with diagnosis of atrial fibrillation on warfarin for anticoagulation.   ? ?Procedure: right knee scope partial medial menisectomy ?Date of procedure: TBD ? ? ?CHA2DS2-VASc Score = 4  ? This indicates a 4.8% annual risk of stroke. ?The patient's score is based upon: ?CHF History: 0 ?HTN History: 1 ?Diabetes History: 0 ?Stroke History: 0 ?Vascular Disease History: 0 ?Age Score: 2 ?Gender Score: 1 ?  ?CrCl 31 ?Platelet count  NONE SINCE 2016 ? ?Will need repeat CBC to determine warfarin hold ? ?

## 2021-06-06 NOTE — Telephone Encounter (Signed)
Preoperative team, Please contact the patient and let her know that she needs to have blood work drawn so that we may make recommendations on holding her warfarin.  Please order CBC. ? ?Thank you, ? ?Jossie Ng. Sandip Power NP-C ? ?  ?06/06/2021, 9:28 AM ?[place] ?[address] ?Office [phone] Fax [phone] ? ?

## 2021-06-06 NOTE — Telephone Encounter (Signed)
D/w pre op provider today, ok to add pre op clearance needed as well as need CBC per Pharm-d for pre op clearance. I have update appt notes.  ?

## 2021-06-07 ENCOUNTER — Ambulatory Visit (INDEPENDENT_AMBULATORY_CARE_PROVIDER_SITE_OTHER): Payer: Medicare HMO

## 2021-06-07 ENCOUNTER — Ambulatory Visit: Payer: Medicare HMO | Admitting: Interventional Cardiology

## 2021-06-07 ENCOUNTER — Encounter: Payer: Self-pay | Admitting: Interventional Cardiology

## 2021-06-07 ENCOUNTER — Other Ambulatory Visit: Payer: Self-pay

## 2021-06-07 VITALS — BP 122/72 | HR 47 | Ht 66.0 in | Wt 177.4 lb

## 2021-06-07 DIAGNOSIS — Z5181 Encounter for therapeutic drug level monitoring: Secondary | ICD-10-CM

## 2021-06-07 DIAGNOSIS — I4891 Unspecified atrial fibrillation: Secondary | ICD-10-CM | POA: Diagnosis not present

## 2021-06-07 DIAGNOSIS — Z7901 Long term (current) use of anticoagulants: Secondary | ICD-10-CM | POA: Diagnosis not present

## 2021-06-07 DIAGNOSIS — I495 Sick sinus syndrome: Secondary | ICD-10-CM | POA: Diagnosis not present

## 2021-06-07 DIAGNOSIS — I480 Paroxysmal atrial fibrillation: Secondary | ICD-10-CM | POA: Diagnosis not present

## 2021-06-07 DIAGNOSIS — I10 Essential (primary) hypertension: Secondary | ICD-10-CM

## 2021-06-07 DIAGNOSIS — Z01818 Encounter for other preprocedural examination: Secondary | ICD-10-CM

## 2021-06-07 DIAGNOSIS — Z01810 Encounter for preprocedural cardiovascular examination: Secondary | ICD-10-CM

## 2021-06-07 LAB — CBC
Hematocrit: 42 % (ref 34.0–46.6)
Hemoglobin: 14.1 g/dL (ref 11.1–15.9)
MCH: 31.7 pg (ref 26.6–33.0)
MCHC: 33.6 g/dL (ref 31.5–35.7)
MCV: 94 fL (ref 79–97)
Platelets: 196 10*3/uL (ref 150–450)
RBC: 4.45 x10E6/uL (ref 3.77–5.28)
RDW: 13 % (ref 11.7–15.4)
WBC: 6.7 10*3/uL (ref 3.4–10.8)

## 2021-06-07 LAB — POCT INR: INR: 2.1 (ref 2.0–3.0)

## 2021-06-07 NOTE — Progress Notes (Unsigned)
Enrolled patient for a 3 day Zio XT monitor to be mailed to patients home. Patient to wear after 4/7 ?

## 2021-06-07 NOTE — Patient Instructions (Signed)
Continue taking Warfarin 1.5 tablets daily. Recheck INR in 6 weeks.  ?Let us know when you are scheduled for knee surgery. ?Coumadin Clinic [phone].  ?

## 2021-06-07 NOTE — Patient Instructions (Signed)
Medication Instructions:  ?1) DISCONTINUE Metoprolol ? ?*If you need a refill on your cardiac medications before your next appointment, please call your pharmacy* ? ? ?Lab Work: ?CBC today ? ?If you have labs (blood work) drawn today and your tests are completely normal, you will receive your results only by: ?MyChart Message (if you have MyChart) OR ?A paper copy in the mail ?If you have any lab test that is abnormal or we need to change your treatment, we will call you to review the results. ? ? ?Testing/Procedures: ?Your physician recommends that you wear a monitor for 3 days.  We do not want you to put it on until you have been off of the Metoprolol for 2 weeks. ? ? ?Follow-Up: ?At [facility], you and your health needs are our priority.  As part of our continuing mission to provide you with exceptional heart care, we have created designated Provider Care Teams.  These Care Teams include your primary Cardiologist (physician) and Advanced Practice Providers (APPs -  Physician Assistants and Nurse Practitioners) who all work together to provide you with the care you need, when you need it. ? ?We recommend signing up for the patient portal called "MyChart".  Sign up information is provided on this After Visit Summary.  MyChart is used to connect with patients for Virtual Visits (Telemedicine).  Patients are able to view lab/test results, encounter notes, upcoming appointments, etc.  Non-urgent messages can be sent to your provider as well.   ?To learn more about what you can do with MyChart, go to [email or web address].   ? ?Your next appointment:   ?1 year(s) ? ?The format for your next appointment:   ?In Person ? ?Provider:   ?[facility]. Blenda Bridegroom, MD  ? ? ?Other Instructions ? ?ZIO XT- Long Term Monitor Instructions ? ?Your physician has requested you wear a ZIO patch monitor for 3 days.  ?This is a single patch monitor. [employer] supplies one patch monitor per enrollment. Additional ?stickers are not  available. Please do not apply patch if you will be having a Nuclear Stress Test,  ?Echocardiogram, Cardiac CT, MRI, or Chest Xray during the period you would be wearing the  ?monitor. The patch cannot be worn during these tests. You cannot remove and re-apply the  ?ZIO XT patch monitor.  ?Your ZIO patch monitor will be mailed 3 day USPS to your address on file. It may take 3-5 days  ?to receive your monitor after you have been enrolled.  ?Once you have received your monitor, please review the enclosed instructions. Your monitor  ?has already been registered assigning a specific monitor serial # to you. ? ?Billing and Patient Assistance Program Information ? ?We have supplied [employer] with any of your insurance information on file for billing purposes. ?[employer] offers a sliding scale Patient Assistance Program for patients that do not have  ?insurance, or whose insurance does not completely cover the cost of the ZIO monitor.  ?You must apply for the Patient Assistance Program to qualify for this discounted rate.  ?To apply, please call [employer] at [phone], select option 4, select option 2, ask to apply for  ?Patient Assistance Program. Theodore Demark will ask your household income, and how many people  ?are in your household. They will quote your out-of-pocket cost based on that information.  ?[employer] will also be able to set up a 97-month, interest-free payment plan if needed. ? ?Applying the monitor ?  ?Shave hair from upper left chest.  ?Hold abrader disc  by orange tab. Rub abrader in 40 strokes over the upper left chest as  ?indicated in your monitor instructions.  ?Clean area with 4 enclosed alcohol pads. Let dry.  ?Apply patch as indicated in monitor instructions. Patch will be placed under collarbone on left  ?side of chest with arrow pointing upward.  ?Rub patch adhesive wings for 2 minutes. Remove white label marked "1". Remove the white  ?label marked "2". Rub patch adhesive wings for 2 additional minutes.   ?While looking in a mirror, press and release button in center of patch. A small green light will  ?flash 3-4 times. This will be your only indicator that the monitor has been turned on.  ?Do not shower for the first 24 hours. You may shower after the first 24 hours.  ?Press the button if you feel a symptom. You will hear a small click. Record Date, Time and  ?Symptom in the Patient Logbook.  ?When you are ready to remove the patch, follow instructions on the last 2 pages of Patient  ?Logbook. Stick patch monitor onto the last page of Patient Logbook.  ?Place Patient Logbook in the blue and white box. Use locking tab on box and tape box closed  ?securely. The blue and white box has prepaid postage on it. Please place it in the mailbox as  ?soon as possible. Your physician should have your test results approximately 7 days after the  ?monitor has been mailed back to [facility].  ?Call [facility] at [phone] if you have questions regarding  ?your ZIO XT patch monitor. Call them immediately if you see an orange light blinking on your  ?monitor.  ?If your monitor falls off in less than 4 days, contact our Monitor department at [phone].  ?If your monitor becomes loose or falls off after 4 days call [employer] at [phone] for  ?suggestions on securing your monitor ?  ?

## 2021-06-10 ENCOUNTER — Encounter: Payer: Self-pay | Admitting: Family Medicine

## 2021-06-10 ENCOUNTER — Ambulatory Visit (INDEPENDENT_AMBULATORY_CARE_PROVIDER_SITE_OTHER): Payer: Medicare HMO | Admitting: Family Medicine

## 2021-06-10 VITALS — BP 118/70 | HR 72 | Temp 97.0°F | Wt 176.4 lb

## 2021-06-10 DIAGNOSIS — I10 Essential (primary) hypertension: Secondary | ICD-10-CM | POA: Diagnosis not present

## 2021-06-10 DIAGNOSIS — N1832 Chronic kidney disease, stage 3b: Secondary | ICD-10-CM

## 2021-06-10 DIAGNOSIS — I480 Paroxysmal atrial fibrillation: Secondary | ICD-10-CM

## 2021-06-10 DIAGNOSIS — Z7901 Long term (current) use of anticoagulants: Secondary | ICD-10-CM | POA: Diagnosis not present

## 2021-06-10 DIAGNOSIS — I495 Sick sinus syndrome: Secondary | ICD-10-CM

## 2021-06-10 DIAGNOSIS — Z01818 Encounter for other preprocedural examination: Secondary | ICD-10-CM

## 2021-06-10 NOTE — Progress Notes (Signed)
?[facility] ?[facility] ?[address] ?[place] [address] ?Dept: [phone] ?Dept Fax: [phone] ? ?Office Visit ? ?Subjective:  ? ? Patient ID: CASANDRA DALLAIRE, female    DOB: Mar 19, 1935, 86 y.o..   MRN: [id number] ? ?Chief Complaint  ?Patient presents with  ? Pre-op Exam  ?  Pre-Op Clearance for RT knee surgery.   ? ? ?History of Present Illness: ? ?Patient is in today for a pre-operative exam. She has seen Dr. French Ana (orthopedics) who plans a right knee arthroscopy and medial meniscectomy. Ms. Harrigan was recently seen by Dr. Tamala Julian (cardiology), who has provided cardiac clearance. He stopped her metoprolol due to some bradycardia. He has continued her flecainide to keep her out of a. fib. He also provided advice on stopping Ms. Kinsella's Coumadin for 72 hours. ? ?Ms. Arrowood is otherwise in good health. She has no current pulmonary issues. She has a history of hypertension and is managed on Maxzide. She has Stage 3b chronic kidney disease.  ? ?Past Medical History: ?Patient Active Problem List  ? Diagnosis Date Noted  ? Osteoarthritis of right knee 04/24/2021  ? Left palmar fibromatosis 07/10/2020  ? Allergic rhinitis due to pollen 04/11/2020  ? Osteoarthritis of carpometacarpal [facility]) joint of right thumb 04/11/2020  ? History of cancer of left breast 04/11/2020  ? Essential hypertension 04/11/2020  ? Obesity   ? Diverticulitis   ? Hemorrhoid   ? GERD (gastroesophageal reflux disease)   ? Chronic kidney disease, stage 3b ([place]) 04/28/2017  ? GAD (generalized anxiety disorder) 04/27/2017  ? Chronic anticoagulation 05/23/2013  ? Encounter for therapeutic drug monitoring 05/05/2013  ? Paroxysmal atrial fibrillation ([place]) 09/23/2012  ? Tachycardia-bradycardia syndrome ([place]) 09/23/2012  ? Gout 08/11/2011  ? ?Past Surgical History:  ?Procedure Laterality Date  ? ABDOMINAL HYSTERECTOMY  03/17/1973  ? APPENDECTOMY    ? bartholyn cyst  03/18/1971  ? BILATERAL  TEMPOROMANDIBULAR JOINT ARTHROPLASTY  03/18/1975  ? BREAST LUMPECTOMY Left 07/28/2014  ? BREAST LUMPECTOMY WITH RADIOACTIVE SEED LOCALIZATION Left 07/28/2014  ? Procedure: LEFT BREAST LUMPECTOMY WITH RADIOACTIVE SEED LOCALIZATION;  Surgeon: [name], MD;  Location: [place];  Service: General;  Laterality: Left;  ? diverticulitis  03/17/1984  ? HERNIA REPAIR  03/17/1994  ? hiatal  ? SHOULDER SURGERY    ? left 97, rigth 98  ? TONSILLECTOMY    ? ?Family History  ?Problem Relation Age of Onset  ? Stroke Mother 32  ? Cancer Paternal Aunt   ? Cancer Paternal Uncle   ? ?Outpatient Medications Prior to Visit  ?Medication Sig Dispense Refill  ? acetaminophen (TYLENOL) 325 MG tablet Take 650 mg by mouth every 6 (six) hours as needed (pain).    ? flecainide (TAMBOCOR) 50 MG tablet TAKE 1 TABLET BY MOUTH  TWICE DAILY 180 tablet 1  ? ondansetron (ZOFRAN-ODT) 4 MG disintegrating tablet Take 4 mg by mouth every 6 (six) hours as needed for nausea/vomiting.    ? PARoxetine (PAXIL) 20 MG tablet Take 1 tablet (20 mg total) by mouth every morning. 90 tablet 3  ? triamterene-hydrochlorothiazide (MAXZIDE-25) 37.5-25 MG tablet TAKE 1 TABLET BY MOUTH  DAILY 90 tablet 1  ? warfarin (COUMADIN) 5 MG tablet TAKE 1 AND 1/2 TABLETS DAILY AS DIRECTED  BY THE COUMADIN CLINIC 135 tablet 1  ? ?No facility-administered medications prior to visit.  ? ?Allergies  ?Allergen Reactions  ? Prednisone   ?  "pt stated this puts her in an uncomfortable zone"  ? Tetanus  Toxoids Hives  ? Alprazolam Anxiety  ?  Increased agitation  ? Septra [Sulfamethoxazole-Trimethoprim] Rash  ?   ?Objective:  ? ?Today's Vitals  ? 06/10/21 1843  ?BP: 118/70  ?Pulse: 72  ?Temp: (!) 97 ?F (36.1 ?C)  ?SpO2: 94%  ?Weight: 176 lb 6.4 oz (80 kg)  ? ?Body mass index is 28.47 kg/m?.  ? ?General: Well developed, well nourished. No acute distress. ?HEENT: Normocephalic, non-traumatic. PERRL, EOMI. Conjunctiva clear. External ears normal. EAC  ? and TMs normal bilaterally. Nose clear  without congestion or rhinorrhea. Mucous membranes moist.  ? Oropharynx clear. Upper dentures. ?Neck: Supple. No lymphadenopathy. No thyromegaly. ?Lungs: Clear to auscultation bilaterally. No wheezing, rales or rhonchi. ?CV: RRR without murmurs or rubs. Pulses 2+ bilaterally. ?Abdomen: Soft, non-tender. Bowel sounds positive, normal pitch and frequency. No  ? hepatosplenomegaly. No rebound or guarding. ?Extremities: Full ROM. No joint swelling or tenderness. No edema noted. ?Skin: Warm and dry. No rashes. ?Neuro: No focal neurological deficits. ?Psych: Alert and oriented. Normal mood and affect. ? ?Health Maintenance Due  ?Topic Date Due  ? Zoster Vaccines- Shingrix (1 of 2) Never done  ? Pneumonia Vaccine 31+ Years old (23) 03/25/2015  ?   ?Assessment & Plan:  ? ?1. Preoperative examination ?I will reassess her renal function. If stable, I will paln to clear her medically for her arthroscopy. ? ?- Basic metabolic panel ? ?2. Paroxysmal atrial fibrillation (HCC) ?Stable. Apparently in a sinus rhythm on flecainide. ? ?3. Tachycardia-bradycardia syndrome ([place]) ?Normal rate today. ? ?4. Essential hypertension ?Blood pressure at goal. Continue Maxzide 37.5/25 1 tablet daily. ? ?- Basic metabolic panel ? ?5. Chronic anticoagulation ?Last INR at goal (2.7). Dr. Tamala Julian has provided surgical guidance. ? ?Return for As scheduled.  ? ?[name], MD ?

## 2021-06-11 ENCOUNTER — Encounter: Payer: Self-pay | Admitting: Family Medicine

## 2021-06-11 LAB — BASIC METABOLIC PANEL
BUN: 21 mg/dL (ref 6–23)
CO2: 25 mEq/L (ref 19–32)
Calcium: 9.4 mg/dL (ref 8.4–10.5)
Chloride: 103 mEq/L (ref 96–112)
Creatinine, Ser: 1.67 mg/dL — ABNORMAL HIGH (ref 0.40–1.20)
GFR: 28.02 mL/min — ABNORMAL LOW (ref >60.00)
Glucose, Bld: 101 mg/dL — ABNORMAL HIGH (ref 70–99)
Potassium: 5 mEq/L (ref 3.5–5.1)
Sodium: 137 mEq/L (ref 135–145)

## 2021-06-11 NOTE — Telephone Encounter (Signed)
? ?  Patient Name: Rebecca Sandoval  ?DOB: 06-24-1935 ?MRN: [id number] ? ?Primary Cardiologist: Dr. Daneen Schick ? ?Chart reviewed as part of pre-operative protocol coverage.  ? ?Patient was seen by Dr. Tamala Julian in clinic 06/07/21 for surgical clearance evaluation. Per his note, "Cleared for upcoming knee surgery." ? ?Regarding anticoagulation, per pharmD, "Okay to hold warfarin x 5 days for procedure, will not need bridging with Lovenox." ? ?Will route this bundled recommendation with Dr. Thompson Caul office note to requesting provider via Epic fax function. Please call with questions. ? ?Charlie Pitter, PA-C ?06/11/2021, 12:49 PM ? ? ?

## 2021-06-11 NOTE — Telephone Encounter (Signed)
Platelet count 196.   ? ?Most recent labs show SCr increased to 1.7, GFR now at 27. ? ?Okay to hold warfarin x 5 days for procedure, will not need bridging with Lovenox ?

## 2021-06-12 ENCOUNTER — Telehealth: Payer: Self-pay

## 2021-06-12 NOTE — Telephone Encounter (Signed)
Pre-Op clearance form faxed to Ovidio Hanger, AttnClaiborne Billings High @ [phone]. Dm/cma ? ?

## 2021-06-26 ENCOUNTER — Telehealth (HOSPITAL_COMMUNITY): Payer: Self-pay | Admitting: Orthopedic Surgery

## 2021-06-26 DIAGNOSIS — S83241A Other tear of medial meniscus, current injury, right knee, initial encounter: Secondary | ICD-10-CM | POA: Diagnosis not present

## 2021-06-26 DIAGNOSIS — G8918 Other acute postprocedural pain: Secondary | ICD-10-CM | POA: Diagnosis not present

## 2021-06-26 DIAGNOSIS — X58XXXA Exposure to other specified factors, initial encounter: Secondary | ICD-10-CM | POA: Diagnosis not present

## 2021-06-26 DIAGNOSIS — M958 Other specified acquired deformities of musculoskeletal system: Secondary | ICD-10-CM | POA: Diagnosis not present

## 2021-06-26 DIAGNOSIS — S83281A Other tear of lateral meniscus, current injury, right knee, initial encounter: Secondary | ICD-10-CM | POA: Diagnosis not present

## 2021-06-26 DIAGNOSIS — M1712 Unilateral primary osteoarthritis, left knee: Secondary | ICD-10-CM | POA: Diagnosis not present

## 2021-06-26 DIAGNOSIS — S83272A Complex tear of lateral meniscus, current injury, left knee, initial encounter: Secondary | ICD-10-CM | POA: Diagnosis not present

## 2021-06-26 DIAGNOSIS — S83232A Complex tear of medial meniscus, current injury, left knee, initial encounter: Secondary | ICD-10-CM | POA: Diagnosis not present

## 2021-06-26 DIAGNOSIS — M948X6 Other specified disorders of cartilage, lower leg: Secondary | ICD-10-CM | POA: Diagnosis not present

## 2021-06-26 DIAGNOSIS — Y999 Unspecified external cause status: Secondary | ICD-10-CM | POA: Diagnosis not present

## 2021-06-26 MED ORDER — HYDROCODONE-ACETAMINOPHEN 5-325 MG PO TABS: 1.0000 | ORAL_TABLET | ORAL | 0 refills | Status: AC | PRN

## 2021-06-26 NOTE — Telephone Encounter (Signed)
Had surgery with Dr. French Ana today. Unable to pick up norco rx from [facility]. Resent Rx to [facility] in [place]. ?

## 2021-07-04 DIAGNOSIS — S83241D Other tear of medial meniscus, current injury, right knee, subsequent encounter: Secondary | ICD-10-CM | POA: Diagnosis not present

## 2021-07-04 DIAGNOSIS — S83281D Other tear of lateral meniscus, current injury, right knee, subsequent encounter: Secondary | ICD-10-CM | POA: Diagnosis not present

## 2021-07-11 ENCOUNTER — Inpatient Hospital Stay: Admission: RE | Admit: 2021-07-11 | Payer: Medicare Other | Source: Ambulatory Visit

## 2021-07-11 DIAGNOSIS — I480 Paroxysmal atrial fibrillation: Secondary | ICD-10-CM | POA: Diagnosis not present

## 2021-07-19 DIAGNOSIS — I480 Paroxysmal atrial fibrillation: Secondary | ICD-10-CM | POA: Diagnosis not present

## 2021-07-31 ENCOUNTER — Ambulatory Visit: Payer: Medicare HMO | Admitting: *Deleted

## 2021-07-31 DIAGNOSIS — Z5181 Encounter for therapeutic drug level monitoring: Secondary | ICD-10-CM

## 2021-07-31 DIAGNOSIS — I480 Paroxysmal atrial fibrillation: Secondary | ICD-10-CM

## 2021-07-31 DIAGNOSIS — I4891 Unspecified atrial fibrillation: Secondary | ICD-10-CM | POA: Diagnosis not present

## 2021-07-31 LAB — POCT INR: INR: 2.1 (ref 2.0–3.0)

## 2021-07-31 NOTE — Patient Instructions (Signed)
Description   ?Continue taking Warfarin 1.5 tablets daily. Recheck INR in 6 weeks. Coumadin Clinic [phone].  ?  ?  ?

## 2021-08-05 DIAGNOSIS — S83241D Other tear of medial meniscus, current injury, right knee, subsequent encounter: Secondary | ICD-10-CM | POA: Diagnosis not present

## 2021-08-05 DIAGNOSIS — S83281D Other tear of lateral meniscus, current injury, right knee, subsequent encounter: Secondary | ICD-10-CM | POA: Diagnosis not present

## 2021-08-20 ENCOUNTER — Emergency Department (HOSPITAL_COMMUNITY)
Admission: EM | Admit: 2021-08-20 | Discharge: 2021-08-20 | Disposition: A | Discharge status: Home / Self Care | Payer: Medicare HMO | Attending: Student | Admitting: Student

## 2021-08-20 ENCOUNTER — Emergency Department (HOSPITAL_COMMUNITY): Payer: Medicare HMO

## 2021-08-20 DIAGNOSIS — W19XXXA Unspecified fall, initial encounter: Secondary | ICD-10-CM | POA: Diagnosis not present

## 2021-08-20 DIAGNOSIS — I129 Hypertensive chronic kidney disease with stage 1 through stage 4 chronic kidney disease, or unspecified chronic kidney disease: Secondary | ICD-10-CM | POA: Diagnosis not present

## 2021-08-20 DIAGNOSIS — S82831A Other fracture of upper and lower end of right fibula, initial encounter for closed fracture: Secondary | ICD-10-CM | POA: Diagnosis not present

## 2021-08-20 DIAGNOSIS — S52091A Other fracture of upper end of right ulna, initial encounter for closed fracture: Secondary | ICD-10-CM | POA: Diagnosis not present

## 2021-08-20 DIAGNOSIS — S0990XA Unspecified injury of head, initial encounter: Secondary | ICD-10-CM | POA: Diagnosis not present

## 2021-08-20 DIAGNOSIS — Z7901 Long term (current) use of anticoagulants: Secondary | ICD-10-CM | POA: Diagnosis not present

## 2021-08-20 DIAGNOSIS — S199XXA Unspecified injury of neck, initial encounter: Secondary | ICD-10-CM | POA: Diagnosis not present

## 2021-08-20 DIAGNOSIS — I480 Paroxysmal atrial fibrillation: Secondary | ICD-10-CM | POA: Diagnosis not present

## 2021-08-20 DIAGNOSIS — M25551 Pain in right hip: Secondary | ICD-10-CM | POA: Diagnosis not present

## 2021-08-20 DIAGNOSIS — N184 Chronic kidney disease, stage 4 (severe): Secondary | ICD-10-CM | POA: Insufficient documentation

## 2021-08-20 DIAGNOSIS — S50311A Abrasion of right elbow, initial encounter: Secondary | ICD-10-CM | POA: Diagnosis not present

## 2021-08-20 DIAGNOSIS — S59911A Unspecified injury of right forearm, initial encounter: Secondary | ICD-10-CM | POA: Diagnosis not present

## 2021-08-20 DIAGNOSIS — S0101XA Laceration without foreign body of scalp, initial encounter: Secondary | ICD-10-CM | POA: Insufficient documentation

## 2021-08-20 DIAGNOSIS — Z79899 Other long term (current) drug therapy: Secondary | ICD-10-CM | POA: Diagnosis not present

## 2021-08-20 DIAGNOSIS — S90511A Abrasion, right ankle, initial encounter: Secondary | ICD-10-CM | POA: Insufficient documentation

## 2021-08-20 DIAGNOSIS — M25571 Pain in right ankle and joints of right foot: Secondary | ICD-10-CM | POA: Diagnosis not present

## 2021-08-20 DIAGNOSIS — M25521 Pain in right elbow: Secondary | ICD-10-CM | POA: Diagnosis not present

## 2021-08-20 DIAGNOSIS — I10 Essential (primary) hypertension: Secondary | ICD-10-CM | POA: Diagnosis not present

## 2021-08-20 DIAGNOSIS — W109XXA Fall (on) (from) unspecified stairs and steps, initial encounter: Secondary | ICD-10-CM | POA: Diagnosis not present

## 2021-08-20 DIAGNOSIS — I4891 Unspecified atrial fibrillation: Secondary | ICD-10-CM | POA: Diagnosis not present

## 2021-08-20 DIAGNOSIS — Z043 Encounter for examination and observation following other accident: Secondary | ICD-10-CM | POA: Diagnosis not present

## 2021-08-20 DIAGNOSIS — S299XXA Unspecified injury of thorax, initial encounter: Secondary | ICD-10-CM | POA: Diagnosis not present

## 2021-08-20 DIAGNOSIS — M47812 Spondylosis without myelopathy or radiculopathy, cervical region: Secondary | ICD-10-CM | POA: Diagnosis not present

## 2021-08-20 DIAGNOSIS — M160 Bilateral primary osteoarthritis of hip: Secondary | ICD-10-CM | POA: Diagnosis not present

## 2021-08-20 DIAGNOSIS — G319 Degenerative disease of nervous system, unspecified: Secondary | ICD-10-CM | POA: Diagnosis not present

## 2021-08-20 DIAGNOSIS — I739 Peripheral vascular disease, unspecified: Secondary | ICD-10-CM | POA: Diagnosis not present

## 2021-08-20 DIAGNOSIS — Z743 Need for continuous supervision: Secondary | ICD-10-CM | POA: Diagnosis not present

## 2021-08-20 NOTE — ED Triage Notes (Addendum)
BIB [facility] from home. Missed step and fell down stairs 0200 this AM. Small lac on R head- clotted upon arrival. On Coumadin-afib.   20ga LH

## 2021-08-20 NOTE — ED Notes (Signed)
1inch lac to right forehead- controlled R- elbow abrasion/bruise R - ankle abrasion/bruise R- Hip bruise. Full ROM all limbs. Aox4.

## 2021-08-20 NOTE — ED Notes (Signed)
Trauma Response Nurse Documentation   Rebecca Sandoval is a 86 y.o. female arriving to [facility] ED via EMS  On warfarin daily. Trauma was activated as a Level 2 by Judson Roch RN based on the following trauma criteria Elderly patients > 65 with head trauma on anti-coagulation (excluding ASA). Trauma team at the bedside on patient arrival. Patient cleared for CT by Dr. Tinnie Gens. Patient to CT with team. GCS 15.  History   Past Medical History:  Diagnosis Date   Anxiety    Complication of anesthesia    Difficult to wake up.   Diverticulitis    GERD (gastroesophageal reflux disease)    Hemorrhoid    Obesity    Paroxysmal atrial fibrillation (HCC)    Tachycardia-bradycardia [facility])      Past Surgical History:  Procedure Laterality Date   ABDOMINAL HYSTERECTOMY  03/17/1973   APPENDECTOMY     bartholyn cyst  03/18/1971   BILATERAL TEMPOROMANDIBULAR JOINT ARTHROPLASTY  03/18/1975   BREAST LUMPECTOMY Left 07/28/2014   BREAST LUMPECTOMY WITH RADIOACTIVE SEED LOCALIZATION Left 07/28/2014   Procedure: LEFT BREAST LUMPECTOMY WITH RADIOACTIVE SEED LOCALIZATION;  Surgeon: [name], MD;  Location: [place];  Service: General;  Laterality: Left;   diverticulitis  03/17/1984   HERNIA REPAIR  03/17/1994   hiatal   SHOULDER SURGERY     left 97, rigth 98   TONSILLECTOMY         Initial Focused Assessment (If applicable, or please see trauma documentation): See event summary  CT's Completed:   CT Head and CT C-Spine   Interventions:  See event summary Plan for disposition:  Discharge home      Event Summary: Patient brought in by [facility] from home, patient complaint of fall at approx 0200 today. Patient stated she was coming up the stairs and fell. Patient main complaint of right hip pain/bruising. Patient ambulatory since the fall. Patient last dose of coumadin last night. VSS. A&Ox4.  20G PIV left hand CT head CT C spin Xray chest, plevis, right elbow, right hip, right ankle  Bedside  handoff with ED RN [name], RN.    Trudee Kuster  Trauma Response RN  Please call TRN at [phone] for further assistance.

## 2021-08-20 NOTE — ED Notes (Signed)
Pt verbalized understanding of d/c instructions, meds, and followup care. Denies questions. VSS, no distress noted. Steady gait to exit with all belongings.  ?

## 2021-08-20 NOTE — Discharge Instructions (Addendum)
Like we discussed, please wear the sling on your right arm as needed for comfort.  There is concern your x-ray of the might have a compression fracture in the region of your right elbow.  Please call your orthopedic doctor tomorrow morning and schedule an appointment for reevaluation.  If you develop any new or worsening symptoms please come back to the emergency department.

## 2021-08-20 NOTE — ED Provider Notes (Signed)
[facility] EMERGENCY DEPARTMENT Provider Note   CSN: [id number] Arrival date & time: 08/20/21  1522     History  Chief Complaint  Patient presents with   Fall    Coumadin    Rebecca Sandoval is a 86 y.o. female.  HPI Patient is an 86 year old female with a history of CKD 4, GAD, hypertension, PAF on warfarin, who presents to the emergency department as a level 2 trauma due to a fall that occurred around 2 AM today.  States that she was bringing her dog down the stairs and tripped on the last stair and fell to the ground.  She landed on her right side and believes that she struck her head.  EMS was initially contacted at that time but patient refused transport.  EMS reached back out to the patient who convinced her to come to the emergency department later in the day.  Reports a laceration to the right scalp, abrasion to the right elbow, as well as an abrasion to the right ankle.  Mild pain noted to the prior mentioned regions.  Denies any neck pain, back pain, chest pain, abdominal pain.  Unsure of syncope but does note that she got up quickly after the fall.    Home Medications Prior to Admission medications   Medication Sig Start Date End Date Taking? Authorizing Provider  acetaminophen (TYLENOL) 325 MG tablet Take 650 mg by mouth every 6 (six) hours as needed (pain).    [provider]  flecainide (TAMBOCOR) 50 MG tablet TAKE 1 TABLET BY MOUTH  TWICE DAILY 12/21/20   Belva Crome, MD  ondansetron (ZOFRAN-ODT) 4 MG disintegrating tablet Take 4 mg by mouth every 6 (six) hours as needed for nausea/vomiting. 07/18/16   [provider]  PARoxetine (PAXIL) 20 MG tablet Take 1 tablet (20 mg total) by mouth every morning. 04/02/21   [name], MD  triamterene-hydrochlorothiazide [facility]) 37.5-25 MG tablet TAKE 1 TABLET BY MOUTH  DAILY 12/21/20   Belva Crome, MD  warfarin (COUMADIN) 5 MG tablet TAKE 1 AND 1/2 TABLETS DAILY AS DIRECTED  BY THE  COUMADIN CLINIC 03/19/21   Belva Crome, MD      Allergies    Prednisone, Tetanus toxoids, Alprazolam, and Septra [sulfamethoxazole-trimethoprim]    Review of Systems   Review of Systems  All other systems reviewed and are negative. Ten systems reviewed and are negative for acute change, except as noted in the HPI.   Physical Exam Updated Vital Signs BP (!) 144/72 (BP Location: Left Arm)   Pulse 74   Temp 98.8 F (37.1 C) (Oral)   Resp 16   Ht $R'5\' 5"'Ub$  (1.651 m)   Wt 76.7 kg   SpO2 100%   BMI 28.12 kg/m  Physical Exam Vitals and nursing note reviewed.  Constitutional:      General: She is not in acute distress.    Appearance: Normal appearance. She is not ill-appearing, toxic-appearing or diaphoretic.  HENT:     Head: Normocephalic.     Comments: 1 cm well approximated linear laceration noted to the right parietal scalp.  Appears to be granulated and well-healing.  No active bleeding.  Minimal tenderness noted in the region.    Right Ear: External ear normal.     Left Ear: External ear normal.     Nose: Nose normal.     Mouth/Throat:     Mouth: Mucous membranes are moist.     Pharynx: Oropharynx is clear.  No oropharyngeal exudate or posterior oropharyngeal erythema.  Eyes:     General: No scleral icterus.       Right eye: No discharge.        Left eye: No discharge.     Extraocular Movements: Extraocular movements intact.     Conjunctiva/sclera: Conjunctivae normal.     Pupils: Pupils are equal, round, and reactive to light.     Comments: PERRL.  EOMI.  Neck:     Comments: No midline C, T, or L-spine tenderness.  No step-offs, crepitus, or deformities. Cardiovascular:     Rate and Rhythm: Normal rate and regular rhythm.     Pulses: Normal pulses.     Heart sounds: Normal heart sounds. No murmur heard.   No friction rub. No gallop.  Pulmonary:     Effort: Pulmonary effort is normal. No respiratory distress.     Breath sounds: Normal breath sounds. No stridor. No  wheezing, rhonchi or rales.     Comments: Lungs are clear to auscultation bilaterally.  No visible signs of trauma noted. Chest:     Chest wall: No tenderness.  Abdominal:     General: Abdomen is flat.     Palpations: Abdomen is soft.     Tenderness: There is no abdominal tenderness.     Comments: Abdomen is soft and nontender.  No visible signs of trauma noted.  Musculoskeletal:        General: Tenderness present. Normal range of motion.     Cervical back: Normal range of motion and neck supple. No tenderness.     Comments: Small abrasion noted to the right elbow.  Mild tenderness in the region.  Full range of motion of the right elbow.  Distal sensation intact.  2+ radial pulse.  Small abrasion noted to the right ankle.  Mild tenderness in the region.  Full range of motion of the right ankle.  Palpable pedal pulses.  Moderate tenderness noted to the right lateral hip.  Full active and passive range of motion of the bilateral hips, knees, and ankles.  No deformities noted.  Distal sensation intact.  Skin:    General: Skin is warm and dry.  Neurological:     General: No focal deficit present.     Mental Status: She is alert and oriented to person, place, and time.  Psychiatric:        Mood and Affect: Mood normal.        Behavior: Behavior normal.   ED Results / Procedures / Treatments   Labs (all labs ordered are listed, but only abnormal results are displayed) Labs Reviewed - No data to display  EKG None  Radiology DG Elbow Complete Right  Result Date: 08/20/2021 CLINICAL DATA:  Fall, right elbow pain EXAM: RIGHT ELBOW - COMPLETE 3+ VIEW COMPARISON:  None Available. FINDINGS: Cortical irregularity along the medial aspect of the proximal ulna seen only on AP view. Mild elevation of the anterior fat pad, equivocal for effusion. Radial head and neck appear intact. No malalignment. Mild soft tissue swelling. IMPRESSION: 1. Cortical irregularity along the medial aspect of the  proximal ulna seen only on AP view, suspicious for nondisplaced fracture. Correlate with point tenderness. 2. Possible small elbow joint effusion. Electronically Signed   By: Davina Poke D.O.   On: 08/20/2021 16:13   DG Ankle Complete Right  Result Date: 08/20/2021 CLINICAL DATA:  [place] this morning.  Right ankle pain. EXAM: RIGHT ANKLE - COMPLETE 3+ VIEW COMPARISON:  None Available. FINDINGS:  The ankle mortise is maintained. No acute ankle fracture. No osteochondral abnormality or ankle joint effusion. The mid and hindfoot bony structures are intact. Small calcaneal heel spur is noted. IMPRESSION: No acute bony findings. Electronically Signed   By: Marijo Sanes M.D.   On: 08/20/2021 16:10   CT HEAD WO CONTRAST (5MM)  Result Date: 08/20/2021 CLINICAL DATA:  Trauma, fall EXAM: CT HEAD WITHOUT CONTRAST TECHNIQUE: Contiguous axial images were obtained from the base of the skull through the vertex without intravenous contrast. RADIATION DOSE REDUCTION: This exam was performed according to the departmental dose-optimization program which includes automated exposure control, adjustment of the mA and/or kV according to patient size and/or use of iterative reconstruction technique. COMPARISON:  None Available. FINDINGS: Brain: No acute intracranial findings are seen in noncontrast CT brain. There are no signs of bleeding. Ventricles are not dilated. Cortical sulci are prominent. There is decreased density in the periventricular white matter. Vascular: There are scattered arterial calcifications. Skull: No fracture is seen in the calvarium. Sinuses/Orbits: Unremarkable. Other: None IMPRESSION: No acute intracranial findings are seen in noncontrast CT brain. Atrophy. Small-vessel disease. Electronically Signed   By: Elmer Picker M.D.   On: 08/20/2021 16:10   CT Cervical Spine Wo Contrast  Result Date: 08/20/2021 CLINICAL DATA:  Trauma, fall EXAM: CT CERVICAL SPINE WITHOUT CONTRAST TECHNIQUE: Multidetector  CT imaging of the cervical spine was performed without intravenous contrast. Multiplanar CT image reconstructions were also generated. RADIATION DOSE REDUCTION: This exam was performed according to the departmental dose-optimization program which includes automated exposure control, adjustment of the mA and/or kV according to patient size and/or use of iterative reconstruction technique. COMPARISON:  None Available. FINDINGS: Alignment: Alignment of posterior margins of vertebral bodies is unremarkable. Skull base and vertebrae: No recent fracture is seen. Degenerative changes are noted at the articulation of odontoid and anterior arch of C1. Degenerative changes with disc space narrowing, bony spurs and facet hypertrophy is seen at multiple levels. Soft tissues and spinal canal: Posterior bony spurs causing extrinsic pressure over the ventral margin of thecal sac, more so at C3-C4 and C5-C6 levels with mild spinal stenosis. Disc levels: There is encroachment of neural foramina from C3-C7 levels. Upper chest: Unremarkable. Other: There is inhomogeneous attenuation in the thyroid. IMPRESSION: No recent fracture is seen. Cervical spondylosis with encroachment of neural foramina from C3-C7 levels. Electronically Signed   By: Elmer Picker M.D.   On: 08/20/2021 16:13   DG Pelvis Portable  Result Date: 08/20/2021 CLINICAL DATA:  Fall on blood thinners. Missed a step at home falling down stairs. EXAM: PORTABLE PELVIS 1-2 VIEWS COMPARISON:  None Available. FINDINGS: The cortical margins of the bony pelvis are intact. No fracture. Pubic symphysis and sacroiliac joints are congruent. Mild bilateral hip osteoarthritis. Both femoral heads are well-seated in the respective acetabula. Multiple surgical clips and enteric sutures in the pelvis. IMPRESSION: No pelvic fracture. Electronically Signed   By: Keith Rake M.D.   On: 08/20/2021 15:48   DG Chest Portable 1 View  Result Date: 08/20/2021 CLINICAL DATA:   86 year old post trauma. Fall on blood thinners. Missed a step at home falling down stairs. EXAM: PORTABLE CHEST 1 VIEW COMPARISON:  Chest radiograph 11/15/2011 FINDINGS: Lung volumes are low. Mild elevation of right hemidiaphragm, chronic but slightly increased from prior. Normal heart size for technique with stable mediastinal contours. Aortic atherosclerosis. No pneumothorax or large pleural effusion. No focal airspace disease. On limited evaluation, no acute rib fracture or acute osseous abnormalities. Multiple  surgical clips in the upper abdomen. IMPRESSION: Low lung volumes without acute abnormality. Electronically Signed   By: Keith Rake M.D.   On: 08/20/2021 15:46   DG Hip Unilat W or Wo Pelvis 2-3 Views Right  Result Date: 08/20/2021 CLINICAL DATA:  Right hip pain.  Fell. EXAM: DG HIP (WITH OR WITHOUT PELVIS) 2-3V RIGHT COMPARISON:  None Available. FINDINGS: The right hip is normally located. No acute fracture is identified. The visualized right hemipelvic bony structures are intact. There appears to be subcutaneous hematoma overlying the right hip. IMPRESSION: No acute bony findings. Electronically Signed   By: Marijo Sanes M.D.   On: 08/20/2021 16:37    Procedures Procedures   Medications Ordered in ED Medications - No data to display  ED Course/ Medical Decision Making/ A&P                           Medical Decision Making Amount and/or Complexity of Data Reviewed Radiology: ordered.   Pt is a 86 y.o. female with history of atrial fibrillation on Coumadin who presents to the emergency department due to a fall that occurred around 2 AM this morning.  Please see HPI above for additional information.  Imaging: X-ray of the right hip and pelvis shows no acute bony findings.  Chest x-ray shows low lung volumes without acute abnormality.  X-ray of the pelvis shows no pelvic fracture.  CT scan of the cervical spine shows no recent fracture seen.  Cervical spondylosis with  encroachment on the neural foramina from C3-C7 levels.  CT scan of the head shows no acute intracranial findings seen in the noncontrast CT brain.  Atrophy.  Small vessel disease.  X-ray of the right ankle shows no acute bony findings.  X-ray of the right elbow shows cortical irregularity along the medial aspect of the proximal ulna seen only on AP view, suspicious for nondisplaced fracture.  Correlate with point tenderness.  Possible small elbow joint effusion.  I, Rayna Sexton, PA-C, personally reviewed and evaluated these images and lab results as part of my medical decision-making.  Physical exam findings as noted above.  Appears generally reassuring.  She does have a small laceration to the right parietal scalp that appears to be healing well.  Does not appear amenable to sutures at this time.  Given the mechanism of injury as well as patient's medical history, obtained CT scan of the head and neck as well as x-rays of the right hip, pelvis, right ankle, right elbow.  Findings as noted above.  Imaging appears generally reassuring.  X-ray of the right elbow does show possible compression fracture along the medial aspect of the proximal ulna suspicious for nondisplaced fracture.  Patient does have mild point tenderness in the region.  Otherwise is full range of motion in the right elbow.  Grip strength intact in the right hand.  Distal sensation intact.  2+ radial pulse.  Patient is left-hand dominant.  We will place her in a sling of the right arm for comfort.  She has an orthopedist and is going to follow-up with his office tomorrow morning.  Patient appears stable for discharge at this time and she is agreeable.  We discussed return precautions.  Her questions were answered and she was amicable at the time of discharge.  Note: Portions of this report may have been transcribed using voice recognition software. Every effort was made to ensure accuracy; however, inadvertent computerized  transcription errors may be  present.   Final Clinical Impression(s) / ED Diagnoses Final diagnoses:  Other closed fracture of proximal end of right ulna, initial encounter  Fall, initial encounter  Laceration of scalp, initial encounter   Rx / DC Orders ED Discharge Orders     None         Rayna Sexton, PA-C 08/20/21 [place]    Kommor, Napoleon, MD 08/21/21 1536

## 2021-09-09 ENCOUNTER — Telehealth: Payer: Self-pay | Admitting: Family Medicine

## 2021-09-09 DIAGNOSIS — F411 Generalized anxiety disorder: Secondary | ICD-10-CM

## 2021-09-09 MED ORDER — LORAZEPAM 0.5 MG PO TABS: 0.5000 mg | ORAL_TABLET | Freq: Two times a day (BID) | ORAL | 1 refills | Status: DC | PRN

## 2021-09-09 NOTE — Telephone Encounter (Signed)
Spoke to patient, she is having lots of anxiety due to her husband had a strokeon 08/23/21 and is waiting on Home health to be set up for him. She is unable to leave him at this time. She would like to know if she could get an Rx for Lorazepam 0.5 mg tabs.   [facility].   Please advise.  Thanks. Dm/cma

## 2021-09-19 ENCOUNTER — Telehealth: Payer: Self-pay | Admitting: *Deleted

## 2021-09-19 NOTE — Telephone Encounter (Signed)
Called pt since she missed today's Anticoagulation appt; there was no answer so left a message for her to call back.

## 2021-09-27 NOTE — Telephone Encounter (Signed)
error 

## 2021-10-07 ENCOUNTER — Other Ambulatory Visit: Payer: Self-pay | Admitting: Family Medicine

## 2021-10-07 DIAGNOSIS — F411 Generalized anxiety disorder: Secondary | ICD-10-CM

## 2021-10-07 MED ORDER — LORAZEPAM 0.5 MG PO TABS: 0.5000 mg | ORAL_TABLET | Freq: Two times a day (BID) | ORAL | 1 refills | Status: DC | PRN

## 2021-10-07 NOTE — Telephone Encounter (Signed)
Spoke to patient and she needs the refill of [facility]epam [facility] mg [facility].  Called [facility] to cancel the refill that was left from last Rx.   Please review and advise. Thanks. Dm/cma

## 2021-10-07 NOTE — Telephone Encounter (Signed)
Caller Name: Judaea Burgoon Call back phone #: [phone]  Reason for Call: Pt said that with change of insurance she has a new pharmacy, the prescription was sent to the original and because of the type of medications Dr. Gena Fray will need to approve transfer and send prescription to new pharm.   [facility] [address], [place], [place] [address]

## 2021-10-08 NOTE — Telephone Encounter (Signed)
Patient notified VIA phone. Dm/cma  

## 2021-10-10 ENCOUNTER — Ambulatory Visit (INDEPENDENT_AMBULATORY_CARE_PROVIDER_SITE_OTHER): Payer: Medicare HMO

## 2021-10-10 ENCOUNTER — Telehealth: Payer: Self-pay | Admitting: Interventional Cardiology

## 2021-10-10 ENCOUNTER — Telehealth: Payer: Self-pay | Admitting: Family Medicine

## 2021-10-10 DIAGNOSIS — I480 Paroxysmal atrial fibrillation: Secondary | ICD-10-CM

## 2021-10-10 DIAGNOSIS — I4891 Unspecified atrial fibrillation: Secondary | ICD-10-CM | POA: Diagnosis not present

## 2021-10-10 DIAGNOSIS — Z5181 Encounter for therapeutic drug level monitoring: Secondary | ICD-10-CM | POA: Diagnosis not present

## 2021-10-10 DIAGNOSIS — F419 Anxiety disorder, unspecified: Secondary | ICD-10-CM

## 2021-10-10 LAB — POCT INR: INR: 2.2 (ref 2.0–3.0)

## 2021-10-10 MED ORDER — PAROXETINE HCL 20 MG PO TABS: 20.0000 mg | ORAL_TABLET | ORAL | 1 refills | Status: DC

## 2021-10-10 MED ORDER — WARFARIN SODIUM 5 MG PO TABS: ORAL_TABLET | ORAL | 1 refills | Status: DC

## 2021-10-10 NOTE — Telephone Encounter (Signed)
Patient hasn't got any from mail order in a while since changing insurances.  [facility].  No further questions. Dm/cma

## 2021-10-10 NOTE — Patient Instructions (Signed)
Continue taking Warfarin 1.5 tablets daily. Recheck INR in 6 weeks. Coumadin Clinic [phone].

## 2021-10-10 NOTE — Telephone Encounter (Signed)
Called the pt to inform her that she needed to contact her PCP for any refills for paroxeline (Paxil), her PCP refills this medication. I advised the pt that if she has any other problems, questions or concerns, to give our office a call. Pt verbalized understanding.

## 2021-10-10 NOTE — Telephone Encounter (Signed)
*  STAT* If patient is at the pharmacy, call can be transferred to refill team.   1. Which medications need to be refilled? (please list name of each medication and dose if known) PARoxetine (PAXIL) 20 MG tablet  2. Which pharmacy/location (including street and city if local pharmacy) is medication to be sent to? [id number] [place] [place] - [address]  3. Do they need a 30 day or 90 day supply? [place]

## 2021-10-10 NOTE — Telephone Encounter (Signed)
Pt needs her [facility]axell [facility] [place]. She took her last pill on today 7/27

## 2021-10-22 ENCOUNTER — Telehealth: Payer: Self-pay | Admitting: Family Medicine

## 2021-10-22 ENCOUNTER — Ambulatory Visit: Payer: Medicare HMO | Admitting: Family Medicine

## 2021-10-22 DIAGNOSIS — H524 Presbyopia: Secondary | ICD-10-CM | POA: Diagnosis not present

## 2021-10-22 NOTE — Telephone Encounter (Signed)
8.8.23 no show letter sent

## 2021-10-24 NOTE — Telephone Encounter (Signed)
1st no show, fee waived, letter sent 

## 2021-11-11 ENCOUNTER — Other Ambulatory Visit: Payer: Self-pay | Admitting: *Deleted

## 2021-11-11 MED ORDER — FLECAINIDE ACETATE 50 MG PO TABS: 50.0000 mg | ORAL_TABLET | Freq: Two times a day (BID) | ORAL | 2 refills | Status: DC

## 2021-11-12 DIAGNOSIS — R69 Illness, unspecified: Secondary | ICD-10-CM | POA: Diagnosis not present

## 2021-11-22 ENCOUNTER — Ambulatory Visit (INDEPENDENT_AMBULATORY_CARE_PROVIDER_SITE_OTHER): Payer: Medicare HMO | Admitting: Family Medicine

## 2021-11-22 ENCOUNTER — Encounter: Payer: Self-pay | Admitting: Family Medicine

## 2021-11-22 VITALS — Temp 97.6°F | Wt 159.6 lb

## 2021-11-22 DIAGNOSIS — F439 Reaction to severe stress, unspecified: Secondary | ICD-10-CM

## 2021-11-22 DIAGNOSIS — F411 Generalized anxiety disorder: Secondary | ICD-10-CM | POA: Diagnosis not present

## 2021-11-22 DIAGNOSIS — R69 Illness, unspecified: Secondary | ICD-10-CM | POA: Diagnosis not present

## 2021-11-22 DIAGNOSIS — E860 Dehydration: Secondary | ICD-10-CM

## 2021-11-22 NOTE — Patient Instructions (Signed)
Call [facility] in 1 week to give an update on how you are doing.

## 2021-11-22 NOTE — Progress Notes (Signed)
[facility] [address] [place] [address] Dept: [phone] Dept Fax: [phone]  Office Visit  Subjective:    Patient ID: Rebecca Sandoval, female    DOB: Dec 26, 1935, 86 y.o..   MRN: [id number]  Chief Complaint  Patient presents with   Dizziness    This morning she experienced vertigo, nausea and feeling hot all over body. She isn't sure if it was an anxiety attack. She states currently she isn't experiencing any symptoms.     History of Present Illness:  Patient is in today for an evaluation of dizziness that she experienced this morning. She notes that she has been under considerable stressors at home. Her husband had a stroke earlier this summer. Although he is making progress with recovery, he still needs assistance. He also has a history of cirrhosis and has to have periodic paracentesis and follow-up for this. She has no family close by to assist with his care. She is also regularly engaged in work around her home and property. She had been spending time outdoors in the past few days and admits she does not hydrate enough. This morning, she was feeling dizzy. She does not characterize this as vertigo. She called EMS who came and did an assessment. She had blood pressures int he 172-188/68-90 range, but no sign of orthostasis. They thought she might be dehydrated. They offered to take her tot he ED, but were comfortable with a plan for her to follow up with me. She still feels mildly week and dizzy "internally".  Past Medical History: Patient Active Problem List   Diagnosis Date Noted   Osteoarthritis of right knee 04/24/2021   Left palmar fibromatosis 07/10/2020   Allergic rhinitis due to pollen 04/11/2020   Osteoarthritis of carpometacarpal [facility]) joint of right thumb 04/11/2020   History of cancer of left breast 04/11/2020   Essential hypertension 04/11/2020   Obesity    Diverticulitis    Hemorrhoid    GERD  (gastroesophageal reflux disease)    Chronic kidney disease, stage 4 (severe) (HCC) 04/28/2017   GAD (generalized anxiety disorder) 04/27/2017   Chronic anticoagulation 05/23/2013   Encounter for therapeutic drug monitoring 05/05/2013   Paroxysmal atrial fibrillation ([place]) 09/23/2012   Tachycardia-bradycardia syndrome ([place]) 09/23/2012   Gout 08/11/2011   Past Surgical History:  Procedure Laterality Date   ABDOMINAL HYSTERECTOMY  03/17/1973   APPENDECTOMY     bartholyn cyst  03/18/1971   BILATERAL TEMPOROMANDIBULAR JOINT ARTHROPLASTY  03/18/1975   BREAST LUMPECTOMY Left 07/28/2014   BREAST LUMPECTOMY WITH RADIOACTIVE SEED LOCALIZATION Left 07/28/2014   Procedure: LEFT BREAST LUMPECTOMY WITH RADIOACTIVE SEED LOCALIZATION;  Surgeon: [name], MD;  Location: [facility];  Service: General;  Laterality: Left;   diverticulitis  03/17/1984   HERNIA REPAIR  03/17/1994   hiatal   SHOULDER SURGERY     left 48, rigth 87   TONSILLECTOMY     Family History  Problem Relation Age of Onset   Stroke Mother 7   Cancer Paternal Aunt    Cancer Paternal Uncle    Outpatient Medications Prior to Visit  Medication Sig Dispense Refill   acetaminophen (TYLENOL) 325 MG tablet Take 650 mg by mouth every 6 (six) hours as needed (pain).     flecainide (TAMBOCOR) 50 MG tablet Take 1 tablet (50 mg total) by mouth 2 (two) times daily. 180 tablet 2   LORazepam (ATIVAN) 0.5 MG tablet Take 1 tablet (0.5 mg total) by mouth 2 (two) times daily  as needed for anxiety. 20 tablet 1   ondansetron (ZOFRAN-ODT) 4 MG disintegrating tablet Take 4 mg by mouth every 6 (six) hours as needed for nausea/vomiting.     PARoxetine (PAXIL) 20 MG tablet Take 1 tablet (20 mg total) by mouth every morning. 90 tablet 1   triamterene-hydrochlorothiazide (MAXZIDE-25) 37.5-25 MG tablet TAKE 1 TABLET BY MOUTH  DAILY 90 tablet 1   warfarin (COUMADIN) 5 MG tablet TAKE 1 AND 1/2 TABLETS DAILY AS DIRECTED  BY THE COUMADIN CLINIC 135 tablet 1    No facility-administered medications prior to visit.   Allergies  Allergen Reactions   Prednisone     "pt stated this puts her in an uncomfortable zone"   Tetanus Toxoids Hives   Alprazolam Anxiety    Increased agitation   Septra [Sulfamethoxazole-Trimethoprim] Rash   Objective:   Today's Vitals   11/22/21 1107 11/22/21 1109 11/22/21 1112  Temp: 97.6 F (36.4 C)    TempSrc: Temporal    SpO2: 96% 98% 97%  Weight: 159 lb 9.6 oz (72.4 kg)     Orthostatics      BP   P Lying:  165/92  89 Sitting:  146/88  88 Standing: 138/84  75  Body mass index is 26.56 kg/m.   General: Well developed, well nourished. No acute distress. HEENT: Normocephalic, non-traumatic. PERRL, EOMI. Conjunctiva clear. External ears normal. EAC and TMs   normal bilaterally. Nose clear without congestion or rhinorrhea. Mucous membranes are mildly dry.  Neuro: No focal neurological deficits. Psych: Alert and oriented. Normal mood and affect.  Health Maintenance Due  Topic Date Due   Zoster Vaccines- Shingrix (1 of 2) Never done   Pneumonia Vaccine 37+ Years old (50 - PPSV23 or PCV20) 03/25/2015     Assessment & Plan:   1. Dehydration I suspect there is an aspect of mild dehydration at play. I recommend she try pushing a sports drink with electrolytes to rehydrate better. She should try to drink fluids regularly, esp. when she is working outdoors more. I asked her to call Langley Gauss this next Friday to let us know how she is doing.   2. Stress at home I suggested she check into some home health services that may assist her in the care of her husband.  3. GAD (generalized anxiety disorder) Stable using intermittent Ativan 0.5 mg daily as needed.  Return if symptoms worsen or fail to improve.   [name], MD

## 2021-11-25 ENCOUNTER — Ambulatory Visit: Payer: Medicare HMO | Attending: Interventional Cardiology

## 2021-11-25 DIAGNOSIS — Z5181 Encounter for therapeutic drug level monitoring: Secondary | ICD-10-CM | POA: Diagnosis not present

## 2021-11-25 DIAGNOSIS — I480 Paroxysmal atrial fibrillation: Secondary | ICD-10-CM

## 2021-11-25 DIAGNOSIS — I4891 Unspecified atrial fibrillation: Secondary | ICD-10-CM | POA: Diagnosis not present

## 2021-11-25 LAB — POCT INR: INR: 1.8 — AB (ref 2.0–3.0)

## 2021-11-25 NOTE — Patient Instructions (Signed)
TAKE 2 TABLETS TODAY AND THEN Continue taking Warfarin 1.5 tablets daily. Recheck INR in 6 weeks. Coumadin Clinic [phone].

## 2021-12-02 ENCOUNTER — Telehealth: Payer: Self-pay | Admitting: Family Medicine

## 2021-12-02 NOTE — Telephone Encounter (Signed)
Pt wanted to report that she is feeling a whole lot better after the med prescribed at last visit.

## 2021-12-13 ENCOUNTER — Other Ambulatory Visit: Payer: Self-pay | Admitting: *Deleted

## 2021-12-13 MED ORDER — TRIAMTERENE-HCTZ 37.5-25 MG PO TABS: 1.0000 | ORAL_TABLET | Freq: Every day | ORAL | 1 refills | Status: DC

## 2022-01-06 ENCOUNTER — Ambulatory Visit: Payer: Medicare HMO | Attending: Interventional Cardiology

## 2022-01-06 DIAGNOSIS — I480 Paroxysmal atrial fibrillation: Secondary | ICD-10-CM

## 2022-01-06 DIAGNOSIS — Z5181 Encounter for therapeutic drug level monitoring: Secondary | ICD-10-CM | POA: Diagnosis not present

## 2022-01-06 DIAGNOSIS — I4891 Unspecified atrial fibrillation: Secondary | ICD-10-CM | POA: Diagnosis not present

## 2022-01-06 LAB — POCT INR: INR: 1.7 — AB (ref 2.0–3.0)

## 2022-01-06 NOTE — Patient Instructions (Addendum)
Description   START taking Warfarin 1.5 tablets daily EXCEPT 2 tablets on Mondays and Wednesdays.  Recheck INR in 3 weeks (per pt request).  Coumadin Clinic [phone].

## 2022-01-15 ENCOUNTER — Ambulatory Visit (INDEPENDENT_AMBULATORY_CARE_PROVIDER_SITE_OTHER): Payer: Medicare HMO | Admitting: Family Medicine

## 2022-01-15 ENCOUNTER — Encounter: Payer: Self-pay | Admitting: Family Medicine

## 2022-01-15 VITALS — BP 144/78 | HR 62 | Temp 97.6°F | Ht 65.0 in | Wt 159.0 lb

## 2022-01-15 DIAGNOSIS — R300 Dysuria: Secondary | ICD-10-CM | POA: Diagnosis not present

## 2022-01-15 LAB — URINALYSIS, ROUTINE W REFLEX MICROSCOPIC
Bilirubin Urine: NEGATIVE
Ketones, ur: NEGATIVE
Nitrite: POSITIVE — AB
Specific Gravity, Urine: 1.015 (ref 1.000–1.030)
Total Protein, Urine: 100 — AB
Urine Glucose: NEGATIVE
Urobilinogen, UA: 1 (ref 0.0–1.0)
pH: 8.5 — AB (ref 5.0–8.0)

## 2022-01-15 LAB — POCT URINALYSIS DIPSTICK
Bilirubin, UA: NEGATIVE
Glucose, UA: NEGATIVE
Ketones, UA: NEGATIVE
Nitrite, UA: POSITIVE
Protein, UA: NEGATIVE
Spec Grav, UA: 1.015 (ref 1.010–1.025)
Urobilinogen, UA: 0.2 E.U./dL
pH, UA: 8 (ref 5.0–8.0)

## 2022-01-15 MED ORDER — CEPHALEXIN 500 MG PO CAPS: 500.0000 mg | ORAL_CAPSULE | Freq: Three times a day (TID) | ORAL | 0 refills | Status: AC

## 2022-01-15 NOTE — Progress Notes (Signed)
Established Patient Office Visit  Subjective   Patient ID: Rebecca Sandoval, female    DOB: 1935-12-14  Age: 86 y.o. MRN: [id number]  Chief Complaint  Patient presents with   Dysuria    Dysuria x 3-4 days some back pains little pressure.     Dysuria  Associated symptoms include frequency and urgency.   3 to 4-day history of dysuria with frequency urgency, lower back and suprapubic discomfort.  There has been no fevers chills nausea or vomiting.  Husband is recovering from a CVA.  He is improving.    Review of Systems  Constitutional: Negative.   HENT: Negative.    Eyes:  Negative for blurred vision, discharge and redness.  Respiratory: Negative.    Cardiovascular: Negative.   Gastrointestinal:  Negative for abdominal pain.  Genitourinary:  Positive for dysuria, frequency and urgency.  Musculoskeletal: Negative.  Negative for myalgias.  Skin:  Negative for rash.  Neurological:  Negative for tingling, loss of consciousness and weakness.  Endo/Heme/Allergies:  Negative for polydipsia.      Objective:     BP (!) 144/78 (BP Location: Right Arm, Patient Position: Sitting, Cuff Size: Large)   Pulse 62   Temp 97.6 F (36.4 C) (Temporal)   Ht $R'5\' 5"'iI$  (1.651 m)   Wt 159 lb (72.1 kg)   SpO2 98%   BMI 26.46 kg/m    Physical Exam Constitutional:      General: She is not in acute distress.    Appearance: Normal appearance. She is not ill-appearing, toxic-appearing or diaphoretic.  HENT:     Head: Normocephalic and atraumatic.     Right Ear: External ear normal.     Left Ear: External ear normal.  Eyes:     General: No scleral icterus.       Right eye: No discharge.        Left eye: No discharge.     Extraocular Movements: Extraocular movements intact.     Conjunctiva/sclera: Conjunctivae normal.  Cardiovascular:     Rate and Rhythm: Normal rate and regular rhythm.  Pulmonary:     Effort: Pulmonary effort is normal. No respiratory distress.     Breath sounds: Normal  breath sounds.  Abdominal:     General: Bowel sounds are normal.     Tenderness: There is no right CVA tenderness or left CVA tenderness.  Musculoskeletal:     Cervical back: No rigidity or tenderness.  Skin:    General: Skin is warm and dry.  Neurological:     Mental Status: She is alert and oriented to person, place, and time.  Psychiatric:        Mood and Affect: Mood normal.        Behavior: Behavior normal.      Results for orders placed or performed in visit on 01/15/22  POCT Urinalysis Dipstick  Result Value Ref Range   Color, UA yellow    Clarity, UA     Glucose, UA Negative Negative   Bilirubin, UA neg    Ketones, UA neg    Spec Grav, UA 1.015 1.010 - 1.025   Blood, UA 2+    pH, UA 8.0 5.0 - 8.0   Protein, UA Negative Negative   Urobilinogen, UA 0.2 0.2 or 1.0 E.U./dL   Nitrite, UA pos    Leukocytes, UA Moderate (2+) (A) Negative   Appearance cloudy    Odor        The ASCVD Risk score (Arnett DK, et al., 2019)  failed to calculate for the following reasons:   The 2019 ASCVD risk score is only valid for ages 2 to 80    Assessment & Plan:   Problem List Items Addressed This Visit   None Visit Diagnoses     Dysuria    -  Primary   Relevant Medications   cephALEXin (KEFLEX) 500 MG capsule   Other Relevant Orders   Urinalysis, Routine w reflex microscopic   Urine Culture   POCT Urinalysis Dipstick (Completed)       Return follow up with Dr. Gena Fray next week.Libby Maw, MD

## 2022-01-18 LAB — URINE CULTURE
MICRO NUMBER:: 14130334
SPECIMEN QUALITY:: ADEQUATE

## 2022-01-23 ENCOUNTER — Telehealth: Payer: Self-pay | Admitting: Family Medicine

## 2022-01-23 NOTE — Telephone Encounter (Signed)
Scheduled her at at 4:00 pm 01/24/22. Dm/cma

## 2022-01-23 NOTE — Telephone Encounter (Signed)
Lft VM to rtn call. Dm/cma  

## 2022-01-23 NOTE — Telephone Encounter (Signed)
Pt called back, please return call  

## 2022-01-23 NOTE — Telephone Encounter (Signed)
Pt said give a call because she medication for kidney infection. Pt stated a culture was sent in also but she did not get a response

## 2022-01-24 ENCOUNTER — Ambulatory Visit (INDEPENDENT_AMBULATORY_CARE_PROVIDER_SITE_OTHER): Payer: Medicare HMO | Admitting: Family Medicine

## 2022-01-24 ENCOUNTER — Encounter: Payer: Self-pay | Admitting: Family Medicine

## 2022-01-24 VITALS — BP 134/76 | HR 88 | Temp 97.9°F | Ht 65.0 in | Wt 156.8 lb

## 2022-01-24 DIAGNOSIS — N3000 Acute cystitis without hematuria: Secondary | ICD-10-CM | POA: Diagnosis not present

## 2022-01-24 DIAGNOSIS — A498 Other bacterial infections of unspecified site: Secondary | ICD-10-CM

## 2022-01-24 LAB — POCT URINALYSIS DIPSTICK
Bilirubin, UA: NEGATIVE
Blood, UA: NEGATIVE
Glucose, UA: NEGATIVE
Ketones, UA: NEGATIVE
Leukocytes, UA: NEGATIVE
Nitrite, UA: NEGATIVE
Protein, UA: NEGATIVE
Spec Grav, UA: 1.015 (ref 1.010–1.025)
Urobilinogen, UA: 0.2 E.U./dL
pH, UA: 6 (ref 5.0–8.0)

## 2022-01-24 NOTE — Progress Notes (Signed)
[facility] [address] [place] [address] Dept: [phone] Dept Fax: [phone]  Office Visit  Subjective:    Patient ID: Rebecca Sandoval, female    DOB: [birth year], 86 y.o..   MRN: [id number]  Chief Complaint  Patient presents with   Follow-up    F/u urine infection.     History of Present Illness:  Patient is in today for follow-up from a recent UTI. She was seen on 11/1 by Dr. Ethelene Hal with urinary frequency, urgency, lower back and suprapubic discomfort. He treated her with a course of cephalexin. She notes her symptoms are now resolved. Rebecca Sandoval notes she had never had a UTI previously. She is not having any current signs of a yeast infection.  Rebecca Sandoval husband is in poor health. We discussed the struggles she has with helping him and her approaches to her own well-being.  Past Medical History: Patient Active Problem List   Diagnosis Date Noted   Osteoarthritis of right knee 04/24/2021   Left palmar fibromatosis 07/10/2020   Allergic rhinitis due to pollen 04/11/2020   Osteoarthritis of carpometacarpal [facility]) joint of right thumb 04/11/2020   History of cancer of left breast 04/11/2020   Essential hypertension 04/11/2020   Obesity    Diverticulitis    Hemorrhoid    GERD (gastroesophageal reflux disease)    Chronic kidney disease, stage 4 (severe) (HCC) 04/28/2017   GAD (generalized anxiety disorder) 04/27/2017   Chronic anticoagulation 05/23/2013   Encounter for therapeutic drug monitoring 05/05/2013   Paroxysmal atrial fibrillation ([place]) 09/23/2012   Tachycardia-bradycardia syndrome ([place]) 09/23/2012   Gout 08/11/2011   Past Surgical History:  Procedure Laterality Date   ABDOMINAL HYSTERECTOMY  03/17/1973   APPENDECTOMY     bartholyn cyst  03/18/1971   BILATERAL TEMPOROMANDIBULAR JOINT ARTHROPLASTY  03/18/1975   BREAST LUMPECTOMY Left 07/28/2014   BREAST LUMPECTOMY WITH RADIOACTIVE SEED  LOCALIZATION Left 07/28/2014   Procedure: LEFT BREAST LUMPECTOMY WITH RADIOACTIVE SEED LOCALIZATION;  Surgeon: [name], MD;  Location: [facility];  Service: General;  Laterality: Left;   diverticulitis  03/17/1984   HERNIA REPAIR  03/17/1994   hiatal   SHOULDER SURGERY     left 82, rigth 79   TONSILLECTOMY     Family History  Problem Relation Age of Onset   Stroke Mother 36   Cancer Paternal Aunt    Cancer Paternal Uncle    Outpatient Medications Prior to Visit  Medication Sig Dispense Refill   acetaminophen (TYLENOL) 325 MG tablet Take 650 mg by mouth every 6 (six) hours as needed (pain).     flecainide (TAMBOCOR) 50 MG tablet Take 1 tablet (50 mg total) by mouth 2 (two) times daily. 180 tablet 2   LORazepam (ATIVAN) 0.5 MG tablet Take 1 tablet (0.5 mg total) by mouth 2 (two) times daily as needed for anxiety. 20 tablet 1   ondansetron (ZOFRAN-ODT) 4 MG disintegrating tablet Take 4 mg by mouth every 6 (six) hours as needed for nausea/vomiting.     PARoxetine (PAXIL) 20 MG tablet Take 1 tablet (20 mg total) by mouth every morning. 90 tablet 1   triamterene-hydrochlorothiazide (MAXZIDE-25) 37.5-25 MG tablet Take 1 tablet by mouth daily. 90 tablet 1   warfarin (COUMADIN) 5 MG tablet TAKE 1 AND 1/2 TABLETS DAILY AS DIRECTED  BY THE COUMADIN CLINIC 135 tablet 1   No facility-administered medications prior to visit.   Allergies  Allergen Reactions   Prednisone     "pt  stated this puts her in an uncomfortable zone"   Tetanus Toxoids Hives   Alprazolam Anxiety    Increased agitation   Septra [Sulfamethoxazole-Trimethoprim] Rash    Objective:   Today's Vitals   01/24/22 1554  BP: 134/76  Pulse: 88  Temp: 97.9 F (36.6 C)  TempSrc: Temporal  SpO2: 94%  Weight: 156 lb 12.8 oz (71.1 kg)  Height: $Remove'5\' 5"'RIBjwHz$  (1.651 m)   Body mass index is 26.09 kg/m.   General: Well developed, well nourished. No acute distress. Psych: Alert and oriented. Normal mood and affect.  Health  Maintenance Due  Topic Date Due   Zoster Vaccines- Shingrix (1 of 2) Never done   Pneumonia Vaccine 22+ Years old (3 - PPSV23 or PCV20) 03/25/2015   Lab Results Component Ref Range & Units 16:06 (01/24/22) 9 d ago (01/15/22)  Color, UA  yellow yellow  Clarity, UA  clear   Glucose, UA Negative Negative Negative  Bilirubin, UA  neg neg  Ketones, UA  neg neg  Spec Grav, UA 1.010 - 1.025 1.015 1.015  Blood, UA  neg 2+ CM  pH, UA 5.0 - 8.0 6.0 8.0  Protein, UA Negative Negative Negative  Urobilinogen, UA 0.2 or 1.0 E.U./dL 0.2 0.2  Nitrite, UA  neg pos  Leukocytes, UA Negative Negative Moderate (2+) Abnormal  CM  Appearance   cloudy   Component 9 d ago  MICRO NUMBER: [id number]  SPECIMEN QUALITY: Adequate  Sample Source URINE  STATUS: FINAL  ISOLATE 1: Proteus mirabilis Abnormal   Comment: Greater than 100,000 CFU/mL of Proteus mirabilis This organism may show imipenem resistance by mechanisms other than a carbapenemase.  Resulting Agency [facility]     Susceptibility   Proteus mirabilis   URINE CULTURE, REFLEX   AMOX/CLAVULANIC <=2 Sensitive   AMPICILLIN <=2 Sensitive   AMPICILLIN/SULBACTAM <=2 Sensitive   CEFAZOLIN <=4 Not Reportable 1   CEFEPIME <=1 Sensitive   CEFTAZIDIME 2 Sensitive   CEFTRIAXONE 2 Intermediate   CIPROFLOXACIN <=0.25 Sensitive   GENTAMICIN <=1 Sensitive   IMIPENEM 2 Intermediate   LEVOFLOXACIN <=0.12 Sensitive   NITROFURANTOIN 64 Intermediate   PIP/TAZO <=4 Sensitive   TOBRAMYCIN <=1 Sensitive   TRIMETH/SULFA <=20 Sensitive 2         Assessment & Plan:   1. Acute cystitis without hematuria 2. Bacterial infection due to Proteus mirabilis Urine today appears clear. No further intervention needed.  - POCT Urinalysis Dipstick   Return if symptoms worsen or fail to improve.   Rebecca Salter, MD

## 2022-01-27 ENCOUNTER — Ambulatory Visit: Payer: Medicare HMO

## 2022-02-05 ENCOUNTER — Ambulatory Visit: Payer: Medicare HMO | Attending: Cardiology

## 2022-02-11 ENCOUNTER — Other Ambulatory Visit: Payer: Self-pay | Admitting: Family Medicine

## 2022-02-11 DIAGNOSIS — F419 Anxiety disorder, unspecified: Secondary | ICD-10-CM

## 2022-02-14 ENCOUNTER — Ambulatory Visit: Payer: Medicare HMO | Attending: Cardiovascular Disease

## 2022-02-14 DIAGNOSIS — I4891 Unspecified atrial fibrillation: Secondary | ICD-10-CM

## 2022-02-14 DIAGNOSIS — Z5181 Encounter for therapeutic drug level monitoring: Secondary | ICD-10-CM | POA: Diagnosis not present

## 2022-02-14 DIAGNOSIS — I480 Paroxysmal atrial fibrillation: Secondary | ICD-10-CM

## 2022-02-14 LAB — POCT INR: INR: 3.8 — AB (ref 2.0–3.0)

## 2022-02-14 NOTE — Patient Instructions (Signed)
HOLD TODAY ONLY THEN CONTINUE taking Warfarin 1.5 tablets daily EXCEPT 2 tablets on Mondays and Wednesdays.  Recheck INR in 3 weeks (per pt request).  [facility] [phone].

## 2022-02-26 ENCOUNTER — Telehealth: Payer: Self-pay | Admitting: Interventional Cardiology

## 2022-02-26 NOTE — Telephone Encounter (Signed)
Returned call to patient.   Discussed with her Dr. Thompson Caul recommendation for new provider after he retires: Rebecca Furbish, MD or Rebecca Haskell, MD or Rebecca Kaufman, MD.  Patient did not have a preference. Scheduled appt with Dr. Johney Sandoval on 06/04/2022 at 4:00PM.  Patient verbalized understanding and expressed appreciation for assistance.

## 2022-02-26 NOTE — Telephone Encounter (Signed)
Patient calling to get Dr. Tamala Julian recommendation on what dr she should start seeing. Please advise

## 2022-03-07 ENCOUNTER — Ambulatory Visit: Payer: Medicare HMO

## 2022-03-21 ENCOUNTER — Ambulatory Visit: Payer: Medicare HMO | Attending: Cardiology

## 2022-03-21 DIAGNOSIS — I4891 Unspecified atrial fibrillation: Secondary | ICD-10-CM | POA: Diagnosis not present

## 2022-03-21 DIAGNOSIS — I480 Paroxysmal atrial fibrillation: Secondary | ICD-10-CM | POA: Diagnosis not present

## 2022-03-21 DIAGNOSIS — Z5181 Encounter for therapeutic drug level monitoring: Secondary | ICD-10-CM

## 2022-03-21 LAB — POCT INR: INR: 3 (ref 2.0–3.0)

## 2022-03-21 NOTE — Patient Instructions (Signed)
CONTINUE taking Warfarin 1.5 tablets daily EXCEPT 2 tablets on Mondays and Wednesdays.  Recheck INR in 6 weeks  [facility] [phone].

## 2022-05-02 ENCOUNTER — Ambulatory Visit: Payer: Medicare HMO | Attending: Interventional Cardiology | Admitting: Pharmacist

## 2022-05-02 DIAGNOSIS — I480 Paroxysmal atrial fibrillation: Secondary | ICD-10-CM | POA: Diagnosis not present

## 2022-05-02 DIAGNOSIS — Z5181 Encounter for therapeutic drug level monitoring: Secondary | ICD-10-CM

## 2022-05-02 DIAGNOSIS — I4891 Unspecified atrial fibrillation: Secondary | ICD-10-CM | POA: Diagnosis not present

## 2022-05-02 LAB — POCT INR: INR: 2.2 (ref 2.0–3.0)

## 2022-05-02 NOTE — Patient Instructions (Signed)
Description   CONTINUE taking Warfarin 1.5 tablets daily EXCEPT 2 tablets on Mondays and Wednesdays.  Recheck INR in 6 weeks  [facility] [phone].

## 2022-05-09 ENCOUNTER — Ambulatory Visit (INDEPENDENT_AMBULATORY_CARE_PROVIDER_SITE_OTHER): Payer: Medicare HMO

## 2022-05-09 VITALS — Ht 65.0 in | Wt 148.0 lb

## 2022-05-09 DIAGNOSIS — Z0000 Encounter for general adult medical examination without abnormal findings: Secondary | ICD-10-CM

## 2022-05-09 NOTE — Patient Instructions (Signed)
Ms. Blassingame , Thank you for taking time to come for your Medicare Wellness Visit. I appreciate your ongoing commitment to your health goals. Please review the following plan we discussed and let me know if I can assist you in the future.   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, be recognized for making stained glass        This is a list of the screening recommended for you and due dates:  Health Maintenance  Topic Date Due   DTaP/Tdap/Td vaccine (1 - Tdap) Never done   Zoster (Shingles) Vaccine (1 of 2) Never done   Pneumonia Vaccine (3 of 3 - PPSV23 or PCV20) 03/25/2019   COVID-19 Vaccine (4 - 2023-24 season) 11/15/2021   Medicare Annual Wellness Visit  05/10/2023   Flu Shot  Completed   DEXA scan (bone density measurement)  Completed   HPV Vaccine  Aged Out    Advanced directives: Please bring a copy of your POA (Power of Lena) and/or Living Will to your next appointment.   Conditions/risks identified: none  Next appointment: Follow up in one year for your annual wellness visit    Preventive Care 65 Years and Older, Female Preventive care refers to lifestyle choices and visits with your health care provider that can promote health and wellness. What does preventive care include? A yearly physical exam. This is also called an annual well check. Dental exams once or twice a year. Routine eye exams. Ask your health care provider how often you should have your eyes checked. Personal lifestyle choices, including: Daily care of your teeth and gums. Regular physical activity. Eating a healthy diet. Avoiding tobacco and drug use. Limiting alcohol use. Practicing safe sex. Taking low-dose aspirin every day. Taking vitamin and mineral supplements as recommended by your health care provider. What happens during an annual well check? The services and screenings done by your health care provider during your annual well check will depend  on your age, overall health, lifestyle risk factors, and family history of disease. Counseling  Your health care provider may ask you questions about your: Alcohol use. Tobacco use. Drug use. Emotional well-being. Home and relationship well-being. Sexual activity. Eating habits. History of falls. Memory and ability to understand (cognition). Work and work Statistician. Reproductive health. Screening  You may have the following tests or measurements: Height, weight, and BMI. Blood pressure. Lipid and cholesterol levels. These may be checked every 5 years, or more frequently if you are over 75 years old. Skin check. Lung cancer screening. You may have this screening every year starting at age 55 if you have a 30-pack-year history of smoking and currently smoke or have quit within the past 15 years. Fecal occult blood test (FOBT) of the stool. You may have this test every year starting at age 34. Flexible sigmoidoscopy or colonoscopy. You may have a sigmoidoscopy every 5 years or a colonoscopy every 10 years starting at age 60. Hepatitis C blood test. Hepatitis B blood test. Sexually transmitted disease (STD) testing. Diabetes screening. This is done by checking your blood sugar (glucose) after you have not eaten for a while (fasting). You may have this done every 1-3 years. Bone density scan. This is done to screen for osteoporosis. You may have this done starting at age 45. Mammogram. This may be done every 1-2 years. Talk to your health care provider about how often you should have regular mammograms. Talk  with your health care provider about your test results, treatment options, and if necessary, the need for more tests. Vaccines  Your health care provider may recommend certain vaccines, such as: Influenza vaccine. This is recommended every year. Tetanus, diphtheria, and acellular pertussis (Tdap, Td) vaccine. You may need a Td booster every 10 years. Zoster vaccine. You may need  this after age 30. Pneumococcal 13-valent conjugate (PCV13) vaccine. One dose is recommended after age 86. Pneumococcal polysaccharide (PPSV23) vaccine. One dose is recommended after age 19. Talk to your health care provider about which screenings and vaccines you need and how often you need them. This information is not intended to replace advice given to you by your health care provider. Make sure you discuss any questions you have with your health care provider. Document Released: 03/30/2015 Document Revised: 11/21/2015 Document Reviewed: 01/02/2015 Elsevier Interactive Patient Education  2017 [place] Prevention in the Home Falls can cause injuries. They can happen to people of all ages. There are many things you can do to make your home safe and to help prevent falls. What can I do on the outside of my home? Regularly fix the edges of walkways and driveways and fix any cracks. Remove anything that might make you trip as you walk through a door, such as a raised step or threshold. Trim any bushes or trees on the path to your home. Use bright outdoor lighting. Clear any walking paths of anything that might make someone trip, such as rocks or tools. Regularly check to see if handrails are loose or broken. Make sure that both sides of any steps have handrails. Any raised decks and porches should have guardrails on the edges. Have any leaves, snow, or ice cleared regularly. Use sand or salt on walking paths during winter. Clean up any spills in your garage right away. This includes oil or grease spills. What can I do in the bathroom? Use night lights. Install grab bars by the toilet and in the tub and shower. Do not use towel bars as grab bars. Use non-skid mats or decals in the tub or shower. If you need to sit down in the shower, use a plastic, non-slip stool. Keep the floor dry. Clean up any water that spills on the floor as soon as it happens. Remove soap buildup in the tub  or shower regularly. Attach bath mats securely with double-sided non-slip rug tape. Do not have throw rugs and other things on the floor that can make you trip. What can I do in the bedroom? Use night lights. Make sure that you have a light by your bed that is easy to reach. Do not use any sheets or blankets that are too big for your bed. They should not hang down onto the floor. Have a firm chair that has side arms. You can use this for support while you get dressed. Do not have throw rugs and other things on the floor that can make you trip. What can I do in the kitchen? Clean up any spills right away. Avoid walking on wet floors. Keep items that you use a lot in easy-to-reach places. If you need to reach something above you, use a strong step stool that has a grab bar. Keep electrical cords out of the way. Do not use floor polish or wax that makes floors slippery. If you must use wax, use non-skid floor wax. Do not have throw rugs and other things on the floor that can make you  trip. What can I do with my stairs? Do not leave any items on the stairs. Make sure that there are handrails on both sides of the stairs and use them. Fix handrails that are broken or loose. Make sure that handrails are as long as the stairways. Check any carpeting to make sure that it is firmly attached to the stairs. Fix any carpet that is loose or worn. Avoid having throw rugs at the top or bottom of the stairs. If you do have throw rugs, attach them to the floor with carpet tape. Make sure that you have a light switch at the top of the stairs and the bottom of the stairs. If you do not have them, ask someone to add them for you. What else can I do to help prevent falls? Wear shoes that: Do not have high heels. Have rubber bottoms. Are comfortable and fit you well. Are closed at the toe. Do not wear sandals. If you use a stepladder: Make sure that it is fully opened. Do not climb a closed stepladder. Make  sure that both sides of the stepladder are locked into place. Ask someone to hold it for you, if possible. Clearly mark and make sure that you can see: Any grab bars or handrails. First and last steps. Where the edge of each step is. Use tools that help you move around (mobility aids) if they are needed. These include: Canes. Walkers. Scooters. Crutches. Turn on the lights when you go into a dark area. Replace any light bulbs as soon as they burn out. Set up your furniture so you have a clear path. Avoid moving your furniture around. If any of your floors are uneven, fix them. If there are any pets around you, be aware of where they are. Review your medicines with your doctor. Some medicines can make you feel dizzy. This can increase your chance of falling. Ask your doctor what other things that you can do to help prevent falls. This information is not intended to replace advice given to you by your health care provider. Make sure you discuss any questions you have with your health care provider. Document Released: 12/28/2008 Document Revised: 08/09/2015 Document Reviewed: 04/07/2014 Elsevier Interactive Patient Education  2017 [employer].

## 2022-05-09 NOTE — Progress Notes (Signed)
I connected with  Gorst on 05/09/22 by a audio enabled telemedicine application and verified that I am speaking with the correct person using two identifiers.  Patient Location: Home  Provider Location: Office/Clinic  I discussed the limitations of evaluation and management by telemedicine. The patient expressed understanding and agreed to proceed.  Subjective:   Rebecca Sandoval is a 87 y.o. female who presents for Medicare Annual (Subsequent) preventive examination.  Review of Systems     Cardiac Risk Factors include: advanced age (>71men, >4 women);hypertension     Objective:    Today's Vitals   05/09/22 1439  Weight: 148 lb (67.1 kg)  Height: $Remove'5\' 5"'GNuZqOk$  (1.651 m)   Body mass index is 24.63 kg/m.     05/09/2022    2:45 PM 08/20/2021    3:42 PM 05/07/2021    3:09 PM 03/01/2021    1:37 PM 07/24/2014    2:13 PM  Advanced Directives  Does Patient Have a Medical Advance Directive? Yes Yes Yes Yes No  Type of Advance Directive Out of facility DNR (pink MOST or yellow form) Out of facility DNR (pink MOST or yellow form) [place];Living will [place];Out of facility DNR (pink MOST or yellow form);Living will   Copy of [place] in Chart?   No - copy requested    Would patient like information on creating a medical advance directive?     No - patient declined information    Current Medications (verified) Outpatient Encounter Medications as of 05/09/2022  Medication Sig   acetaminophen (TYLENOL) 325 MG tablet Take 650 mg by mouth every 6 (six) hours as needed (pain).   flecainide (TAMBOCOR) 50 MG tablet Take 1 tablet (50 mg total) by mouth 2 (two) times daily.   LORazepam (ATIVAN) 0.5 MG tablet Take 1 tablet (0.5 mg total) by mouth 2 (two) times daily as needed for anxiety.   ondansetron (ZOFRAN-ODT) 4 MG disintegrating tablet Take 4 mg by mouth every 6 (six) hours as needed for nausea/vomiting.   PARoxetine (PAXIL)  20 MG tablet TAKE 1 TABLET BY MOUTH EVERY DAY IN THE MORNING   triamterene-hydrochlorothiazide (MAXZIDE-25) 37.5-25 MG tablet Take 1 tablet by mouth daily.   warfarin (COUMADIN) 5 MG tablet TAKE 1 AND 1/2 TABLETS DAILY AS DIRECTED  BY THE COUMADIN CLINIC   No facility-administered encounter medications on file as of 05/09/2022.    Allergies (verified) Prednisone, Tetanus toxoids, Alprazolam, and Septra [sulfamethoxazole-trimethoprim]   History: Past Medical History:  Diagnosis Date   Anxiety    Complication of anesthesia    Difficult to wake up.   Diverticulitis    GERD (gastroesophageal reflux disease)    Hemorrhoid    Obesity    Paroxysmal atrial fibrillation (HCC)    Tachycardia-bradycardia [facility])    Past Surgical History:  Procedure Laterality Date   ABDOMINAL HYSTERECTOMY  03/17/1973   APPENDECTOMY     bartholyn cyst  03/18/1971   BILATERAL TEMPOROMANDIBULAR JOINT ARTHROPLASTY  03/18/1975   BREAST LUMPECTOMY Left 07/28/2014   BREAST LUMPECTOMY WITH RADIOACTIVE SEED LOCALIZATION Left 07/28/2014   Procedure: LEFT BREAST LUMPECTOMY WITH RADIOACTIVE SEED LOCALIZATION;  Surgeon: [name], MD;  Location: [facility] OR;  Service: General;  Laterality: Left;   diverticulitis  03/17/1984   HERNIA REPAIR  03/17/1994   hiatal   KNEE SURGERY Right    12/2021   SHOULDER SURGERY     left 97, rigth 98   TONSILLECTOMY     Family  History  Problem Relation Age of Onset   Stroke Mother 35   Cancer Paternal 5    Cancer Paternal Uncle    Social History   Socioeconomic History   Marital status: Married    Spouse name: Mariea Clonts   Number of children: 2   Years of education: Not on file   Highest education level: GED or equivalent  Occupational History    Comment: Retired  Tobacco Use   Smoking status: Former   Smokeless tobacco: Never  Scientific laboratory technician Use: Never used  Substance and Sexual Activity   Alcohol use: No   Drug use: No   Sexual activity: Yes  Other Topics  Concern   Not on file  Social History Narrative   Pt lives in [place] with spouse      Left handed    Lives in a one story home   Social Determinants of Health   Financial Resource Strain: Low Risk  (05/09/2022)   Overall Financial Resource Strain (CARDIA)    Difficulty of Paying Living Expenses: Not hard at all  Food Insecurity: No Food Insecurity (05/09/2022)   Hunger Vital Sign    Worried About Running Out of Food in the Last Year: Never true    Ran Out of Food in the Last Year: Never true  Transportation Needs: No Transportation Needs (05/09/2022)   PRAPARE - Hydrologist (Medical): No    Lack of Transportation (Non-Medical): No  Physical Activity: Insufficiently Active (05/09/2022)   Exercise Vital Sign    Days of Exercise per Week: 4 days    Minutes of Exercise per Session: 30 min  Stress: No Stress Concern Present (05/09/2022)   [place]    Feeling of Stress : Not at all  Social Connections: Moderately Isolated (05/07/2021)   Social Connection and Isolation Panel [NHANES]    Frequency of Communication with Friends and Family: Twice a week    Frequency of Social Gatherings with Friends and Family: Twice a week    Attends Religious Services: Never    Marine scientist or Organizations: No    Attends Music therapist: Never    Marital Status: Married    Tobacco Counseling Counseling given: Not Answered   Clinical Intake:  Pre-visit preparation completed: Yes  Pain : No/denies pain     Nutritional Status: BMI of 19-24  Normal Nutritional Risks: None Diabetes: No  How often do you need to have someone help you when you read instructions, pamphlets, or other written materials from your doctor or pharmacy?: 1 - Never  Diabetic?no  Interpreter Needed?: No  Information entered by :: NAllen LPN   Activities of Daily Living    05/09/2022    2:48  PM  In your present state of health, do you have any difficulty performing the following activities:  Hearing? 0  Vision? 0  Difficulty concentrating or making decisions? 0  Walking or climbing stairs? 0  Dressing or bathing? 0  Doing errands, shopping? 0  Preparing Food and eating ? N  Using the Toilet? N  In the past six months, have you accidently leaked urine? N  Do you have problems with loss of bowel control? N  Managing your Medications? N  Managing your Finances? N  Housekeeping or managing your Housekeeping? N    Patient Care Team: [name], MD as PCP - General (Family Medicine) Belva Crome, MD (  Inactive) as Consulting Physician (Cardiology) Germaine Pomfret, [facility] as Pharmacist (Pharmacist) Alda Berthold, DO as Consulting Physician (Neurology) Earlie Server, MD as Consulting Physician (Orthopedic Surgery)  Indicate any recent Medical Services you may have received from other than [facility] providers in the past year (date may be approximate).     Assessment:   This is a routine wellness examination for Soraya.  Hearing/Vision screen Vision Screening - Comments:: Regular eye exams,   Dietary issues and exercise activities discussed: Current Exercise Habits: Home exercise routine, Type of exercise: walking, Time (Minutes): 20, Frequency (Times/Week): 4, Weekly Exercise (Minutes/Week): 80   Goals Addressed             This Visit's Progress    Patient Stated       05/09/2022, be recognized for making stained glass       Depression Screen    05/09/2022    2:47 PM 01/15/2022   10:11 AM 05/07/2021    3:13 PM 05/07/2021    3:08 PM 07/10/2020    1:46 PM 04/11/2020    1:54 PM  PHQ 2/9 Scores  PHQ - 2 Score 0 0 0 0 0 1    Fall Risk    05/09/2022    2:46 PM 01/15/2022   10:11 AM 05/07/2021    3:10 PM 03/01/2021    1:37 PM 10/23/2020    3:00 PM  Fall Risk   Falls in the past year? 1 0 0 0 0  Comment slipped down the steps      Number falls in past  yr: 0 0 0 0 0  Injury with Fall? 0  0 0 0  Risk for fall due to : Medication side effect  No Fall Risks  No Fall Risks  Follow up Falls prevention discussed;Education provided;Falls evaluation completed  Falls evaluation completed      FALL RISK PREVENTION PERTAINING TO THE HOME:  Any stairs in or around the home? Yes  If so, are there any without handrails? No  Home free of loose throw rugs in walkways, pet beds, electrical cords, etc? Yes  Adequate lighting in your home to reduce risk of falls? Yes   ASSISTIVE DEVICES UTILIZED TO PREVENT FALLS:  Life alert? No  Use of a cane, walker or w/c? No  Grab bars in the bathroom? No  Shower chair or bench in shower? Yes  Elevated toilet seat or a handicapped toilet? No   TIMED UP AND GO:  Was the test performed? No .      Cognitive Function:        05/09/2022    2:50 PM  6CIT Screen  What Year? 0 points  What month? 0 points  What time? 0 points  Count back from 20 2 points  Months in reverse 0 points  Repeat phrase 2 points  Total Score 4 points    Immunizations Immunization History  Administered Date(s) Administered   Fluad Quad(high Dose 65+) 11/27/2018   Hepatitis B 10/19/2017   Hepatitis B, ADULT 11/23/2017   Influenza Split 02/17/2011, 11/28/2013   Influenza, High Dose Seasonal PF 01/13/2015, 12/20/2016, 11/23/2017   Influenza,inj,quad, With Preservative 01/19/2016   Influenza,trivalent, recombinat, inj, PF 12/02/2011, 12/22/2012   Influenza-Unspecified 12/25/2019, 02/15/2021, 11/07/2021   PFIZER(Purple Top)SARS-COV-2 Vaccination 05/08/2019, 05/31/2019, 12/16/2019   Pneumococcal Conjugate-13 03/24/2014   Pneumococcal Polysaccharide-23 01/15/2002   RSV IGIV 11/07/2021    TDAP status: Due, Education has been provided regarding the importance of this vaccine. Advised may receive this vaccine  at local pharmacy or Health Dept. Aware to provide a copy of the vaccination record if obtained from local pharmacy or  Health Dept. Verbalized acceptance and understanding.  Flu Vaccine status: Up to date  Pneumococcal vaccine status: Up to date  Covid-19 vaccine status: Completed vaccines  Qualifies for Shingles Vaccine? Yes   Zostavax completed No   Shingrix Completed?: No.    Education has been provided regarding the importance of this vaccine. Patient has been advised to call insurance company to determine out of pocket expense if they have not yet received this vaccine. Advised may also receive vaccine at local pharmacy or Health Dept. Verbalized acceptance and understanding.  Screening Tests Health Maintenance  Topic Date Due   DTaP/Tdap/Td (1 - Tdap) Never done   Zoster Vaccines- Shingrix (1 of 2) Never done   Pneumonia Vaccine 16+ Years old (3 of 3 - PPSV23 or PCV20) 03/25/2019   COVID-19 Vaccine (4 - 2023-24 season) 11/15/2021   Medicare Annual Wellness (AWV)  05/07/2022   INFLUENZA VACCINE  Completed   DEXA SCAN  Completed   HPV VACCINES  Aged Out    Health Maintenance  Health Maintenance Due  Topic Date Due   DTaP/Tdap/Td (1 - Tdap) Never done   Zoster Vaccines- Shingrix (1 of 2) Never done   Pneumonia Vaccine 71+ Years old (3 of 3 - PPSV23 or PCV20) 03/25/2019   COVID-19 Vaccine (4 - 2023-24 season) 11/15/2021   Medicare Annual Wellness (AWV)  05/07/2022    Colorectal cancer screening: No longer required.   Mammogram status: No longer required due to age.  Bone Density status: Completed 10/19/2008.   Lung Cancer Screening: (Low Dose CT Chest recommended if Age 20-80 years, 30 pack-year currently smoking OR have quit w/in 15years.) does not qualify.   Lung Cancer Screening Referral: no  Additional Screening:  Hepatitis C Screening: does not qualify;   Vision Screening: Recommended annual ophthalmology exams for early detection of glaucoma and other disorders of the eye. Is the patient up to date with their annual eye exam?  Yes  Who is the provider or what is the name of  the office in which the patient attends annual eye exams? Can't remember name If pt is not established with a provider, would they like to be referred to a provider to establish care? No .   Dental Screening: Recommended annual dental exams for proper oral hygiene  Community Resource Referral / Chronic Care Management: CRR required this visit?  No   CCM required this visit?  No      Plan:     I have personally reviewed and noted the following in the patient's chart:   Medical and social history Use of alcohol, tobacco or illicit drugs  Current medications and supplements including opioid prescriptions. Patient is not currently taking opioid prescriptions. Functional ability and status Nutritional status Physical activity Advanced directives List of other physicians Hospitalizations, surgeries, and ER visits in previous 12 months Vitals Screenings to include cognitive, depression, and falls Referrals and appointments  In addition, I have reviewed and discussed with patient certain preventive protocols, quality metrics, and best practice recommendations. A written personalized care plan for preventive services as well as general preventive health recommendations were provided to patient.     Barb Merino, LPN   1/42/7391   Nurse Notes: none  Due to this being a virtual visit, the after visit summary with patients personalized plan was offered to patient via mail or my-chart. to pick  up at office at next visit

## 2022-05-12 ENCOUNTER — Ambulatory Visit: Payer: Medicare HMO | Admitting: Neurology

## 2022-05-12 ENCOUNTER — Other Ambulatory Visit: Payer: Self-pay | Admitting: *Deleted

## 2022-05-12 ENCOUNTER — Encounter: Payer: Self-pay | Admitting: Neurology

## 2022-05-12 DIAGNOSIS — Z029 Encounter for administrative examinations, unspecified: Secondary | ICD-10-CM

## 2022-05-12 MED ORDER — WARFARIN SODIUM 5 MG PO TABS: ORAL_TABLET | ORAL | 0 refills | Status: DC

## 2022-05-12 NOTE — Telephone Encounter (Signed)
Prescription refill request received for warfarin Lov: Rebecca Sandoval, 06/07/2021 Next INR check:  3/29 Warfarin tablet strength: $RemoveBeforeDE'5mg'[id number]$ 

## 2022-05-14 DIAGNOSIS — K08409 Partial loss of teeth, unspecified cause, unspecified class: Secondary | ICD-10-CM | POA: Diagnosis not present

## 2022-05-14 DIAGNOSIS — I4891 Unspecified atrial fibrillation: Secondary | ICD-10-CM | POA: Diagnosis not present

## 2022-05-14 DIAGNOSIS — Z87891 Personal history of nicotine dependence: Secondary | ICD-10-CM | POA: Diagnosis not present

## 2022-05-14 DIAGNOSIS — N189 Chronic kidney disease, unspecified: Secondary | ICD-10-CM | POA: Diagnosis not present

## 2022-05-14 DIAGNOSIS — I129 Hypertensive chronic kidney disease with stage 1 through stage 4 chronic kidney disease, or unspecified chronic kidney disease: Secondary | ICD-10-CM | POA: Diagnosis not present

## 2022-05-14 DIAGNOSIS — Z7901 Long term (current) use of anticoagulants: Secondary | ICD-10-CM | POA: Diagnosis not present

## 2022-05-14 DIAGNOSIS — K219 Gastro-esophageal reflux disease without esophagitis: Secondary | ICD-10-CM | POA: Diagnosis not present

## 2022-05-14 DIAGNOSIS — Z008 Encounter for other general examination: Secondary | ICD-10-CM | POA: Diagnosis not present

## 2022-05-14 DIAGNOSIS — R69 Illness, unspecified: Secondary | ICD-10-CM | POA: Diagnosis not present

## 2022-05-14 DIAGNOSIS — D6869 Other thrombophilia: Secondary | ICD-10-CM | POA: Diagnosis not present

## 2022-05-14 DIAGNOSIS — Z823 Family history of stroke: Secondary | ICD-10-CM | POA: Diagnosis not present

## 2022-05-14 DIAGNOSIS — Z8249 Family history of ischemic heart disease and other diseases of the circulatory system: Secondary | ICD-10-CM | POA: Diagnosis not present

## 2022-05-14 DIAGNOSIS — Z833 Family history of diabetes mellitus: Secondary | ICD-10-CM | POA: Diagnosis not present

## 2022-05-20 DIAGNOSIS — H353131 Nonexudative age-related macular degeneration, bilateral, early dry stage: Secondary | ICD-10-CM | POA: Diagnosis not present

## 2022-05-31 NOTE — Progress Notes (Deleted)
  Cardiology Office Note:   Date:  05/31/2022  ID:  Derek Mound, DOB 20-Oct-1935, MRN [id number]  History of Present Illness:   Rebecca Sandoval is a 87 y.o. female with a history of paroxysmal Afib, tachy-brady syndrome, HTN and anxiety who was previously followed by Dr. Tamala Julian who now presents to clinic for follow-up.  Was last seen in clinic in 05/2021 where she was doing well. Was undergoing cardiac clearance for knee surgery.  Today, ***  ROS: ***  Studies Reviewed:    EKG:  ***  Cardiac Studies & Procedures         MONITORS  LONG TERM MONITOR (3-14 DAYS) 07/30/2021  Narrative  The basic rhythm is normal sinus rhythm with average heart rate 68 bpm (range 49 to 136 bpm)  Nonsustained SVT with longest lasting 6 beats at a rate of 136 bpm.  Rare PACs and PVCs.  No atrial fibrillation identified.  Overall, the monitor is benign and there is no atrial fibrillation or worrisome ventricular arrhythmias.  No actions need to be taken.     Patch Wear Time:  3 days and 21 hours (2023-04-27T12:15:12-398 to 2023-05-01T10:01:00-0400)  Patient had a min HR of 49 bpm, max HR of 136 bpm, and avg HR of 68 bpm. Predominant underlying rhythm was Sinus Rhythm. 3 Supraventricular Tachycardia runs occurred, the run with the fastest interval lasting 6 beats with a max rate of 136 bpm (avg 121 bpm); the run with the fastest interval was also the longest. Isolated SVEs were rare (<1.0%), SVE Couplets were rare (<1.0%), and SVE Triplets were rare (<1.0%). Isolated VEs were rare (<1.0%), VE Couplets were rare (<1.0%), and no VE Triplets were present. Ventricular Bigeminy was present.           Risk Assessment/Calculations:   {Does this patient have ATRIAL FIBRILLATION?:[id number]} No BP recorded.  {Refresh Note OR Click here to enter BP  :1}***        Physical Exam:   VS:  There were no vitals taken for this visit.   Wt Readings from Last 3 Encounters:  05/09/22 148 lb (67.1 kg)   01/24/22 156 lb 12.8 oz (71.1 kg)  01/15/22 159 lb (72.1 kg)     GEN: Well nourished, well developed in no acute distress NECK: No JVD; No carotid bruits CARDIAC: ***RRR, no murmurs, rubs, gallops RESPIRATORY:  Clear to auscultation without rales, wheezing or rhonchi  ABDOMEN: Soft, non-tender, non-distended EXTREMITIES:  No edema; No deformity   ASSESSMENT AND PLAN:    #Paroxysmal Afib: CHADs-vasc 4. Currently doing well and maintaining NSR on flec. -Continue flec 50mg  BID -Continue warfarin  #Tachy-Brady Syndrome: Doing well and maintaining NSR. Not on BB.   #HTN: -Continue triamterene-HCTZ 37.5-25mg  daily     {Are you ordering a CV Procedure (e.g. stress test, cath, DCCV, TEE, etc)?   Press F2        :[id number]   Signed, Freada Bergeron, MD

## 2022-06-04 ENCOUNTER — Ambulatory Visit: Payer: Medicare HMO | Attending: Cardiology | Admitting: Cardiology

## 2022-06-09 ENCOUNTER — Other Ambulatory Visit: Payer: Self-pay | Admitting: *Deleted

## 2022-06-09 MED ORDER — TRIAMTERENE-HCTZ 37.5-25 MG PO TABS: 1.0000 | ORAL_TABLET | Freq: Every day | ORAL | 0 refills | Status: DC

## 2022-06-11 ENCOUNTER — Telehealth: Payer: Self-pay | Admitting: Cardiology

## 2022-06-11 NOTE — Telephone Encounter (Signed)
Pt c/o medication issue:  1. Name of Medication:  triamterene-hydrochlorothiazide (MAXZIDE-25) 37.5-25 MG tablet  2. How are you currently taking this medication (dosage and times per day)? Take 1 tablet by mouth daily.  3. Are you having a reaction (difficulty breathing--STAT)? No   4. What is your medication issue? Patient needs a new prescription filled at [id number] - [place], [place]

## 2022-06-12 ENCOUNTER — Other Ambulatory Visit: Payer: Self-pay | Admitting: Pharmacist

## 2022-06-12 ENCOUNTER — Other Ambulatory Visit: Payer: Self-pay

## 2022-06-12 DIAGNOSIS — I480 Paroxysmal atrial fibrillation: Secondary | ICD-10-CM

## 2022-06-12 MED ORDER — FLECAINIDE ACETATE 50 MG PO TABS: 50.0000 mg | ORAL_TABLET | Freq: Two times a day (BID) | ORAL | 0 refills | Status: DC

## 2022-06-12 MED ORDER — WARFARIN SODIUM 5 MG PO TABS: ORAL_TABLET | ORAL | 0 refills | Status: DC

## 2022-06-12 NOTE — Progress Notes (Deleted)
  Cardiology Office Note:   Date:  06/12/2022  ID:  Rebecca Sandoval, DOB 07-Sep-1935, MRN [id number]  History of Present Illness:   Rebecca Sandoval is a 87 y.o. female with a history of paroxysmal Afib, tachy-brady syndrome, HTN and anxiety who was previously followed by Dr. Tamala Julian who now presents to clinic for follow-up.  Was last seen in clinic in 05/2021 where she was doing well. Was undergoing cardiac clearance for knee surgery.  Today, ***  ROS: ***  Studies Reviewed:    EKG:  ***  Cardiac Studies & Procedures         MONITORS  LONG TERM MONITOR (3-14 DAYS) 07/30/2021  Narrative  The basic rhythm is normal sinus rhythm with average heart rate 68 bpm (range 49 to 136 bpm)  Nonsustained SVT with longest lasting 6 beats at a rate of 136 bpm.  Rare PACs and PVCs.  No atrial fibrillation identified.  Overall, the monitor is benign and there is no atrial fibrillation or worrisome ventricular arrhythmias.  No actions need to be taken.     Patch Wear Time:  3 days and 21 hours (2023-04-27T12:15:12-398 to 2023-05-01T10:01:00-0400)  Patient had a min HR of 49 bpm, max HR of 136 bpm, and avg HR of 68 bpm. Predominant underlying rhythm was Sinus Rhythm. 3 Supraventricular Tachycardia runs occurred, the run with the fastest interval lasting 6 beats with a max rate of 136 bpm (avg 121 bpm); the run with the fastest interval was also the longest. Isolated SVEs were rare (<1.0%), SVE Couplets were rare (<1.0%), and SVE Triplets were rare (<1.0%). Isolated VEs were rare (<1.0%), VE Couplets were rare (<1.0%), and no VE Triplets were present. Ventricular Bigeminy was present.           Risk Assessment/Calculations:   {Does this patient have ATRIAL FIBRILLATION?:[id number]} No BP recorded.  {Refresh Note OR Click here to enter BP  :1}***        Physical Exam:   VS:  There were no vitals taken for this visit.   Wt Readings from Last 3 Encounters:  05/09/22 148 lb (67.1 kg)   01/24/22 156 lb 12.8 oz (71.1 kg)  01/15/22 159 lb (72.1 kg)     GEN: Well nourished, well developed in no acute distress NECK: No JVD; No carotid bruits CARDIAC: ***RRR, no murmurs, rubs, gallops RESPIRATORY:  Clear to auscultation without rales, wheezing or rhonchi  ABDOMEN: Soft, non-tender, non-distended EXTREMITIES:  No edema; No deformity   ASSESSMENT AND PLAN:    #Paroxysmal Afib: CHADs-vasc 4. Currently doing well and maintaining NSR on flec. -Continue flec 50mg  BID -Continue warfarin  #Tachy-Brady Syndrome: Doing well and maintaining NSR. Not on BB.   #HTN: -Continue triamterene-HCTZ 37.5-25mg  daily     {Are you ordering a CV Procedure (e.g. stress test, cath, DCCV, TEE, etc)?   Press F2        :[id number]   Signed, Freada Bergeron, MD

## 2022-06-13 ENCOUNTER — Telehealth: Payer: Self-pay | Admitting: *Deleted

## 2022-06-13 ENCOUNTER — Ambulatory Visit: Payer: Medicare HMO | Attending: Cardiovascular Disease

## 2022-06-13 NOTE — Telephone Encounter (Signed)
Pt due to have INR checked today. Pt missed appt. Called pt who stated that she will not be able to come because she is in the hospital with her husband. Pt stated she will call back to reschedule coumadin appt.

## 2022-06-16 ENCOUNTER — Ambulatory Visit: Payer: Medicare HMO | Admitting: Cardiology

## 2022-06-24 ENCOUNTER — Ambulatory Visit: Payer: Medicare HMO | Admitting: Neurology

## 2022-06-30 NOTE — Progress Notes (Deleted)
Office Visit    Patient Name: Rebecca Sandoval Date of Encounter: 06/30/2022  Primary Care Provider:  Loyola Mast, MD Primary Cardiologist:  None Primary Electrophysiologist: None  Chief Complaint    Rebecca Sandoval is a 87 y.o. female with PMH of PAF (on Coumadin), GERD, anxiety, obesity, tachybradycardia syndrome who presents today for 1 year follow-up.  Past Medical History    Past Medical History:  Diagnosis Date   Anxiety    Complication of anesthesia    Difficult to wake up.   Diverticulitis    GERD (gastroesophageal reflux disease)    Hemorrhoid    Obesity    Paroxysmal atrial fibrillation (HCC)    Tachycardia-bradycardia Rebecca Sandoval)    Past Surgical History:  Procedure Laterality Date   ABDOMINAL HYSTERECTOMY  03/17/1973   APPENDECTOMY     bartholyn cyst  03/18/1971   BILATERAL TEMPOROMANDIBULAR JOINT ARTHROPLASTY  03/18/1975   BREAST LUMPECTOMY Left 07/28/2014   BREAST LUMPECTOMY WITH RADIOACTIVE SEED LOCALIZATION Left 07/28/2014   Procedure: LEFT BREAST LUMPECTOMY WITH RADIOACTIVE SEED LOCALIZATION;  Surgeon: Rebecca Lint, MD;  Location: [facility] OR;  Service: General;  Laterality: Left;   diverticulitis  03/17/1984   HERNIA REPAIR  03/17/1994   hiatal   KNEE SURGERY Right    12/2021   SHOULDER SURGERY     left 97, rigth 98   TONSILLECTOMY      Allergies  Allergies  Allergen Reactions   Prednisone     "pt stated this puts her in an uncomfortable zone"   Tetanus Toxoids Hives   Alprazolam Anxiety    Increased agitation   Septra [Sulfamethoxazole-Trimethoprim] Rash    History of Present Illness    Rebecca Sandoval  is a 87 year old female with the above mention past medical history who presents today for 1 year follow-up.  Rebecca Sandoval was initially seen in 2012 for complaint of atrial fibrillation.  She was initially diagnosed in 1996 and developed increased frequency.  She was consulted and was seen by Dr. Johney Sandoval and was started on flecainide and  Cardizem was stopped due to bradycardia.  She is on Coumadin for stroke prevention.  She was last seen by Dr. Katrinka Sandoval on 06/07/2021.  She required cardiac clearance for upcoming knee procedure.  She continues to maintain sinus rhythm on flecainide and bradycardia was noted on EKG with beta-blocker being discontinued.  She had an event monitor placed that showed no atrial fibrillation or ventricular arrhythmias.   Since last being seen in the office patient reports***.  Patient denies chest pain, palpitations, dyspnea, PND, orthopnea, nausea, vomiting, dizziness, syncope, edema, weight gain, or early satiety.   ***Notes:  Home Medications    Current Outpatient Medications  Medication Sig Dispense Refill   acetaminophen (TYLENOL) 325 MG tablet Take 650 mg by mouth every 6 (six) hours as needed (pain).     flecainide (TAMBOCOR) 50 MG tablet Take 1 tablet (50 mg total) by mouth 2 (two) times daily. 180 tablet 0   LORazepam (ATIVAN) 0.5 MG tablet Take 1 tablet (0.5 mg total) by mouth 2 (two) times daily as needed for anxiety. 20 tablet 1   ondansetron (ZOFRAN-ODT) 4 MG disintegrating tablet Take 4 mg by mouth every 6 (six) hours as needed for nausea/vomiting.     PARoxetine (PAXIL) 20 MG tablet TAKE 1 TABLET BY MOUTH EVERY DAY IN THE MORNING 90 tablet 3   triamterene-hydrochlorothiazide (MAXZIDE-25) 37.5-25 MG tablet Take 1 tablet by mouth daily. 30 tablet 0  warfarin (COUMADIN) 5 MG tablet Take 1&1/2 to 2 tablets by mouth daily as directed by the coumadin clinic. 200 tablet 0   No current facility-administered medications for this visit.     Review of Systems  Please see the history of present illness.    (+)*** (+)***  All other systems reviewed and are otherwise negative except as noted above.  Physical Exam    Wt Readings from Last 3 Encounters:  05/09/22 148 lb (67.1 kg)  01/24/22 156 lb 12.8 oz (71.1 kg)  01/15/22 159 lb (72.1 kg)   NW:GNFAO were no vitals filed for this  visit.,There is no height or weight on file to calculate BMI.  Constitutional:      Appearance: Healthy appearance. Not in distress.  Neck:     Vascular: JVD normal.  Pulmonary:     Effort: Pulmonary effort is normal.     Breath sounds: No wheezing. No rales. Diminished in the bases Cardiovascular:     Normal rate. Regular rhythm. Normal S1. Normal S2.      Murmurs: There is no murmur.  Edema:    Peripheral edema absent.  Abdominal:     Palpations: Abdomen is soft non tender. There is no hepatomegaly.  Skin:    General: Skin is warm and dry.  Neurological:     General: No focal deficit present.     Mental Status: Alert and oriented to person, place and time.     Cranial Nerves: Cranial nerves are intact.  EKG/LABS/ Recent Cardiac Studies    ECG personally reviewed by me today - ***  Risk Assessment/Calculations:   {Does this patient have ATRIAL FIBRILLATION?:[phone]}        Lab Results  Component Value Date   WBC 6.7 06/07/2021   HGB 14.1 06/07/2021   HCT 42.0 06/07/2021   MCV 94 06/07/2021   PLT 196 06/07/2021   Lab Results  Component Value Date   CREATININE 1.67 (H) 06/10/2021   BUN 21 06/10/2021   NA 137 06/10/2021   K 5.0 06/10/2021   CL 103 06/10/2021   CO2 25 06/10/2021   Lab Results  Component Value Date   ALT 36 (H) 03/14/2013   AST 34 03/14/2013   ALKPHOS 46 03/14/2013   BILITOT 0.3 03/14/2013   Lab Results  Component Value Date   CHOL 192 10/24/2020   HDL 40.50 10/24/2020   LDLDIRECT 115.0 10/24/2020   TRIG 235.0 (H) 10/24/2020   CHOLHDL 5 10/24/2020    Lab Results  Component Value Date   HGBA1C (H) 02/05/2009    6.3 (NOTE) The ADA recommends the following therapeutic goal for glycemic control related to Hgb A1c measurement: Goal of therapy: <6.5 Hgb A1c  Reference: American Diabetes Association: Clinical Practice Recommendations 2010, Diabetes Care, 2010, 33: (Suppl  1).    Cardiac Studies & Procedures         MONITORS  LONG  TERM MONITOR (3-14 DAYS) 07/30/2021  Narrative  The basic rhythm is normal sinus rhythm with average heart rate 68 bpm (range 49 to 136 bpm)  Nonsustained SVT with longest lasting 6 beats at a rate of 136 bpm.  Rare PACs and PVCs.  No atrial fibrillation identified.  Overall, the monitor is benign and there is no atrial fibrillation or worrisome ventricular arrhythmias.  No actions need to be taken.     Patch Wear Time:  3 days and 21 hours (2023-04-27T12:15:12-398 to 2023-05-01T10:01:00-0400)  Patient had a min HR of 49 bpm, max HR of  136 bpm, and avg HR of 68 bpm. Predominant underlying rhythm was Sinus Rhythm. 3 Supraventricular Tachycardia runs occurred, the run with the fastest interval lasting 6 beats with a max rate of 136 bpm (avg 121 bpm); the run with the fastest interval was also the longest. Isolated SVEs were rare (<1.0%), SVE Couplets were rare (<1.0%), and SVE Triplets were rare (<1.0%). Isolated VEs were rare (<1.0%), VE Couplets were rare (<1.0%), and no VE Triplets were present. Ventricular Bigeminy was present.           Assessment & Plan    1.  Paroxysmal AF: -Currently maintaining sinus rhythm on flecainide 50 mg twice daily and anticoagulated with Coumadin -Today patient reports***  2.  Essential hypertension: -Patient blood pressure today was***  3.  Tachybradycardia syndrome: -Patient was previously on beta-blocker which was discontinued and today is***  4.  CKD stage IV: -She is currently being followed by PCP.      Disposition: Follow-up with None or APP in *** months {Are you ordering a CV Procedure (e.g. stress test, cath, DCCV, TEE, etc)?   Press F2        :[id number]}   Medication Adjustments/Labs and Tests Ordered: Current medicines are reviewed at length with the patient today.  Concerns regarding medicines are outlined above.   Signed, Napoleon Form, Leodis Rains, NP 06/30/2022, 8:29 AM [facility]  Note:  This  document was prepared using Dragon voice recognition software and may include unintentional dictation errors.

## 2022-07-01 ENCOUNTER — Ambulatory Visit: Payer: Medicare HMO | Attending: Cardiology | Admitting: Nurse Practitioner

## 2022-07-01 DIAGNOSIS — N184 Chronic kidney disease, stage 4 (severe): Secondary | ICD-10-CM

## 2022-07-01 DIAGNOSIS — I480 Paroxysmal atrial fibrillation: Secondary | ICD-10-CM

## 2022-07-01 DIAGNOSIS — I495 Sick sinus syndrome: Secondary | ICD-10-CM

## 2022-07-01 DIAGNOSIS — I10 Essential (primary) hypertension: Secondary | ICD-10-CM

## 2022-07-04 ENCOUNTER — Other Ambulatory Visit: Payer: Self-pay

## 2022-07-07 ENCOUNTER — Other Ambulatory Visit: Payer: Self-pay | Admitting: *Deleted

## 2022-07-07 MED ORDER — TRIAMTERENE-HCTZ 37.5-25 MG PO TABS: 1.0000 | ORAL_TABLET | Freq: Every day | ORAL | 0 refills | Status: DC

## 2022-07-14 ENCOUNTER — Ambulatory Visit: Payer: Medicare HMO | Admitting: Cardiology

## 2022-07-16 ENCOUNTER — Other Ambulatory Visit: Payer: Self-pay | Admitting: Family Medicine

## 2022-07-16 DIAGNOSIS — F411 Generalized anxiety disorder: Secondary | ICD-10-CM

## 2022-07-16 NOTE — Telephone Encounter (Signed)
Refill request for  Lorazepam 0.5 mg LR  10/07/21, #20, 1 rf LOV  01/16/22 FOV  08/04/22  Please review and advise.  Thanks. Dm/cma

## 2022-08-01 ENCOUNTER — Other Ambulatory Visit: Payer: Self-pay | Admitting: Cardiology

## 2022-08-04 ENCOUNTER — Ambulatory Visit: Payer: Medicare HMO | Admitting: Family Medicine

## 2022-08-12 ENCOUNTER — Other Ambulatory Visit: Payer: Self-pay | Admitting: *Deleted

## 2022-08-18 ENCOUNTER — Other Ambulatory Visit: Payer: Self-pay | Admitting: Nurse Practitioner

## 2022-08-18 ENCOUNTER — Ambulatory Visit: Payer: Medicare HMO | Attending: Cardiology | Admitting: *Deleted

## 2022-08-18 ENCOUNTER — Telehealth: Payer: Self-pay | Admitting: *Deleted

## 2022-08-18 DIAGNOSIS — I4891 Unspecified atrial fibrillation: Secondary | ICD-10-CM

## 2022-08-18 DIAGNOSIS — Z5181 Encounter for therapeutic drug level monitoring: Secondary | ICD-10-CM | POA: Diagnosis not present

## 2022-08-18 DIAGNOSIS — I480 Paroxysmal atrial fibrillation: Secondary | ICD-10-CM | POA: Diagnosis not present

## 2022-08-18 LAB — POCT INR: POC INR: 1.3

## 2022-08-18 MED ORDER — TRIAMTERENE-HCTZ 37.5-25 MG PO TABS: 1.0000 | ORAL_TABLET | Freq: Every day | ORAL | 0 refills | Status: DC

## 2022-08-18 NOTE — Telephone Encounter (Signed)
Pt's medication was sent to pt's pharmacy with enough medication to get pt to her appt on 08/29/22, pt has enough of both medications until appt time. Confirmation received.

## 2022-08-18 NOTE — Telephone Encounter (Signed)
Pt stated she needs a refill for:   Flecainide 50mg  BID and for Triamterene HCTZ 37.5-25mg    Pt would like 90 day supply sent to  the [facility] on [address].

## 2022-08-18 NOTE — Patient Instructions (Signed)
Description   Take 2.5 tablets of warfarin today and tomorrow take 2 tablets of warfarin then CONTINUE taking Warfarin 1.5 tablets daily EXCEPT 2 tablets on Mondays and Wednesdays.  Recheck INR in 1 week.  Coumadin Clinic [phone].

## 2022-08-19 ENCOUNTER — Encounter: Payer: Self-pay | Admitting: Family Medicine

## 2022-08-19 ENCOUNTER — Ambulatory Visit (INDEPENDENT_AMBULATORY_CARE_PROVIDER_SITE_OTHER): Payer: Medicare HMO | Admitting: Family Medicine

## 2022-08-19 VITALS — BP 134/78 | HR 63 | Temp 98.1°F | Ht 65.0 in | Wt 145.0 lb

## 2022-08-19 DIAGNOSIS — I10 Essential (primary) hypertension: Secondary | ICD-10-CM

## 2022-08-19 DIAGNOSIS — I480 Paroxysmal atrial fibrillation: Secondary | ICD-10-CM

## 2022-08-19 DIAGNOSIS — F4321 Adjustment disorder with depressed mood: Secondary | ICD-10-CM | POA: Diagnosis not present

## 2022-08-19 DIAGNOSIS — F411 Generalized anxiety disorder: Secondary | ICD-10-CM

## 2022-08-19 DIAGNOSIS — E785 Hyperlipidemia, unspecified: Secondary | ICD-10-CM | POA: Insufficient documentation

## 2022-08-19 DIAGNOSIS — N184 Chronic kidney disease, stage 4 (severe): Secondary | ICD-10-CM

## 2022-08-19 DIAGNOSIS — Z13220 Encounter for screening for lipoid disorders: Secondary | ICD-10-CM

## 2022-08-19 MED ORDER — FLECAINIDE ACETATE 50 MG PO TABS: 50.0000 mg | ORAL_TABLET | Freq: Two times a day (BID) | ORAL | 0 refills | Status: DC

## 2022-08-19 MED ORDER — TRIAMTERENE-HCTZ 37.5-25 MG PO TABS: 1.0000 | ORAL_TABLET | Freq: Every day | ORAL | 3 refills | Status: DC

## 2022-08-19 NOTE — Assessment & Plan Note (Signed)
Continue focus on blood pressure control. I will check her renal function today.

## 2022-08-19 NOTE — Assessment & Plan Note (Addendum)
Rate well controlled. Continue flecainide 50 mg bid. She has had several cardiologist retire. I encouraged her to follow-through with getting seen. Continue Coumadin through the anticoagulation clinic.

## 2022-08-19 NOTE — Assessment & Plan Note (Signed)
I will rechekc lipids. Con

## 2022-08-19 NOTE — Assessment & Plan Note (Signed)
Continue Paxil 20 mg daily with judicious use of lorazepam 0.5 mg.

## 2022-08-19 NOTE — Assessment & Plan Note (Signed)
Blood pressure is in adequate control. Continue Maxzide 37.5-25 mg daily.

## 2022-08-19 NOTE — Progress Notes (Signed)
[facility] [address] [place] [address] Dept: [phone] Dept Fax: [phone]  Chronic Care Office Visit  Subjective:    Patient ID: Rebecca Sandoval, female    DOB: December 28, 1935, 87 y.o..   MRN: [id number]  Chief Complaint  Patient presents with   Medical Management of Chronic Issues    F/u meds.  No concerns.     History of Present Illness:  Patient is in today for reassessment of chronic medical issues.  Rebecca Sandoval husband passed on 12-Aug-2022. She notes that the last several months of his life had been very difficult for them to manage,a s his health declined. He eventually was placed on Hospice, but died within three days. He exhibited a great deal of anger late in life. She did go to stay with her sister for a time. Though the visit was not pleasant, she did not want to return to an empty home. She has a daughter, but they are estranged. She has a close neighbor that has been a support in all of this. She does have benefits for counseling and is considering taking this approach. She is currently on Paxil 20 mg daily and uses an occasional Ativan 0.5 mg.  Rebecca Sandoval has a history of atrial fibrillation and tachycardia-bradycardia syndrome. She is managed with flecanide 50 mg bid. She is on chronic anticoagulation therapy with warfarin which is managed thorugh an anticoagulation clinic.  Rebecca Sandoval has a history of hypertensin. She is managed on triamterene-HCTZ 37.5-25 mg daily. She also has a history of stage 4 CKD.  Past Medical History: Patient Active Problem List   Diagnosis Date Noted   Hyperlipidemia 08/19/2022   Osteoarthritis of right knee 04/24/2021   Left palmar fibromatosis 07/10/2020   Allergic rhinitis due to pollen 04/11/2020   Osteoarthritis of carpometacarpal [facility]) joint of right thumb 04/11/2020   History of cancer of left breast 04/11/2020   Essential hypertension 04/11/2020   Obesity     Diverticulitis    Hemorrhoid    GERD (gastroesophageal reflux disease)    Chronic kidney disease, stage 4 (severe) (HCC) 04/28/2017   GAD (generalized anxiety disorder) 04/27/2017   Chronic anticoagulation 05/23/2013   Encounter for therapeutic drug monitoring 05/05/2013   Paroxysmal atrial fibrillation (HCC) 09/23/2012   Tachycardia-bradycardia syndrome (HCC) 09/23/2012   Gout 08/11/2011   Past Surgical History:  Procedure Laterality Date   ABDOMINAL HYSTERECTOMY  03/17/1973   APPENDECTOMY     bartholyn cyst  03/18/1971   BILATERAL TEMPOROMANDIBULAR JOINT ARTHROPLASTY  03/18/1975   BREAST LUMPECTOMY Left 07/28/2014   BREAST LUMPECTOMY WITH RADIOACTIVE SEED LOCALIZATION Left 07/28/2014   Procedure: LEFT BREAST LUMPECTOMY WITH RADIOACTIVE SEED LOCALIZATION;  Surgeon: Almond Lint, MD;  Location: [facility] OR;  Service: General;  Laterality: Left;   diverticulitis  03/17/1984   HERNIA REPAIR  03/17/1994   hiatal   KNEE SURGERY Right    12/2021   SHOULDER SURGERY     left 97, rigth 98   TONSILLECTOMY     Family History  Problem Relation Age of Onset   Stroke Mother 8   Cancer Paternal Aunt    Cancer Paternal Uncle    Outpatient Medications Prior to Visit  Medication Sig Dispense Refill   acetaminophen (TYLENOL) 325 MG tablet Take 650 mg by mouth every 6 (six) hours as needed (pain).     LORazepam (ATIVAN) 0.5 MG tablet TAKE 1 TABLET BY MOUTH 2 TIMES DAILY AS NEEDED FOR ANXIETY. 20  tablet 2   ondansetron (ZOFRAN-ODT) 4 MG disintegrating tablet Take 4 mg by mouth every 6 (six) hours as needed for nausea/vomiting.     PARoxetine (PAXIL) 20 MG tablet TAKE 1 TABLET BY MOUTH EVERY DAY IN THE MORNING 90 tablet 3   warfarin (COUMADIN) 5 MG tablet Take 1&1/2 to 2 tablets by mouth daily as directed by the coumadin clinic. 200 tablet 0   flecainide (TAMBOCOR) 50 MG tablet Take 1 tablet (50 mg total) by mouth 2 (two) times daily. 180 tablet 0   triamterene-hydrochlorothiazide (MAXZIDE-25)  37.5-25 MG tablet Take 1 tablet by mouth daily. P 15 tablet 0   No facility-administered medications prior to visit.   Allergies  Allergen Reactions   Prednisone     "pt stated this puts her in an uncomfortable zone"   Tetanus Toxoids Hives   Alprazolam Anxiety    Increased agitation   Septra [Sulfamethoxazole-Trimethoprim] Rash   Objective:   Today's Vitals   08/19/22 1514  BP: 134/78  Pulse: 63  Temp: 98.1 F (36.7 C)  TempSrc: Temporal  SpO2: 98%  Weight: 145 lb (65.8 kg)  Height: 5\' 5"  (1.651 m)   Body mass index is 24.13 kg/m.   General: Well developed, well nourished. No acute distress. Psych: Alert and oriented. Depressed mood and affect with moderate tearfulness. Denies suicidality.  Health Maintenance Due  Topic Date Due   DTaP/Tdap/Td (1 - Tdap) Never done   Zoster Vaccines- Shingrix (1 of 2) Never done   Pneumonia Vaccine 29+ Years old (3 of 3 - PPSV23 or PCV20) 03/25/2019     Assessment & Plan:   Problem List Items Addressed This Visit       Cardiovascular and Mediastinum   Paroxysmal atrial fibrillation (HCC)    Rate well controlled. Continue flecainide 50 mg bid. She has had several cardiologist retire. I encouraged her to follow-through with getting seen. Continue Coumadin through the anticoagulation clinic.      Relevant Medications   flecainide (TAMBOCOR) 50 MG tablet   triamterene-hydrochlorothiazide (MAXZIDE-25) 37.5-25 MG tablet   Essential hypertension - Primary    Blood pressure is in adequate control. Continue Maxzide 37.5-25 mg daily.       Relevant Medications   flecainide (TAMBOCOR) 50 MG tablet   triamterene-hydrochlorothiazide (MAXZIDE-25) 37.5-25 MG tablet   Other Relevant Orders   Basic metabolic panel     Genitourinary   Chronic kidney disease, stage 4 (severe) (HCC)    Continue focus on blood pressure control. I will check her renal function today.      Relevant Orders   Basic metabolic panel   CBC   Parathyroid  hormone, intact (no Ca)   Phosphorus     Other   GAD (generalized anxiety disorder)    Continue Paxil 20 mg daily with judicious use of lorazepam 0.5 mg.      Other Visit Diagnoses     Grief       I encouraged her to consider group visits for grief and to continue to seek support form her neighbor.   Screening for lipid disorders       Relevant Orders   Lipid panel      Return in about 4 weeks (around 09/16/2022) for Reassessment.   Loyola Mast, MD

## 2022-08-20 LAB — BASIC METABOLIC PANEL
BUN: 18 mg/dL (ref 6–23)
CO2: 24 mEq/L (ref 19–32)
Calcium: 9 mg/dL (ref 8.4–10.5)
Chloride: 105 mEq/L (ref 96–112)
Creatinine, Ser: 1.33 mg/dL — ABNORMAL HIGH (ref 0.40–1.20)
GFR: 36.51 mL/min — ABNORMAL LOW (ref >60.00)
Glucose, Bld: 97 mg/dL (ref 70–99)
Potassium: 4.4 mEq/L (ref 3.5–5.1)
Sodium: 142 mEq/L (ref 135–145)

## 2022-08-20 LAB — LIPID PANEL
Cholesterol: 161 mg/dL (ref 0–200)
HDL: 41.9 mg/dL (ref >39.00)
LDL Cholesterol: 97 mg/dL (ref 0–99)
NonHDL: 119.32
Total CHOL/HDL Ratio: 4
Triglycerides: 113 mg/dL (ref 0.0–149.0)
VLDL: 22.6 mg/dL (ref 0.0–40.0)

## 2022-08-20 LAB — CBC
HCT: 40.7 % (ref 36.0–46.0)
Hemoglobin: 13.6 g/dL (ref 12.0–15.0)
MCHC: 33.5 g/dL (ref 30.0–36.0)
MCV: 94.8 fl (ref 78.0–100.0)
Platelets: 174 10*3/uL (ref 150.0–400.0)
RBC: 4.3 Mil/uL (ref 3.87–5.11)
RDW: 13.2 % (ref 11.5–15.5)
WBC: 6 10*3/uL (ref 4.0–10.5)

## 2022-08-20 LAB — PARATHYROID HORMONE, INTACT (NO CA): PTH: 75 pg/mL (ref 16–77)

## 2022-08-20 LAB — EXTRA LAV TOP TUBE

## 2022-08-20 LAB — PHOSPHORUS: Phosphorus: 3 mg/dL (ref 2.3–4.6)

## 2022-08-25 ENCOUNTER — Ambulatory Visit: Payer: Medicare HMO

## 2022-08-28 NOTE — Progress Notes (Signed)
Office Visit    Patient Name: Rebecca Sandoval Date of Encounter: 08/29/2022  Primary Care Provider:  Loyola Mast, MD Primary Cardiologist:  None Primary Electrophysiologist: None   Past Medical History    Past Medical History:  Diagnosis Date   Anxiety    Complication of anesthesia    Difficult to wake up.   Diverticulitis    GERD (gastroesophageal reflux disease)    Hemorrhoid    Obesity    Paroxysmal atrial fibrillation (HCC)    Tachycardia-bradycardia [facility])    Past Surgical History:  Procedure Laterality Date   ABDOMINAL HYSTERECTOMY  03/17/1973   APPENDECTOMY     bartholyn cyst  03/18/1971   BILATERAL TEMPOROMANDIBULAR JOINT ARTHROPLASTY  03/18/1975   BREAST LUMPECTOMY Left 07/28/2014   BREAST LUMPECTOMY WITH RADIOACTIVE SEED LOCALIZATION Left 07/28/2014   Procedure: LEFT BREAST LUMPECTOMY WITH RADIOACTIVE SEED LOCALIZATION;  Surgeon: Almond Lint, MD;  Location: [facility] OR;  Service: General;  Laterality: Left;   diverticulitis  03/17/1984   HERNIA REPAIR  03/17/1994   hiatal   KNEE SURGERY Right    12/2021   SHOULDER SURGERY     left 97, rigth 98   TONSILLECTOMY      Allergies  Allergies  Allergen Reactions   Prednisone     "pt stated this puts her in an uncomfortable zone"   Tetanus Toxoids Hives   Alprazolam Anxiety    Increased agitation   Septra [Sulfamethoxazole-Trimethoprim] Rash     History of Present Illness    Rebecca Sandoval  is a 87 year old female with a PMH of PAF (on Coumadin), HTN, tachybradycardia syndrome,   CKD stage IV,GERD, anxiety who presents today for 1 year follow-up.  Rebecca Sandoval was followed by Dr. Katrinka Blazing since 2015 for management of paroxysmal atrial fibrillation and tachybradycardia syndrome.  She was diagnosed with AF in 1996 and has been managed previously with metoprolol which was discontinued due to tach and bradycardia.  She is currently being managed with  Flecainide and is on Coumadin for anticoagulation. she was  last seen by Dr. Katrinka Blazing on 06/07/2021   For surgical clearance.  She was doing well and maintaining sinus rhythm with flecainide.  She had low-dose beta-blocker discontinued and blood pressure was noted to be adequately controlled.  She suffered a fall on 08/20/2021 and struck her head. She was transported  to the ED via EMS and had CT of the head completed that was normal along with CT of the spine showing no fractures.    Rebecca Sandoval presents today for a 1 year follow-up.  Since last being seen in the office patient reports she has been doing well since her previous visit.  She is still grieving the loss of her husband who passed away earlier this year.  She was in for a 56 years and is taking grief counseling currently.  She has a Yorkie named Public affairs consultant that is also helping her through her difficult time.  She is tolerating her medications without any adverse reactions.  Her blood pressure today is controlled at 120/60 and heart rate is 60 bpm.  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  During today's visit we discussed atrial fibrillation and the importance of compliance with her current medications.  Patient denies chest pain, palpitations, dyspnea, PND, orthopnea, nausea, vomiting, dizziness, syncope, edema, weight gain, or early satiety.  Home Medications    Current Outpatient Medications  Medication Sig Dispense Refill   acetaminophen (TYLENOL)  325 MG tablet Take 650 mg by mouth every 6 (six) hours as needed (pain).     flecainide (TAMBOCOR) 50 MG tablet Take 1 tablet (50 mg total) by mouth 2 (two) times daily. 180 tablet 0   LORazepam (ATIVAN) 0.5 MG tablet TAKE 1 TABLET BY MOUTH 2 TIMES DAILY AS NEEDED FOR ANXIETY. 20 tablet 2   ondansetron (ZOFRAN-ODT) 4 MG disintegrating tablet Take 4 mg by mouth every 6 (six) hours as needed for nausea/vomiting.     PARoxetine (PAXIL) 20 MG tablet TAKE 1 TABLET BY MOUTH EVERY DAY IN THE MORNING 90 tablet 3   warfarin (COUMADIN) 5 MG tablet Take  1&1/2 to 2 tablets by mouth daily as directed by the coumadin clinic. 200 tablet 0   triamterene-hydrochlorothiazide (MAXZIDE-25) 37.5-25 MG tablet Take 1 tablet by mouth daily. 90 tablet 3   No current facility-administered medications for this visit.     Review of Systems  Please see the history of present illness.    All other systems reviewed and are otherwise negative except as noted above.  Physical Exam    Wt Readings from Last 3 Encounters:  08/29/22 144 lb (65.3 kg)  08/19/22 145 lb (65.8 kg)  05/09/22 148 lb (67.1 kg)   VS: Vitals:   08/29/22 0837  BP: 120/60  Pulse: 60  SpO2: 95%  ,Body mass index is 23.24 kg/m.  Constitutional:      Appearance: Healthy appearance. Not in distress.  Neck:     Vascular: JVD normal.  Pulmonary:     Effort: Pulmonary effort is normal.     Breath sounds: No wheezing. No rales. Diminished in the bases Cardiovascular:     Normal rate. Regular rhythm. Normal S1. Normal S2.      Murmurs: There is no murmur.  Edema:    Peripheral edema absent.  Abdominal:     Palpations: Abdomen is soft non tender. There is no hepatomegaly.  Skin:    General: Skin is warm and dry.  Neurological:     General: No focal deficit present.     Mental Status: Alert and oriented to person, place and time.     Cranial Nerves: Cranial nerves are intact.  EKG/LABS/ Recent Cardiac Studies    ECG personally reviewed by me today -sinus rhythm with PACs and rate of 60 bpm with no acute changes and rate of 60 bpm and QT interval of 460 ms  Cardiac Studies & Procedures         MONITORS  LONG TERM MONITOR (3-14 DAYS) 07/30/2021  Narrative  The basic rhythm is normal sinus rhythm with average heart rate 68 bpm (range 49 to 136 bpm)  Nonsustained SVT with longest lasting 6 beats at a rate of 136 bpm.  Rare PACs and PVCs.  No atrial fibrillation identified.  Overall, the monitor is benign and there is no atrial fibrillation or worrisome ventricular  arrhythmias.  No actions need to be taken.     Patch Wear Time:  3 days and 21 hours (2023-04-27T12:15:12-398 to 2023-05-01T10:01:00-0400)  Patient had a min HR of 49 bpm, max HR of 136 bpm, and avg HR of 68 bpm. Predominant underlying rhythm was Sinus Rhythm. 3 Supraventricular Tachycardia runs occurred, the run with the fastest interval lasting 6 beats with a max rate of 136 bpm (avg 121 bpm); the run with the fastest interval was also the longest. Isolated SVEs were rare (<1.0%), SVE Couplets were rare (<1.0%), and SVE Triplets were rare (<1.0%). Isolated VEs  were rare (<1.0%), VE Couplets were rare (<1.0%), and no VE Triplets were present. Ventricular Bigeminy was present.           Risk Assessment/Calculations:    CHA2DS2-VASc Score = 4   This indicates a 4.8% annual risk of stroke. The patient's score is based upon: CHF History: 0 HTN History: 1 Diabetes History: 0 Stroke History: 0 Vascular Disease History: 0 Age Score: 2 Gender Score: 1           Lab Results  Component Value Date   WBC 6.0 08/19/2022   HGB 13.6 08/19/2022   HCT 40.7 08/19/2022   MCV 94.8 08/19/2022   PLT 174.0 08/19/2022   Lab Results  Component Value Date   CREATININE 1.33 (H) 08/19/2022   BUN 18 08/19/2022   NA 142 08/19/2022   K 4.4 08/19/2022   CL 105 08/19/2022   CO2 24 08/19/2022   Lab Results  Component Value Date   ALT 36 (H) 03/14/2013   AST 34 03/14/2013   ALKPHOS 46 03/14/2013   BILITOT 0.3 03/14/2013   Lab Results  Component Value Date   CHOL 161 08/19/2022   HDL 41.90 08/19/2022   LDLCALC 97 08/19/2022   LDLDIRECT 115.0 10/24/2020   TRIG 113.0 08/19/2022   CHOLHDL 4 08/19/2022    Lab Results  Component Value Date   HGBA1C (H) 02/05/2009    6.3 (NOTE) The ADA recommends the following therapeutic goal for glycemic control related to Hgb A1c measurement: Goal of therapy: <6.5 Hgb A1c  Reference: American Diabetes Association: Clinical Practice Recommendations  2010, Diabetes Care, 2010, 33: (Suppl  1).     Assessment & Plan    1. Paroxysmal AF: -Today patient is sinus rhythm with PACs on flecainide 50 mg twice daily. -She reports no chest pain or palpitations. -Continue Coumadin per Coumadin clinic -CHA2DS2-VASc Score = 4 [CHF History: 0, HTN History: 1, Diabetes History: 0, Stroke History: 0, Vascular Disease History: 0, Age Score: 2, Gender Score: 1].  Therefore, the patient's annual risk of stroke is 4.8 %.      2. Tachybradycardia syndrome: -Continue flecainide 50 mg twice daily as noted above -Patient denies any dizziness or tachycardia episodes.  3. Essential hypertension: -Patient's blood pressure today was well-controlled at 120/60 -Continue Maxide 37.5-25 mg daily  4.  Chronic anticoagulation: -Patient currently on Coumadin and dosed by Coumadin clinic -She was advised to always follow-up in the ED if she suffers a fall and strikes her head. -CHA2DS2-VASc score of 4   Disposition: Follow-up with None or APP in 12 months    Medication Adjustments/Labs and Tests Ordered: Current medicines are reviewed at length with the patient today.  Concerns regarding medicines are outlined above.   Signed, Napoleon Form, Leodis Rains, NP 08/29/2022, 9:14 AM [facility]

## 2022-08-29 ENCOUNTER — Ambulatory Visit: Payer: Medicare HMO | Attending: Nurse Practitioner | Admitting: Nurse Practitioner

## 2022-08-29 ENCOUNTER — Encounter: Payer: Self-pay | Admitting: Nurse Practitioner

## 2022-08-29 ENCOUNTER — Ambulatory Visit: Payer: Medicare HMO

## 2022-08-29 VITALS — BP 120/60 | HR 60 | Ht 66.0 in | Wt 144.0 lb

## 2022-08-29 DIAGNOSIS — I10 Essential (primary) hypertension: Secondary | ICD-10-CM

## 2022-08-29 DIAGNOSIS — Z5181 Encounter for therapeutic drug level monitoring: Secondary | ICD-10-CM | POA: Diagnosis not present

## 2022-08-29 DIAGNOSIS — Z7901 Long term (current) use of anticoagulants: Secondary | ICD-10-CM

## 2022-08-29 DIAGNOSIS — I4891 Unspecified atrial fibrillation: Secondary | ICD-10-CM

## 2022-08-29 DIAGNOSIS — I495 Sick sinus syndrome: Secondary | ICD-10-CM

## 2022-08-29 DIAGNOSIS — I480 Paroxysmal atrial fibrillation: Secondary | ICD-10-CM | POA: Diagnosis not present

## 2022-08-29 LAB — POCT INR: INR: 1.6 — AB (ref 2.0–3.0)

## 2022-08-29 MED ORDER — TRIAMTERENE-HCTZ 37.5-25 MG PO TABS: 1.0000 | ORAL_TABLET | Freq: Every day | ORAL | 3 refills | Status: AC

## 2022-08-29 NOTE — Patient Instructions (Signed)
Medication Instructions:  Your physician recommends that you continue on your current medications as directed. Please refer to the Current Medication list given to you today. *If you need a refill on your cardiac medications before your next appointment, please call your pharmacy*   Lab Work: None Ordered If you have labs (blood work) drawn today and your tests are completely normal, you will receive your results only by: MyChart Message (if you have MyChart) OR A paper copy in the mail If you have any lab test that is abnormal or we need to change your treatment, we will call you to review the results.   Testing/Procedures: None ordered   Follow-Up: At [facility], you and your health needs are our priority.  As part of our continuing mission to provide you with exceptional heart care, we have created designated Provider Care Teams.  These Care Teams include your primary Cardiologist (physician) and Advanced Practice Providers (APPs -  Physician Assistants and Nurse Practitioners) who all work together to provide you with the care you need, when you need it.  We recommend signing up for the patient portal called "MyChart".  Sign up information is provided on this After Visit Summary.  MyChart is used to connect with patients for Virtual Visits (Telemedicine).  Patients are able to view lab/test results, encounter notes, upcoming appointments, etc.  Non-urgent messages can be sent to your provider as well.   To learn more about what you can do with MyChart, go to [email or web address].    Your next appointment:   12 month(s)  Provider:   Mylinda Latina provider accepting new patients Other Instructions

## 2022-08-29 NOTE — Patient Instructions (Signed)
Description   Take 2.5 tablets of warfarin today and take 2 tablets tomorrow and then CONTINUE taking Warfarin 1.5 tablets daily EXCEPT 2 tablets on Mondays and Wednesdays.  Recheck INR in 10 days  Coumadin Clinic [phone].

## 2022-09-07 ENCOUNTER — Other Ambulatory Visit: Payer: Self-pay | Admitting: Family Medicine

## 2022-09-07 DIAGNOSIS — I480 Paroxysmal atrial fibrillation: Secondary | ICD-10-CM

## 2022-09-08 ENCOUNTER — Telehealth: Payer: Self-pay | Admitting: *Deleted

## 2022-09-08 ENCOUNTER — Ambulatory Visit: Payer: Medicare HMO

## 2022-09-08 NOTE — Telephone Encounter (Signed)
Called pt since she missed her appt today in the Anticoagulation Clinic; there was no answer so left a message.

## 2022-09-16 ENCOUNTER — Ambulatory Visit: Payer: Medicare HMO | Admitting: Family Medicine

## 2022-09-16 ENCOUNTER — Ambulatory Visit: Payer: Medicare HMO | Attending: Cardiology | Admitting: *Deleted

## 2022-09-16 DIAGNOSIS — I4891 Unspecified atrial fibrillation: Secondary | ICD-10-CM

## 2022-09-16 DIAGNOSIS — I480 Paroxysmal atrial fibrillation: Secondary | ICD-10-CM

## 2022-09-16 DIAGNOSIS — Z5181 Encounter for therapeutic drug level monitoring: Secondary | ICD-10-CM | POA: Diagnosis not present

## 2022-09-16 LAB — POCT INR: INR: 1.7 — AB (ref 2.0–3.0)

## 2022-09-16 NOTE — Patient Instructions (Addendum)
Description   Take 2 tablets of warfarin today then START taking Warfarin 1.5 tablets daily EXCEPT 2 tablets on Mondays, Wednesdays, and Fridays. Recheck INR in 3 weeks while out of town in [place]. Coumadin Clinic [phone] or [phone].

## 2022-10-01 ENCOUNTER — Other Ambulatory Visit: Payer: Self-pay

## 2022-10-01 DIAGNOSIS — I480 Paroxysmal atrial fibrillation: Secondary | ICD-10-CM

## 2022-10-01 MED ORDER — WARFARIN SODIUM 5 MG PO TABS: ORAL_TABLET | ORAL | 0 refills | Status: AC

## 2022-10-01 NOTE — Telephone Encounter (Signed)
Prescription refill request received for warfarin Lov: 08/29/22 Louanne Skye)  Next INR check: 10/07/22 Warfarin tablet strength: 5mg   Appropriate dose. Refill sent.

## 2022-10-07 ENCOUNTER — Telehealth: Payer: Self-pay | Admitting: *Deleted
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
## 2022-10-08 DIAGNOSIS — I480 Paroxysmal atrial fibrillation: Secondary | ICD-10-CM | POA: Diagnosis not present

## 2022-10-08 DIAGNOSIS — I4891 Unspecified atrial fibrillation: Secondary | ICD-10-CM | POA: Diagnosis not present

## 2022-10-08 DIAGNOSIS — Z5181 Encounter for therapeutic drug level monitoring: Secondary | ICD-10-CM | POA: Diagnosis not present

## 2022-10-08 LAB — PROTIME-INR: INR: 1.4 — AB (ref 0.80–1.20)

## 2022-10-09 ENCOUNTER — Ambulatory Visit (INDEPENDENT_AMBULATORY_CARE_PROVIDER_SITE_OTHER): Payer: Medicare HMO | Admitting: *Deleted

## 2022-10-09 ENCOUNTER — Telehealth: Payer: Self-pay | Admitting: *Deleted

## 2022-10-09 DIAGNOSIS — I480 Paroxysmal atrial fibrillation: Secondary | ICD-10-CM

## 2022-10-09 DIAGNOSIS — Z5181 Encounter for therapeutic drug level monitoring: Secondary | ICD-10-CM

## 2022-10-09 NOTE — Patient Instructions (Signed)
Description   Take 2 tablets of warfarin today then START taking Warfarin 1.5 tablets daily EXCEPT 2 tablets on Mondays, Wednesdays, Fridays, and Saturdays.  Recheck INR in 2 weeks while out of town in [place]. Coumadin Clinic [phone] or [phone].

## 2022-10-09 NOTE — Telephone Encounter (Signed)
Spoke with Rebecca Sandoval at [facility] and she states we can fax an order over to them for her next INR draw. Fax number is [phone]  [name] spoke with pt regarding the lab results today and she is aware that she will have to go to the lab since she is not planning to return to [place] within the next month.

## 2022-10-23 ENCOUNTER — Ambulatory Visit: Payer: Medicare HMO

## 2022-10-23 ENCOUNTER — Telehealth: Payer: Self-pay

## 2022-10-23 NOTE — Telephone Encounter (Signed)
Called pt due to INR being due today. Pt had appt scheduled with Coumadin Clinic today; however pt is still on vacation in [place]. Pt states she will got to Lab on Monday in [place], 10/27/22 to have PT/INR drawn.

## 2022-10-24 ENCOUNTER — Telehealth: Payer: Self-pay | Admitting: Cardiology

## 2022-10-24 NOTE — Telephone Encounter (Signed)
Returned pt's call, no answer. Left message on voicemail.

## 2022-10-24 NOTE — Telephone Encounter (Signed)
Patient is requesting a call back from the nurse in regards to labs.

## 2022-10-27 ENCOUNTER — Telehealth: Payer: Self-pay

## 2022-10-27 NOTE — Telephone Encounter (Signed)
Called and spoke with pt concerning INR. Pt states she will not be able to go to lab today; however, she will go tomorrow.

## 2022-10-28 ENCOUNTER — Ambulatory Visit (INDEPENDENT_AMBULATORY_CARE_PROVIDER_SITE_OTHER): Payer: Medicare HMO | Admitting: Cardiovascular Disease

## 2022-10-28 ENCOUNTER — Telehealth: Payer: Self-pay

## 2022-10-28 DIAGNOSIS — I480 Paroxysmal atrial fibrillation: Secondary | ICD-10-CM | POA: Diagnosis not present

## 2022-10-28 DIAGNOSIS — Z5181 Encounter for therapeutic drug level monitoring: Secondary | ICD-10-CM | POA: Diagnosis not present

## 2022-10-28 LAB — PROTIME-INR: INR: 2.2 — AB (ref 0.80–1.20)

## 2022-10-28 NOTE — Telephone Encounter (Signed)
Lpmtcb and discuss INR result. °

## 2022-10-29 NOTE — Patient Instructions (Signed)
Description   Called and spoke with pt. Instructed to continue taking Warfarin 1.5 tablets daily EXCEPT 2 tablets on Mondays, Wednesdays, Fridays, and Saturdays.  Recheck INR in 3 weeks while out of town in [place]. Coumadin Clinic [phone] or [phone].

## 2022-11-13 ENCOUNTER — Other Ambulatory Visit: Payer: Self-pay | Admitting: Family Medicine

## 2022-11-13 DIAGNOSIS — I10 Essential (primary) hypertension: Secondary | ICD-10-CM

## 2022-11-18 ENCOUNTER — Telehealth: Payer: Self-pay

## 2022-11-18 NOTE — Telephone Encounter (Signed)
Lpm to have INR drawn today. 

## 2022-11-19 ENCOUNTER — Ambulatory Visit (INDEPENDENT_AMBULATORY_CARE_PROVIDER_SITE_OTHER): Payer: Medicare HMO

## 2022-11-19 ENCOUNTER — Telehealth: Payer: Self-pay | Admitting: *Deleted

## 2022-11-19 DIAGNOSIS — Z5181 Encounter for therapeutic drug level monitoring: Secondary | ICD-10-CM | POA: Diagnosis not present

## 2022-11-19 DIAGNOSIS — I480 Paroxysmal atrial fibrillation: Secondary | ICD-10-CM

## 2022-11-19 LAB — POCT INR: INR: 2.5 (ref 2.0–3.0)

## 2022-11-19 NOTE — Telephone Encounter (Signed)
Called pt since INR is overdue; there was no answer so left a message to call back regarding this.

## 2022-11-20 NOTE — Patient Instructions (Signed)
Description   Called and spoke with pt. Instructed to continue taking Warfarin 1.5 tablets daily EXCEPT 2 tablets on Mondays, Wednesdays, Fridays, and Saturdays.  Recheck INR in 4 weeks while out of town in [place]. Coumadin Clinic [phone] or [phone].

## 2022-12-17 ENCOUNTER — Telehealth: Payer: Self-pay

## 2022-12-17 NOTE — Telephone Encounter (Signed)
INR due. Called pt to confirm she has become established with new PCP or cardiologist since she has moved, no answer. Unable to leave message on voicemail.

## 2022-12-18 ENCOUNTER — Telehealth: Payer: Self-pay

## 2022-12-18 NOTE — Telephone Encounter (Signed)
Called pt and confirmed she has moved out of state and now has become established with new PCP who will be monitoring INR/Warfarin. Confirmed she has an appt with new PCP on 01/14/23. Pt also states her new PCP will be referring he to a new Cardiologist.

## 2023-03-25 ENCOUNTER — Other Ambulatory Visit: Payer: Self-pay | Admitting: Family Medicine

## 2023-03-25 DIAGNOSIS — F419 Anxiety disorder, unspecified: Secondary | ICD-10-CM

## 2023-03-27 NOTE — Telephone Encounter (Signed)
 Lft VM to RTN call to schedule appointment. Dm/cma

## 2023-03-30 NOTE — Telephone Encounter (Signed)
 Lft VM to RTN call to schedule appointment. Dm/cma

## 2023-04-06 NOTE — Telephone Encounter (Signed)
Lft VM to RTN call to schedule appointment. Dm/cma

## 2023-04-06 NOTE — Telephone Encounter (Signed)
Letter mailed to patient to call and schedule an appointment due to not being able to reach patient. Dm/cma

## 2023-08-05 ENCOUNTER — Encounter: Payer: Self-pay | Admitting: Cardiology

## 2023-08-13 ENCOUNTER — Telehealth: Payer: Self-pay

## 2023-08-13 NOTE — Telephone Encounter (Signed)
 Received PT/INR via fax from [facility] in [place]. Pt was followed by our coumadin  clinic last year but moved to [place] in 12/2022. I called and spoke with Kathaleen Pale at [facility]. She states pt is a patient at their clinic and they will see the results. She apologized for the confusion.

## 2023-09-06 ENCOUNTER — Other Ambulatory Visit: Payer: Self-pay | Admitting: Family Medicine

## 2023-09-06 DIAGNOSIS — F419 Anxiety disorder, unspecified: Secondary | ICD-10-CM

## 2023-09-07 NOTE — Telephone Encounter (Signed)
 Left VM to rtn call to make a appointment. Dm/cma

## 2023-09-08 NOTE — Telephone Encounter (Signed)
 Left VM to rtn call to make a appointment. Dm/cma

## 2023-10-19 IMAGING — CT CT HEAD W/O CM
4 series · 17 of 47 positions shown, 19 images · non-contrast
Comparison: None Available.

CLINICAL DATA: Trauma, fall



[Series 3: head wo · axial · 0.44mm/px · z∈[+10,+124]mm · 7 of 31 slices shown, 9 images]
[im 4/31  brain]
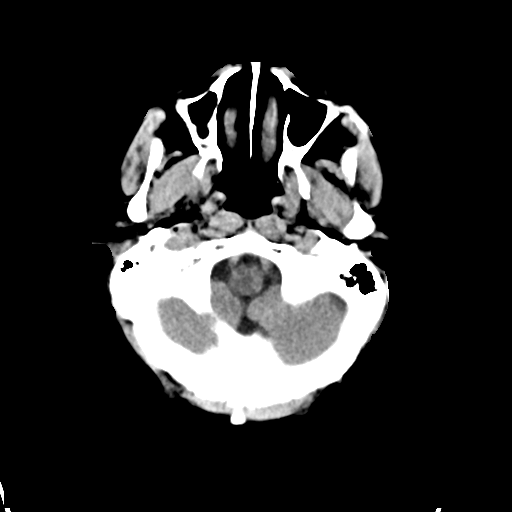
[im 4/31  bone]
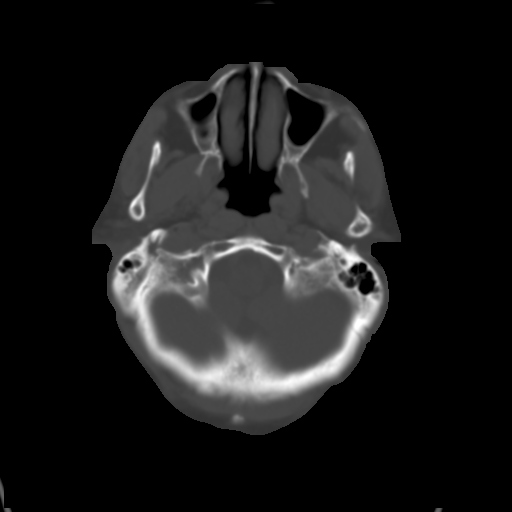
[im 8/31  brain]
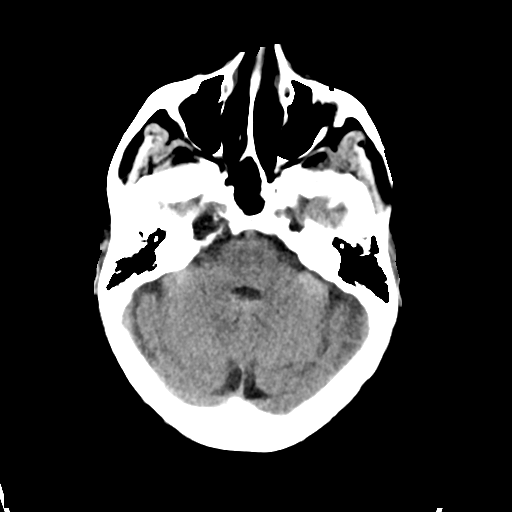
[im 12/31  brain]
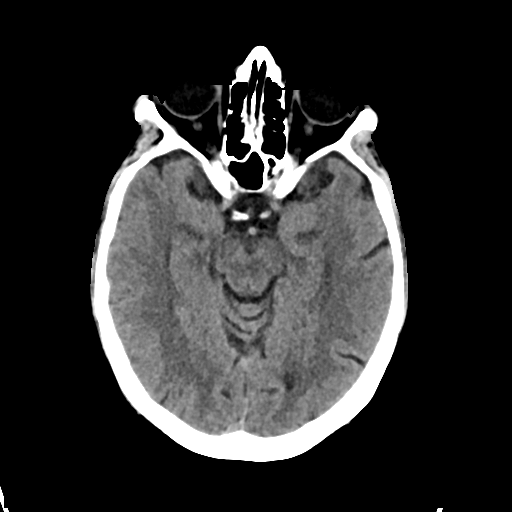
[im 16/31  brain]
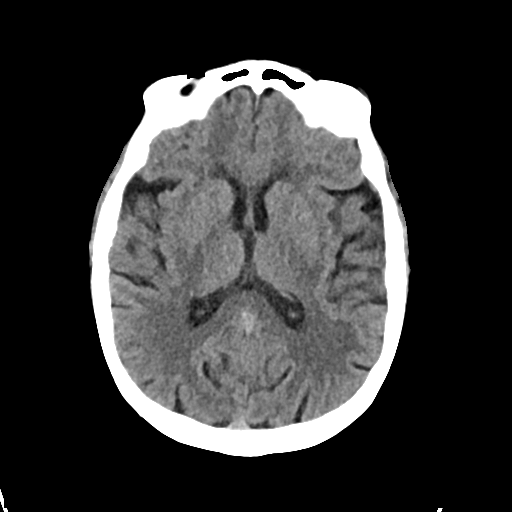
[im 19/31  brain]
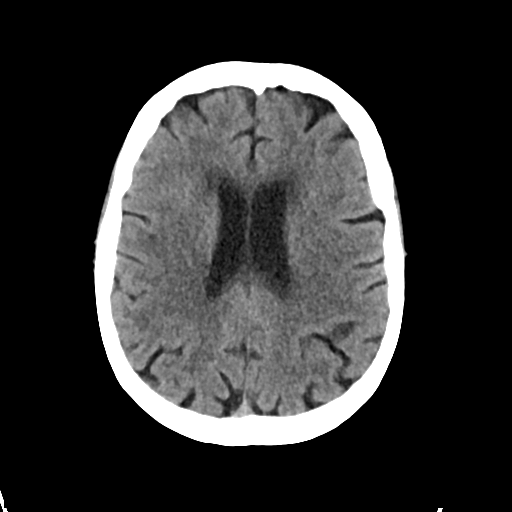
[im 19/31  bone]
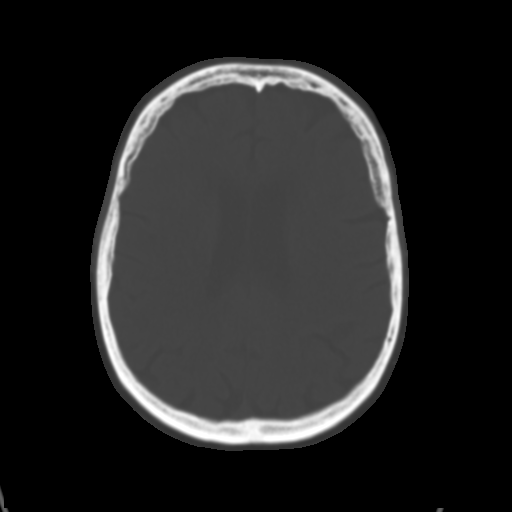
[im 23/31  brain]
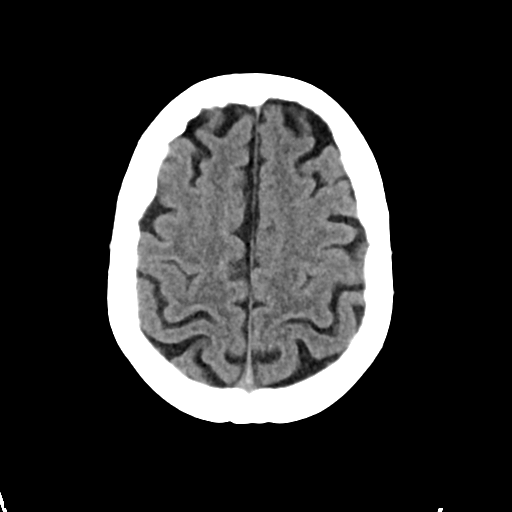
[im 27/31  brain]
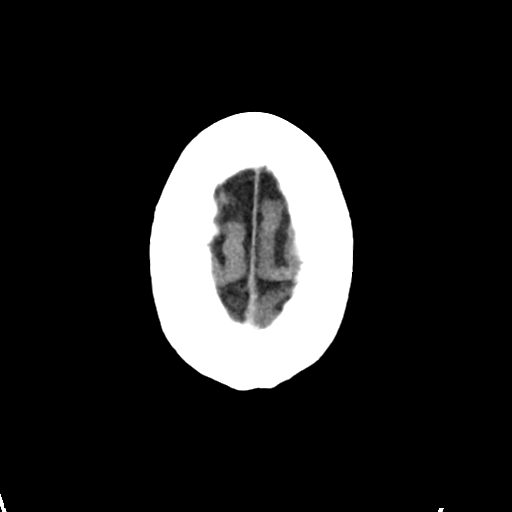

[Series 4: head bone · axial · 0.44mm/px · z∈[+8,+62]mm · 4 of 77 slices shown]
[im 8/77  bone]
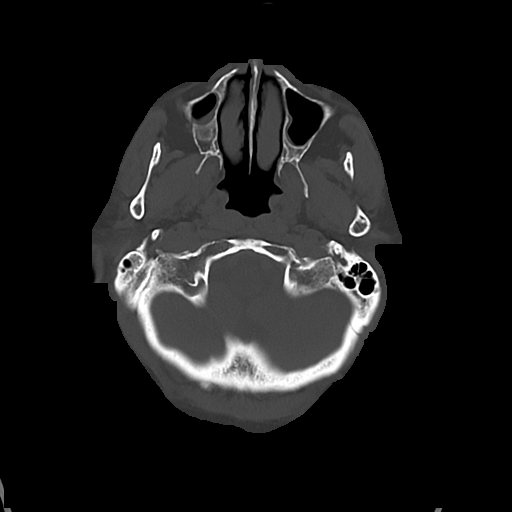
[im 16/77  bone]
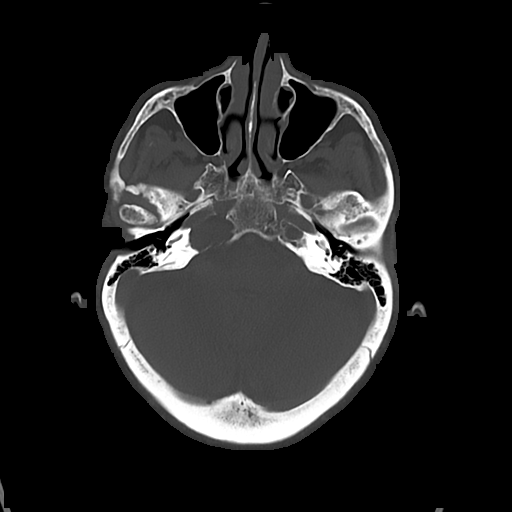
[im 23/77  bone]
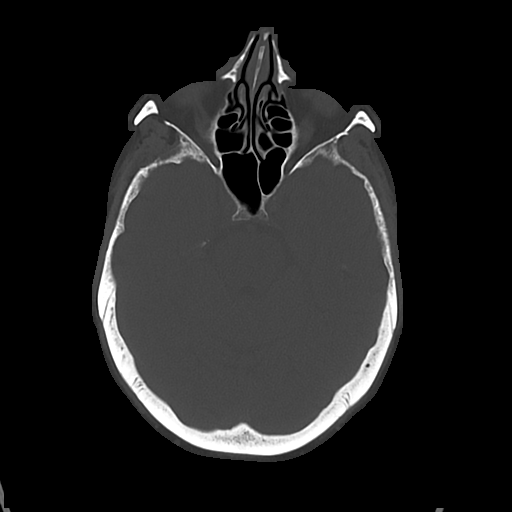
[im 35/77  bone]
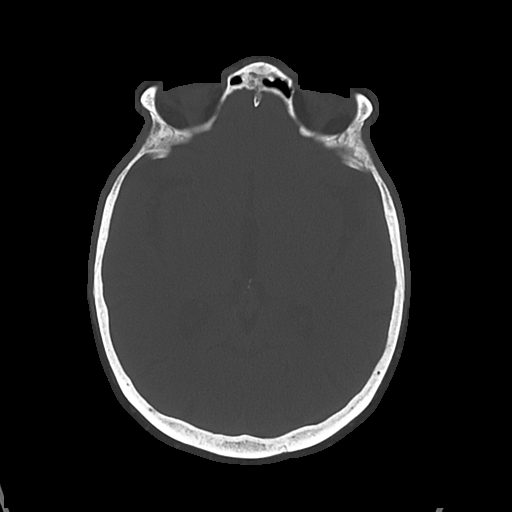

[Series 5: sag soft · sagittal · 0.32mm/px · 3 of 56 slices shown]
[im 19/56  brain]
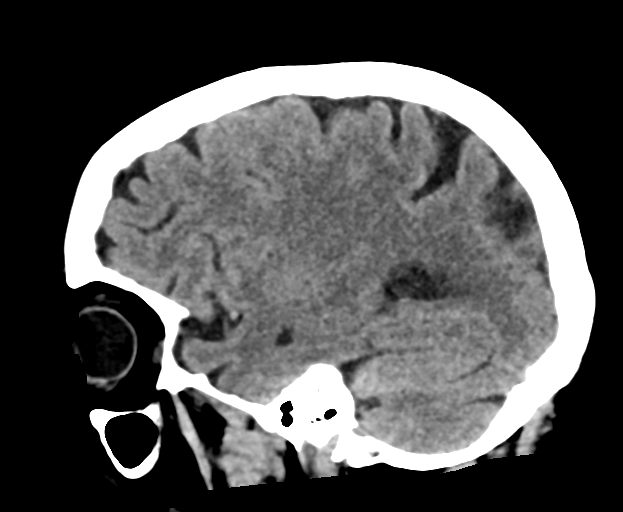
[im 28/56  brain]
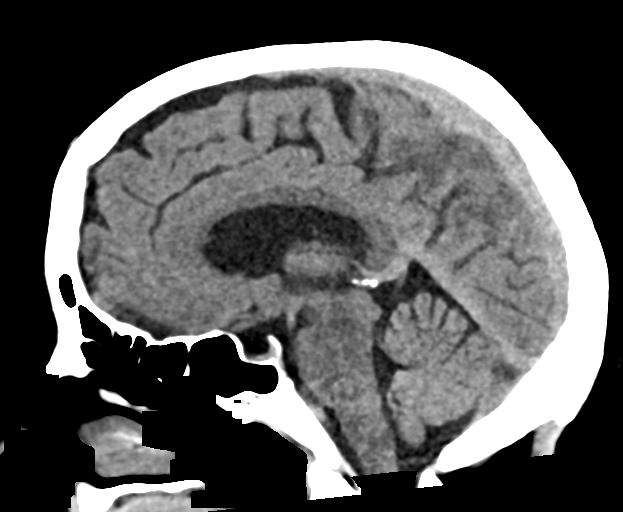
[im 37/56  brain]
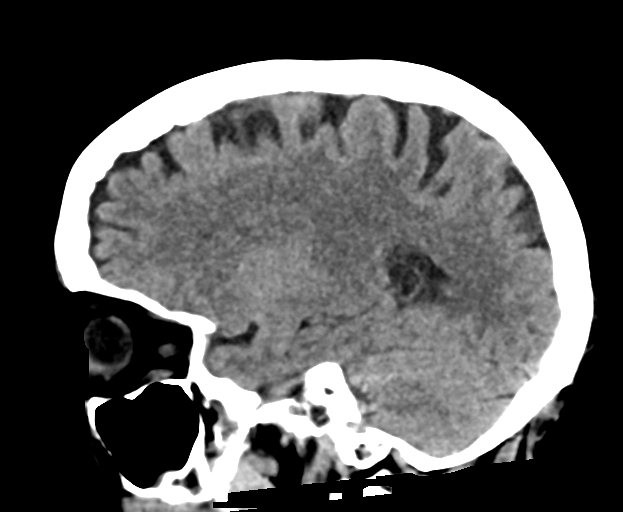

[Series 6: cor soft · coronal · 0.32mm/px · 3 of 66 slices shown]
[im 22/66  brain]
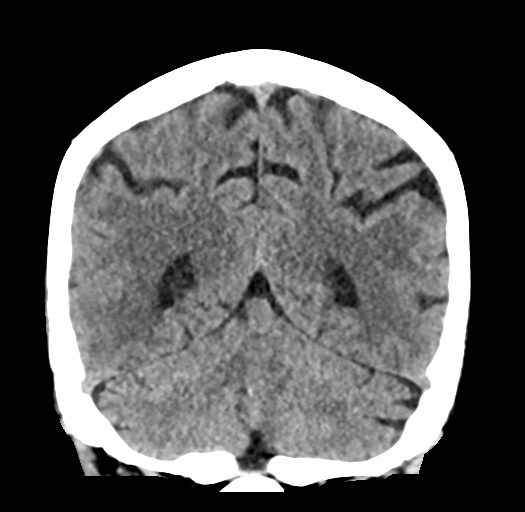
[im 29/66  brain]
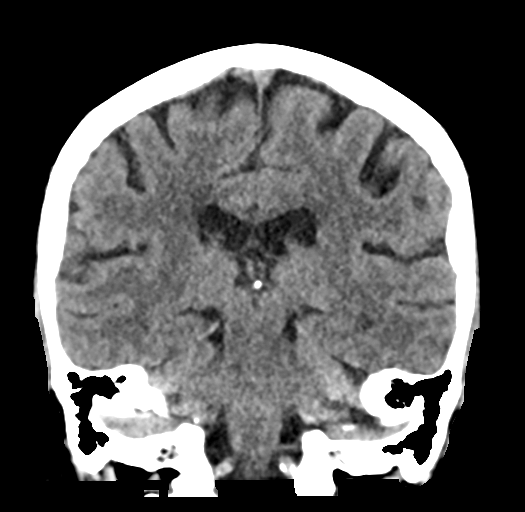
[im 37/66  brain]
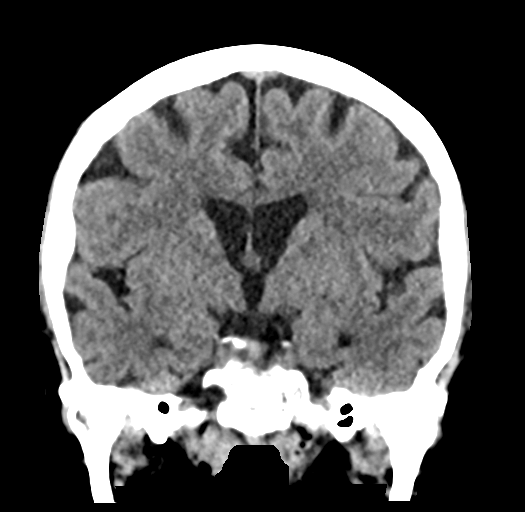

[17 of 47 positions shown; findings below may reference images not displayed]

FINDINGS: Brain: No acute intracranial findings are seen in noncontrast CT
brain. There are no signs of bleeding. Ventricles are not dilated.
Cortical sulci are prominent. There is decreased density in the
periventricular white matter.

Vascular: There are scattered arterial calcifications.

Skull: No fracture is seen in the calvarium.

Sinuses/Orbits: Unremarkable.

Other: None
IMPRESSION: No acute intracranial findings are seen in noncontrast CT brain.
Atrophy. Small-vessel disease.

## 2023-10-19 IMAGING — DX DG PORTABLE PELVIS
1 series · 1 of 1 positions shown · non-contrast
Comparison: None Available.

CLINICAL DATA: Fall on blood thinners. Missed a step at home
falling down stairs.

EXAM:
PORTABLE PELVIS 1-2 VIEWS

[pelvis ap]
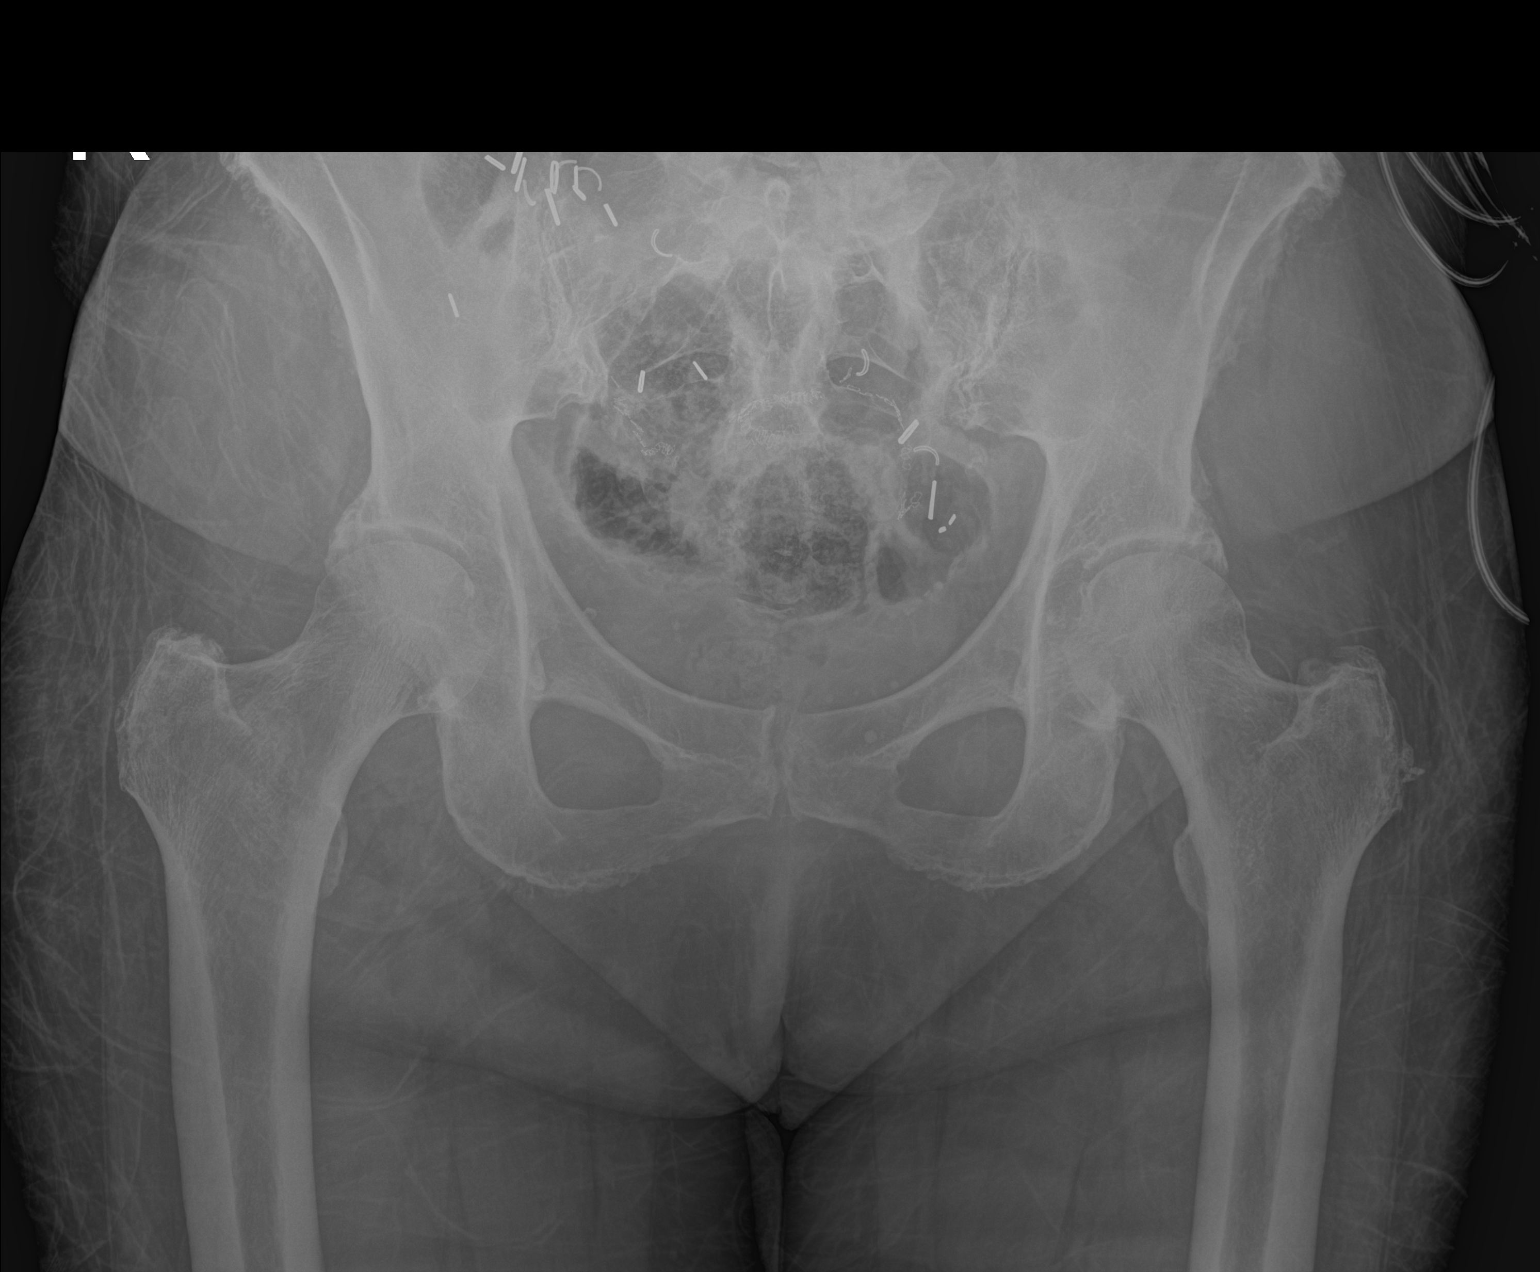

[1 of 1 positions shown; findings below may reference images not displayed]

FINDINGS: The cortical margins of the bony pelvis are intact. No fracture.
Pubic symphysis and sacroiliac joints are congruent. Mild bilateral
hip osteoarthritis. Both femoral heads are well-seated in the
respective acetabula. Multiple surgical clips and enteric sutures in
the pelvis.
IMPRESSION: No pelvic fracture.

## 2023-10-19 IMAGING — DX DG ANKLE COMPLETE 3+V*R*
3 series · 3 of 3 positions shown · non-contrast
Comparison: None Available.

CLINICAL DATA: Fell this morning.  Right ankle pain.

EXAM:
RIGHT ANKLE - COMPLETE 3+ VIEW

[ankle obl]
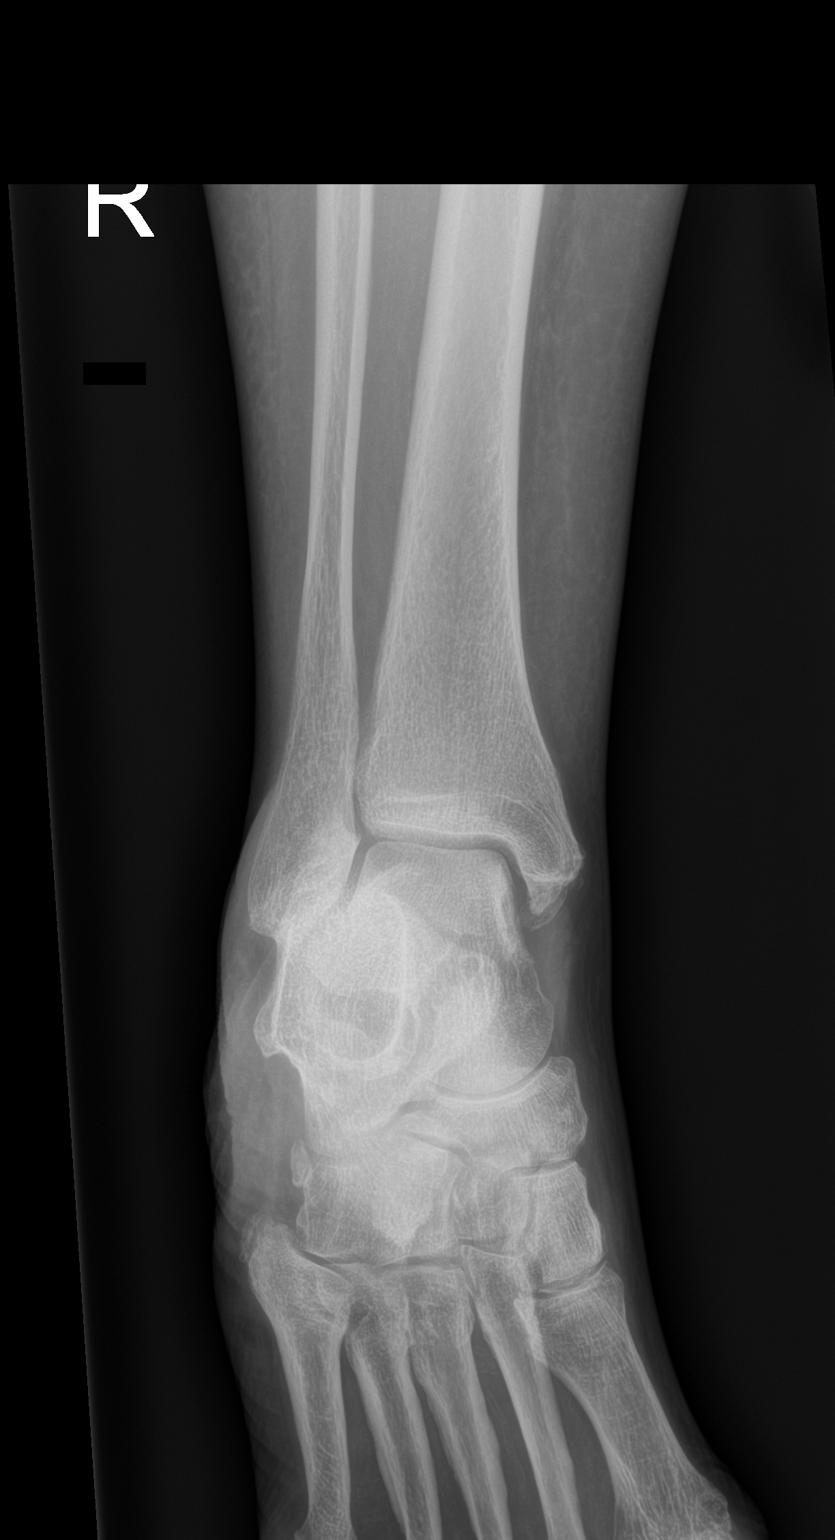

[ankle ap]
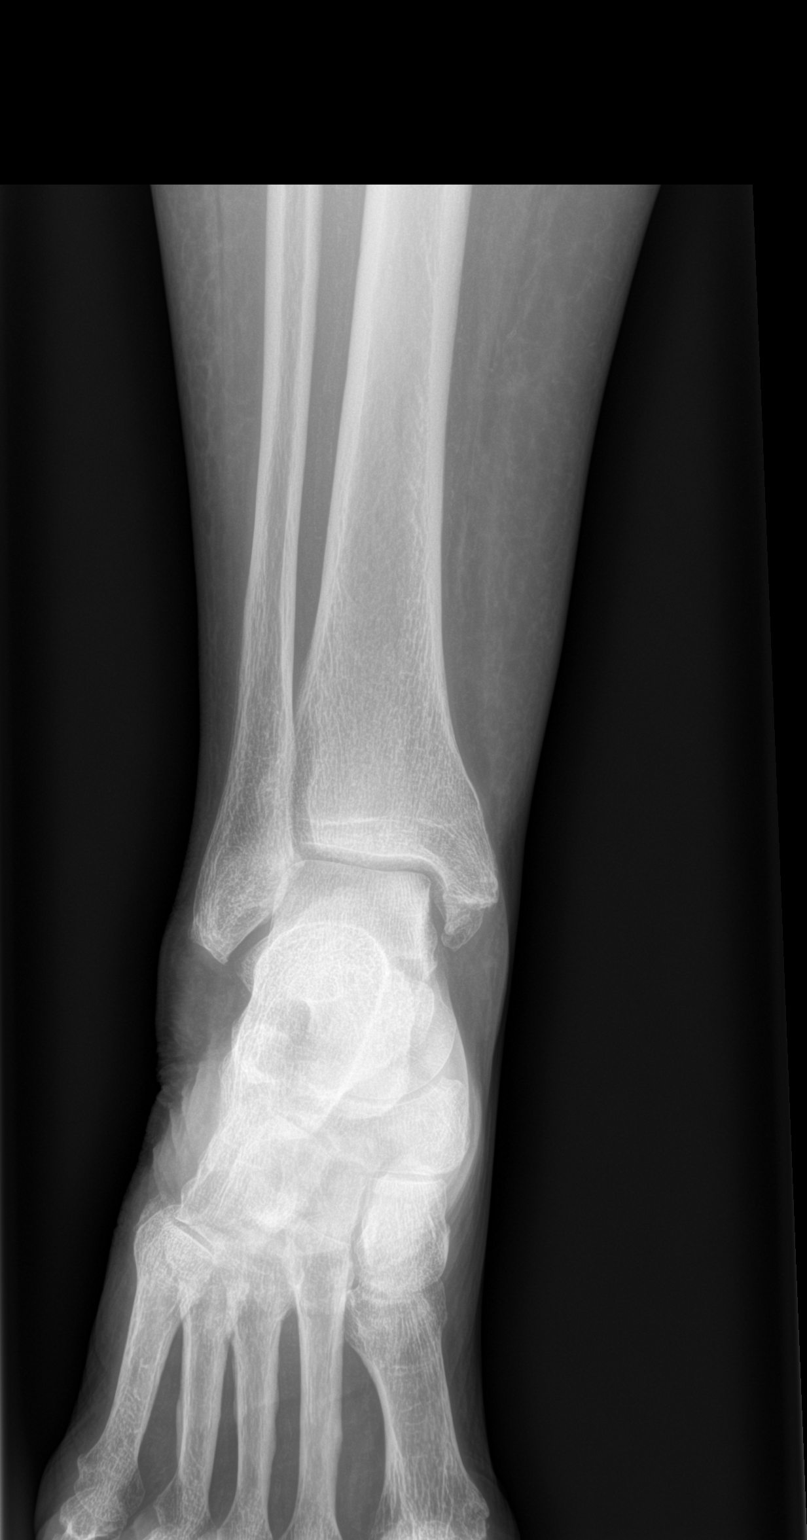

[ankle lat]
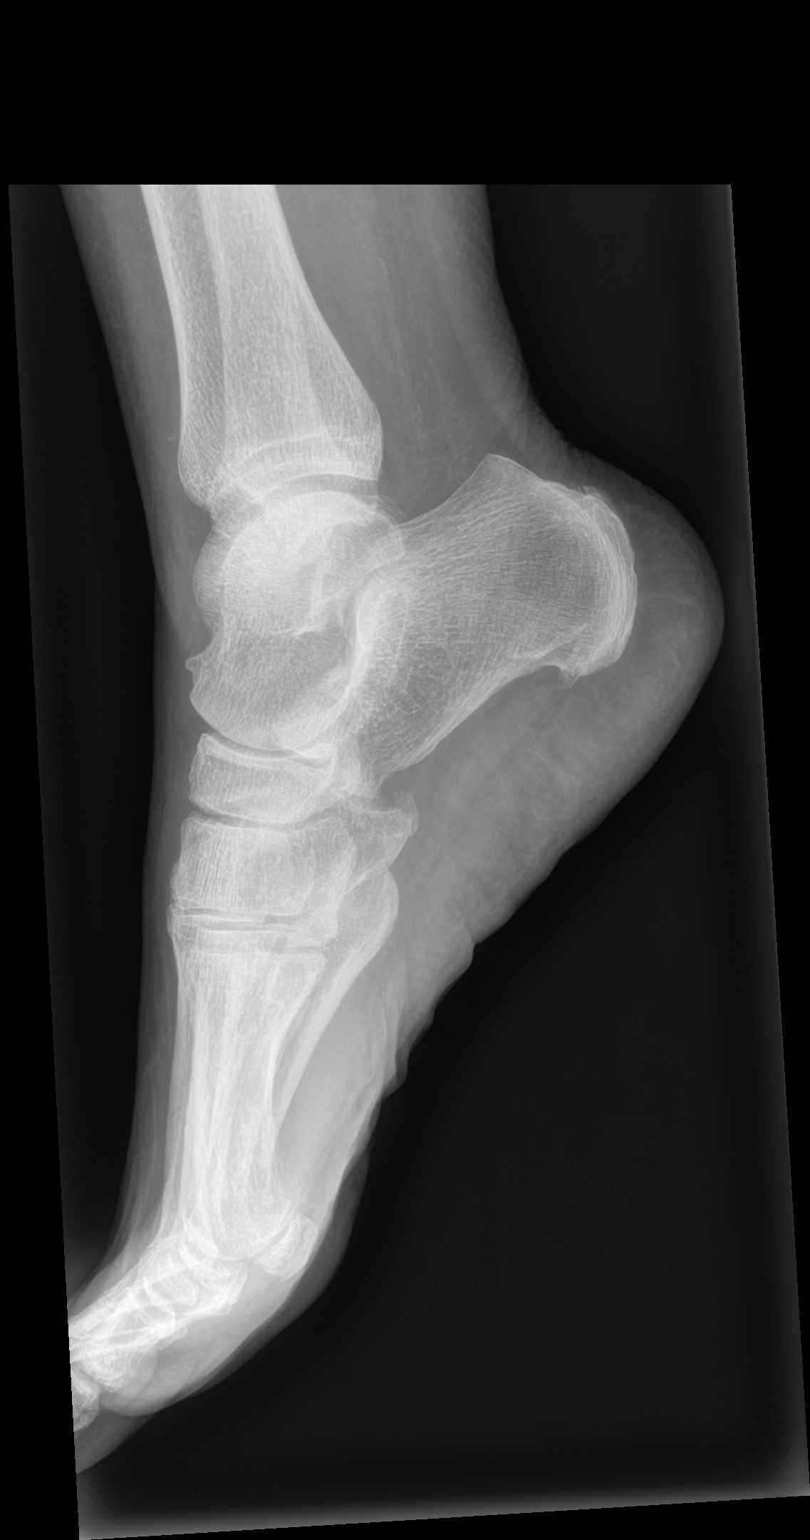

[3 of 3 positions shown; findings below may reference images not displayed]

FINDINGS: The ankle mortise is maintained. No acute ankle fracture. No
osteochondral abnormality or ankle joint effusion. The mid and
hindfoot bony structures are intact. Small calcaneal heel spur is
noted.
IMPRESSION: No acute bony findings.

## 2023-10-19 IMAGING — DX DG ELBOW COMPLETE 3+V*R*
4 series · 4 of 4 positions shown · non-contrast
Comparison: None Available.

CLINICAL DATA: Fall, right elbow pain

EXAM:
RIGHT ELBOW - COMPLETE 3+ VIEW

[elbow ap]
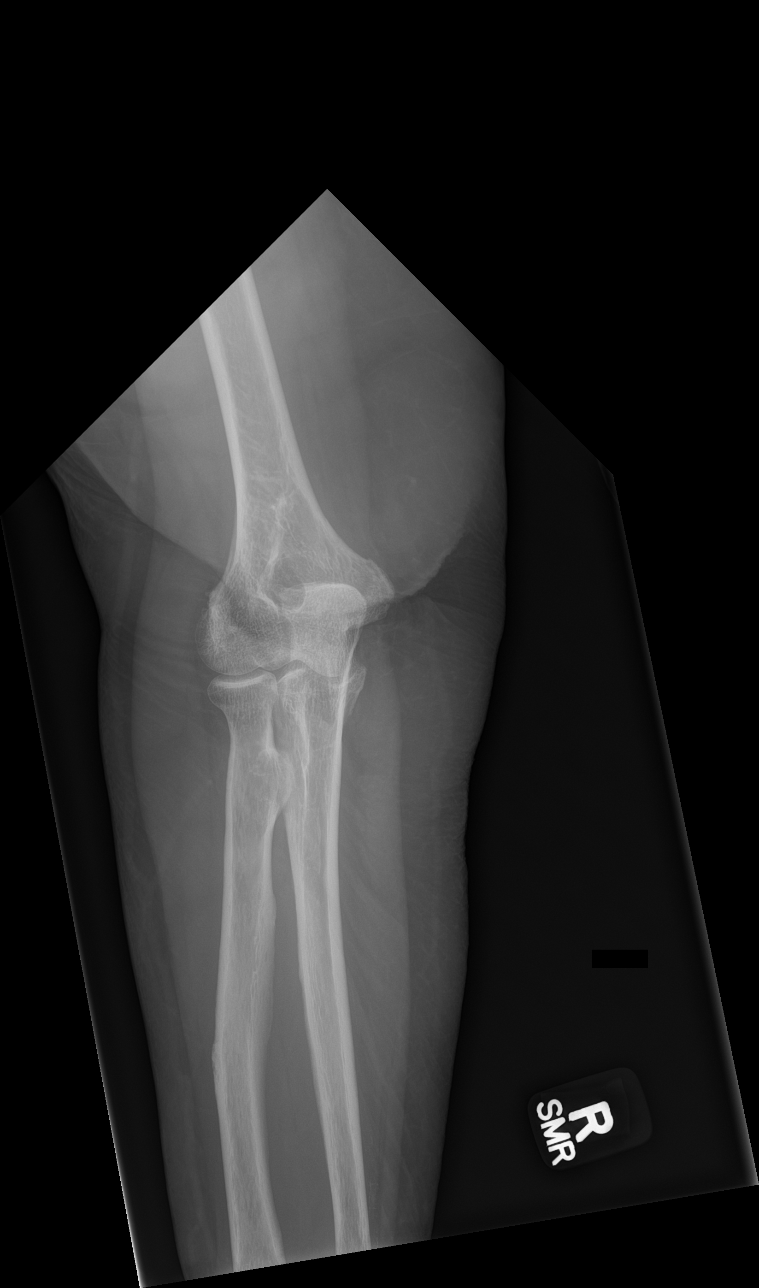

[elbow obl (1 of 2)]
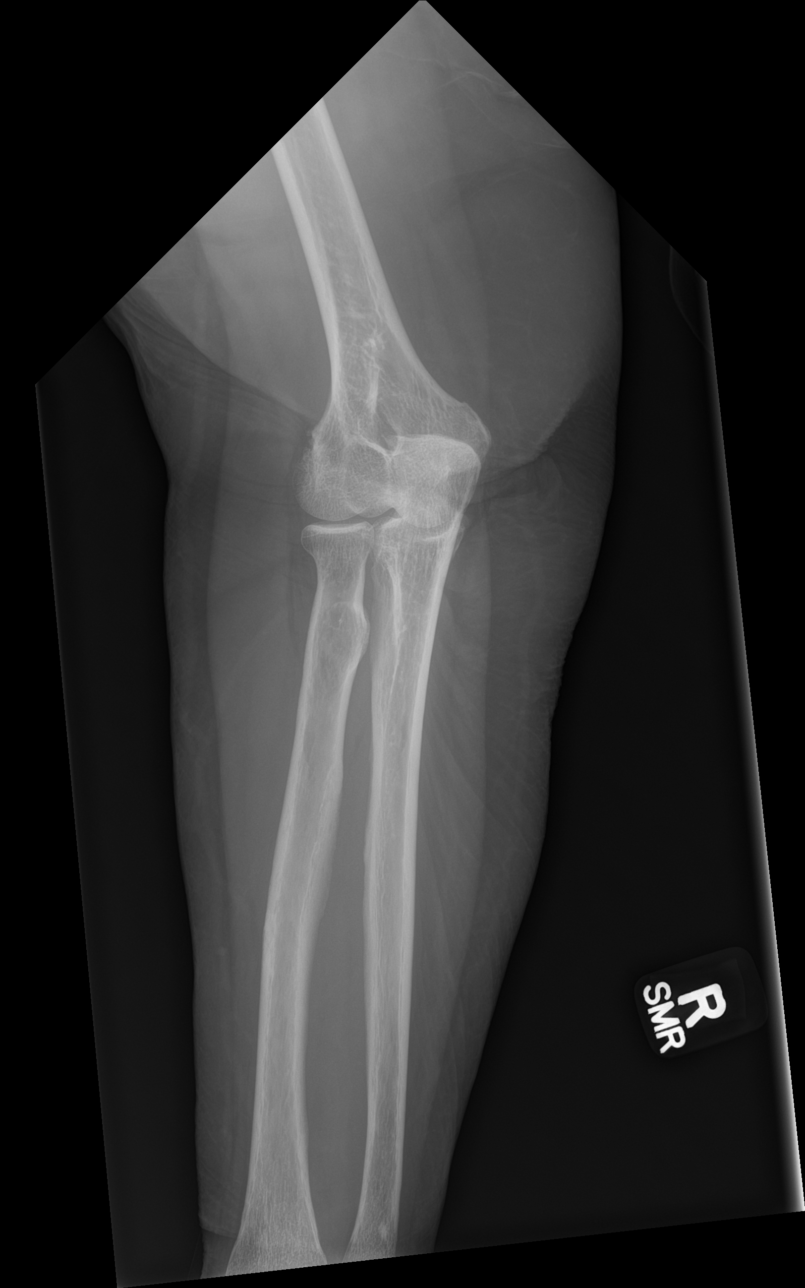

[elbow obl (2 of 2)]
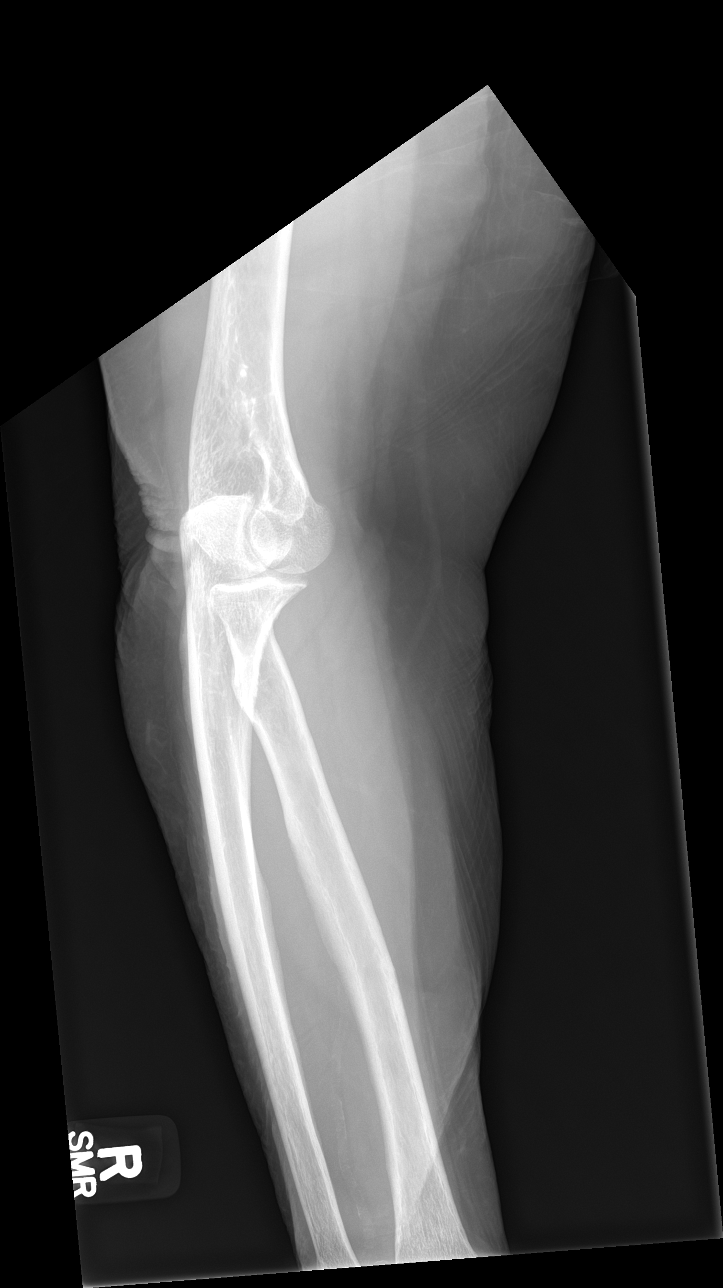

[elbow lat]
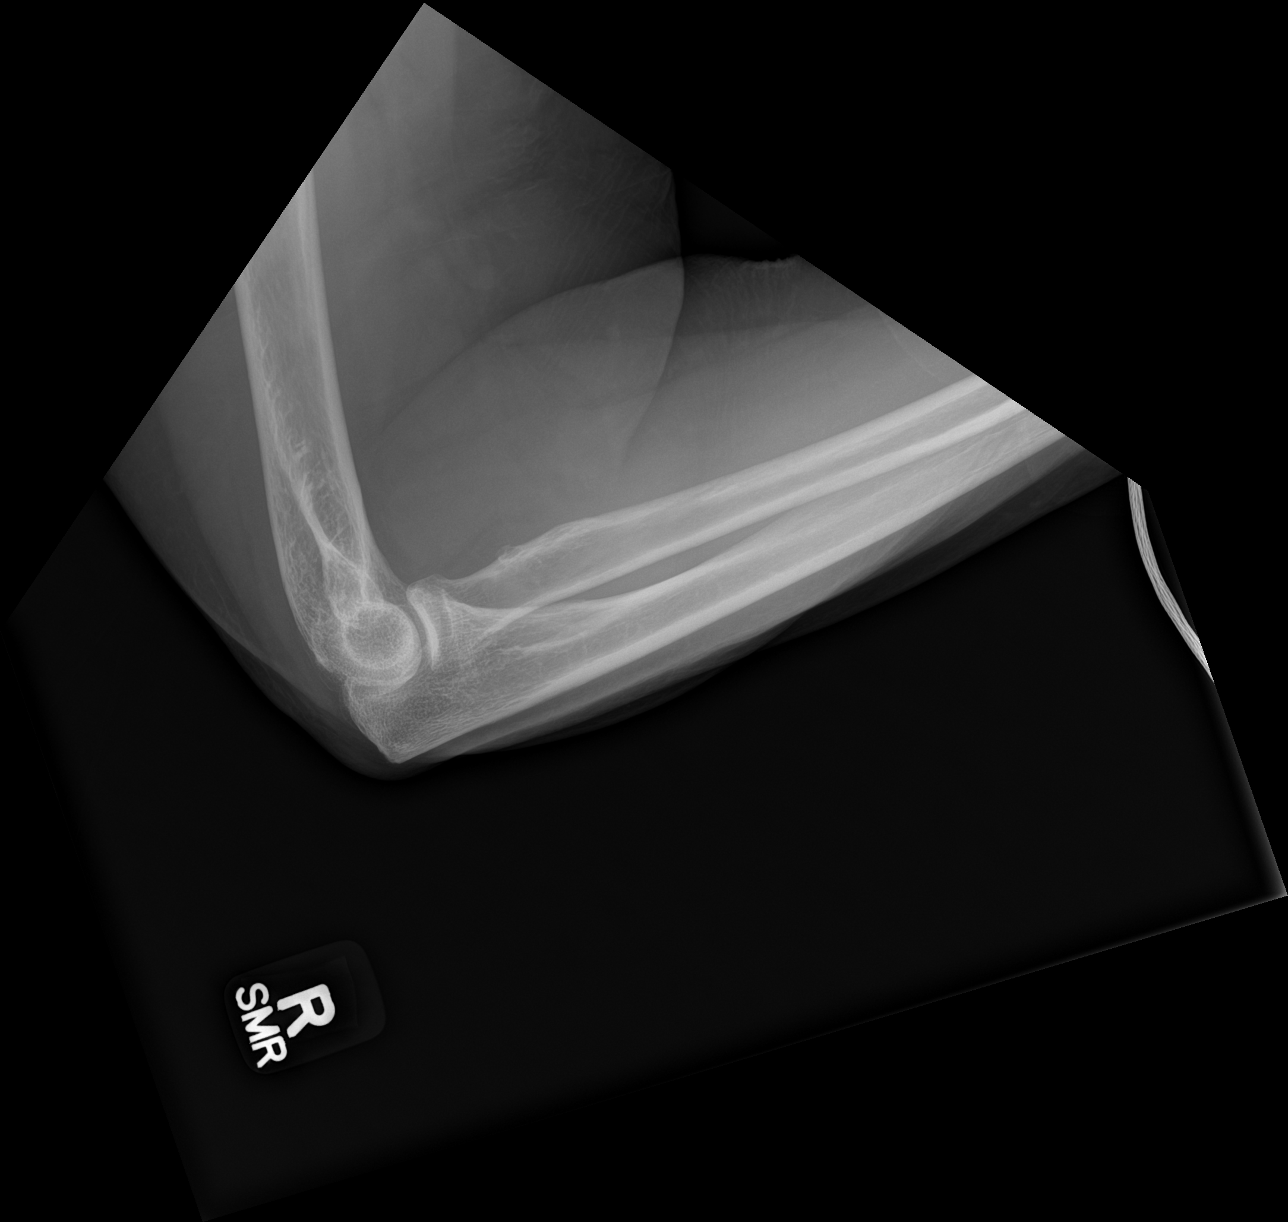

[4 of 4 positions shown; findings below may reference images not displayed]

FINDINGS: Cortical irregularity along the medial aspect of the proximal ulna
seen only on AP view. Mild elevation of the anterior fat pad,
equivocal for effusion. Radial head and neck appear intact. No
malalignment. Mild soft tissue swelling.
IMPRESSION: 1. Cortical irregularity along the medial aspect of the proximal
ulna seen only on AP view, suspicious for nondisplaced fracture.
Correlate with point tenderness.
2. Possible small elbow joint effusion.

## 2023-11-14 ENCOUNTER — Other Ambulatory Visit: Payer: Self-pay | Admitting: Family Medicine

## 2023-11-14 DIAGNOSIS — I480 Paroxysmal atrial fibrillation: Secondary | ICD-10-CM
